# Patient Record
Sex: Female | Born: 1960 | Race: Black or African American | Hispanic: No | Marital: Single | Smoking: Former smoker
Health system: Southern US, Community
[De-identification: ages and names within clinical notes are randomized; demographics above are authoritative.]

## PROBLEM LIST (undated history)

## (undated) DIAGNOSIS — Z789 Other specified health status: Secondary | ICD-10-CM

## (undated) DIAGNOSIS — B20 Human immunodeficiency virus [HIV] disease: Secondary | ICD-10-CM

## (undated) DIAGNOSIS — Z87891 Personal history of nicotine dependence: Secondary | ICD-10-CM

## (undated) DIAGNOSIS — Z21 Asymptomatic human immunodeficiency virus [HIV] infection status: Secondary | ICD-10-CM

## (undated) HISTORY — PX: TUBAL LIGATION: SHX77

---

## 2000-05-03 ENCOUNTER — Encounter: Admission: RE | Admit: 2000-05-03 | Discharge: 2000-05-03 | Payer: Self-pay | Admitting: Internal Medicine

## 2002-06-08 ENCOUNTER — Encounter: Admission: RE | Admit: 2002-06-08 | Discharge: 2002-06-08 | Payer: Self-pay | Admitting: Occupational Medicine

## 2002-06-08 ENCOUNTER — Encounter: Payer: Self-pay | Admitting: Occupational Medicine

## 2002-08-06 ENCOUNTER — Encounter: Admission: RE | Admit: 2002-08-06 | Discharge: 2002-10-09 | Payer: Self-pay | Admitting: *Deleted

## 2002-10-16 ENCOUNTER — Encounter: Admission: RE | Admit: 2002-10-16 | Discharge: 2002-11-02 | Payer: Self-pay | Admitting: Orthopedic Surgery

## 2002-12-10 ENCOUNTER — Emergency Department (HOSPITAL_COMMUNITY): Admission: EM | Admit: 2002-12-10 | Discharge: 2002-12-10 | Payer: Self-pay | Admitting: Emergency Medicine

## 2003-01-22 ENCOUNTER — Other Ambulatory Visit: Admission: RE | Admit: 2003-01-22 | Discharge: 2003-01-22 | Payer: Self-pay | Admitting: Family Medicine

## 2003-01-22 ENCOUNTER — Encounter: Admission: RE | Admit: 2003-01-22 | Discharge: 2003-01-22 | Payer: Self-pay | Admitting: Sports Medicine

## 2003-01-29 ENCOUNTER — Encounter: Admission: RE | Admit: 2003-01-29 | Discharge: 2003-01-29 | Payer: Self-pay | Admitting: Family Medicine

## 2003-02-14 ENCOUNTER — Encounter: Payer: Self-pay | Admitting: Sports Medicine

## 2003-02-14 ENCOUNTER — Encounter: Admission: RE | Admit: 2003-02-14 | Discharge: 2003-02-14 | Payer: Self-pay | Admitting: Sports Medicine

## 2003-03-28 ENCOUNTER — Encounter: Admission: RE | Admit: 2003-03-28 | Discharge: 2003-03-28 | Payer: Self-pay | Admitting: Family Medicine

## 2003-12-19 ENCOUNTER — Ambulatory Visit (HOSPITAL_COMMUNITY): Admission: RE | Admit: 2003-12-19 | Discharge: 2003-12-19 | Payer: Self-pay | Admitting: Family Medicine

## 2003-12-19 ENCOUNTER — Encounter: Admission: RE | Admit: 2003-12-19 | Discharge: 2003-12-19 | Payer: Self-pay | Admitting: Family Medicine

## 2004-01-02 ENCOUNTER — Encounter: Admission: RE | Admit: 2004-01-02 | Discharge: 2004-01-02 | Payer: Self-pay | Admitting: Sports Medicine

## 2004-01-17 ENCOUNTER — Encounter: Admission: RE | Admit: 2004-01-17 | Discharge: 2004-01-17 | Payer: Self-pay | Admitting: Family Medicine

## 2004-01-17 ENCOUNTER — Other Ambulatory Visit: Admission: RE | Admit: 2004-01-17 | Discharge: 2004-01-17 | Payer: Self-pay | Admitting: Family Medicine

## 2004-01-17 ENCOUNTER — Encounter (INDEPENDENT_AMBULATORY_CARE_PROVIDER_SITE_OTHER): Payer: Self-pay | Admitting: Specialist

## 2004-03-03 ENCOUNTER — Encounter: Admission: RE | Admit: 2004-03-03 | Discharge: 2004-03-03 | Payer: Self-pay | Admitting: Family Medicine

## 2004-04-17 ENCOUNTER — Ambulatory Visit: Payer: Self-pay | Admitting: Family Medicine

## 2005-03-02 ENCOUNTER — Encounter (INDEPENDENT_AMBULATORY_CARE_PROVIDER_SITE_OTHER): Payer: Self-pay | Admitting: *Deleted

## 2005-03-02 LAB — CONVERTED CEMR LAB

## 2005-03-23 ENCOUNTER — Encounter: Admission: RE | Admit: 2005-03-23 | Discharge: 2005-03-23 | Payer: Self-pay | Admitting: Sports Medicine

## 2005-03-30 ENCOUNTER — Encounter (INDEPENDENT_AMBULATORY_CARE_PROVIDER_SITE_OTHER): Payer: Self-pay | Admitting: Specialist

## 2005-03-30 ENCOUNTER — Other Ambulatory Visit: Admission: RE | Admit: 2005-03-30 | Discharge: 2005-03-30 | Payer: Self-pay | Admitting: Family Medicine

## 2005-03-30 ENCOUNTER — Ambulatory Visit: Payer: Self-pay | Admitting: Family Medicine

## 2005-04-30 ENCOUNTER — Ambulatory Visit: Payer: Self-pay | Admitting: Family Medicine

## 2006-09-29 DIAGNOSIS — Z87891 Personal history of nicotine dependence: Secondary | ICD-10-CM

## 2006-09-30 ENCOUNTER — Encounter (INDEPENDENT_AMBULATORY_CARE_PROVIDER_SITE_OTHER): Payer: Self-pay | Admitting: *Deleted

## 2006-11-18 ENCOUNTER — Encounter (INDEPENDENT_AMBULATORY_CARE_PROVIDER_SITE_OTHER): Payer: Self-pay | Admitting: Family Medicine

## 2006-11-18 ENCOUNTER — Ambulatory Visit: Payer: Self-pay | Admitting: Family Medicine

## 2006-11-18 LAB — CONVERTED CEMR LAB
ALT: 9 units/L (ref 0–35)
AST: 10 units/L (ref 0–37)
Basophils Absolute: 0 10*3/uL (ref 0.0–0.1)
Basophils Relative: 1 % (ref 0–1)
CO2: 22 meq/L (ref 19–32)
Chloride: 107 meq/L (ref 96–112)
Eosinophils Relative: 2 % (ref 0–5)
H Pylori IgG: NEGATIVE
Lymphocytes Relative: 32 % (ref 12–46)
Neutro Abs: 5.5 10*3/uL (ref 1.7–7.7)
Platelets: 378 10*3/uL (ref 150–400)
RDW: 12.9 % (ref 11.5–14.0)
Sodium: 142 meq/L (ref 135–145)
TSH: 0.529 microintl units/mL (ref 0.350–5.50)
Total Bilirubin: 0.4 mg/dL (ref 0.3–1.2)
Total Protein: 7 g/dL (ref 6.0–8.3)

## 2006-11-21 ENCOUNTER — Telehealth: Payer: Self-pay | Admitting: *Deleted

## 2006-11-22 ENCOUNTER — Encounter: Admission: RE | Admit: 2006-11-22 | Discharge: 2006-11-22 | Payer: Self-pay | Admitting: Sports Medicine

## 2007-04-12 ENCOUNTER — Telehealth: Payer: Self-pay | Admitting: *Deleted

## 2007-05-02 ENCOUNTER — Encounter: Payer: Self-pay | Admitting: Family Medicine

## 2007-05-02 ENCOUNTER — Ambulatory Visit: Payer: Self-pay | Admitting: Family Medicine

## 2007-05-02 LAB — CONVERTED CEMR LAB
ALT: 10 units/L (ref 0–35)
AST: 11 units/L (ref 0–37)
Alkaline Phosphatase: 39 units/L (ref 39–117)
BUN: 7 mg/dL (ref 6–23)
Chloride: 106 meq/L (ref 96–112)
Creatinine, Ser: 0.77 mg/dL (ref 0.40–1.20)
HCT: 38.1 % (ref 36.0–46.0)
MCHC: 34.1 g/dL (ref 30.0–36.0)
Platelets: 364 10*3/uL (ref 150–400)
RDW: 12.8 % (ref 11.5–14.0)
Total Bilirubin: 0.5 mg/dL (ref 0.3–1.2)

## 2007-05-08 ENCOUNTER — Encounter: Admission: RE | Admit: 2007-05-08 | Discharge: 2007-05-08 | Payer: Self-pay | Admitting: Family Medicine

## 2008-04-08 ENCOUNTER — Emergency Department (HOSPITAL_COMMUNITY): Admission: EM | Admit: 2008-04-08 | Discharge: 2008-04-08 | Payer: Self-pay | Admitting: Family Medicine

## 2009-10-03 ENCOUNTER — Ambulatory Visit: Payer: Self-pay | Admitting: Family Medicine

## 2009-10-03 ENCOUNTER — Ambulatory Visit (HOSPITAL_COMMUNITY): Admission: RE | Admit: 2009-10-03 | Discharge: 2009-10-03 | Payer: Self-pay | Admitting: Family Medicine

## 2009-10-03 ENCOUNTER — Encounter: Payer: Self-pay | Admitting: Family Medicine

## 2009-10-03 DIAGNOSIS — K117 Disturbances of salivary secretion: Secondary | ICD-10-CM

## 2009-10-03 DIAGNOSIS — R002 Palpitations: Secondary | ICD-10-CM | POA: Insufficient documentation

## 2009-10-03 LAB — CONVERTED CEMR LAB
ALT: 10 units/L (ref 0–35)
AST: 11 units/L (ref 0–37)
BUN: 9 mg/dL (ref 6–23)
Calcium: 8.7 mg/dL (ref 8.4–10.5)
Creatinine, Ser: 0.68 mg/dL (ref 0.40–1.20)
HCT: 38.9 % (ref 36.0–46.0)
HDL: 50 mg/dL (ref >39)
Hemoglobin: 12.8 g/dL (ref 12.0–15.0)
MCHC: 32.9 g/dL (ref 30.0–36.0)
MCV: 91.7 fL (ref 78.0–100.0)
Platelets: 320 10*3/uL (ref 150–400)
RDW: 12.7 % (ref 11.5–15.5)
TSH: 0.372 microintl units/mL (ref 0.350–4.500)
Total Bilirubin: 0.5 mg/dL (ref 0.3–1.2)
Total CHOL/HDL Ratio: 2.8
VLDL: 17 mg/dL (ref 0–40)

## 2009-10-06 ENCOUNTER — Ambulatory Visit: Payer: Self-pay | Admitting: Family Medicine

## 2009-10-07 ENCOUNTER — Ambulatory Visit: Payer: Self-pay | Admitting: Family Medicine

## 2009-10-07 ENCOUNTER — Encounter: Payer: Self-pay | Admitting: Family Medicine

## 2009-10-13 ENCOUNTER — Ambulatory Visit: Payer: Self-pay | Admitting: Family Medicine

## 2009-10-13 DIAGNOSIS — F438 Other reactions to severe stress: Secondary | ICD-10-CM

## 2009-10-13 DIAGNOSIS — F4389 Other reactions to severe stress: Secondary | ICD-10-CM | POA: Insufficient documentation

## 2009-11-14 ENCOUNTER — Other Ambulatory Visit: Admission: RE | Admit: 2009-11-14 | Discharge: 2009-11-14 | Payer: Self-pay | Admitting: Family Medicine

## 2009-11-14 ENCOUNTER — Ambulatory Visit: Payer: Self-pay | Admitting: Family Medicine

## 2009-11-14 ENCOUNTER — Encounter: Admission: RE | Admit: 2009-11-14 | Discharge: 2009-11-14 | Payer: Self-pay | Admitting: Family Medicine

## 2009-11-19 ENCOUNTER — Encounter: Payer: Self-pay | Admitting: Family Medicine

## 2009-11-19 LAB — CONVERTED CEMR LAB: Pap Smear: NEGATIVE

## 2009-12-12 ENCOUNTER — Encounter: Admission: RE | Admit: 2009-12-12 | Discharge: 2009-12-12 | Payer: Self-pay | Admitting: Family Medicine

## 2010-08-23 ENCOUNTER — Encounter: Payer: Self-pay | Admitting: Family Medicine

## 2010-09-01 NOTE — Assessment & Plan Note (Signed)
Summary: f/u Holter monitor/eo   Vital Signs:  Anita Hoffman profile:   50 year old female Weight:      203.4 pounds Pulse rate:   90 / minute BP sitting:   123 / 84  (right arm)  Vitals Entered By: Renato Battles slade,cma CC: f/up holter monitor and labs Is Anita Hoffman Diabetic? No Pain Assessment Anita Hoffman in pain? no        CC:  f/up holter monitor and labs.  History of Present Illness: Pt here for follow up of last visit for which she was having heart palpitations.  Reports no further episodes, but wore holtor monitor device as instructed with pending results.  Admits to increased stress in home with 3 year old daughter who has one year old and recently discovered her oldest daughter whom also lives at home is pregnant.  Currently works full time at [employer], admits to financial strains and recently having a lot of car trouble.  Feels there has been a loss of support system since fathers death a few years ago.  Anita Hoffman tearful when discussing stressors at home.  Habits & Providers  Alcohol-Tobacco-Diet     Tobacco Status: quit > 6 months  Current Medications (verified): 1)  None  Allergies (verified): No Known Drug Allergies  Past History:  Past Medical History: Reviewed history from 10/03/2009 and no changes required. ankle fracture 5/04, H. pylori neg 5/05,  R intramammary LN, benign  Past Surgical History: Reviewed history from 09/29/2006 and no changes required. Tubal ligation -  Family History: Reviewed history from 10/03/2009 and no changes required. Gma had cervical ca.  No breast or colon ca., No HTN, CAD Sister-Thyroid diseaase Grandmother-DM  Social History: Lives alone with children and grandchild.  One consistant sexual  partner.  FOB's not involved.  Supportive family.  Works for [employer].  Social EtOH.  Quit smoking 1995.  No rec drugs.Smoking Status:  quit > 6 months  Review of Systems General:  Denies loss of appetite, sleep disorder,  weakness, and weight loss. CV:  Denies chest pain or discomfort, difficulty breathing at night, difficulty breathing while lying down, fatigue, lightheadness, palpitations, shortness of breath with exertion, swelling of feet, and swelling of hands. Resp:  Denies cough, shortness of breath, and sputum productive. Psych:  Complains of anxiety and easily tearful; denies mental problems, panic attacks, sense of great danger, suicidal thoughts/plans, and thoughts /plans of harming others.  Physical Exam  General:  In no acute distress; alert,appropriate and cooperative throughout examination Neck:  No deformities, masses, or tenderness noted. Lungs:  Normal respiratory effort, chest expands symmetrically. Lungs are clear to auscultation, no crackles or wheezes. Heart:  Normal rate and regular rhythm. S1 and S2 normal without gallop, murmur, click, rub or other extra sounds. Pulses:  R and L carotid,radial,femoral,dorsalis pedis and posterior tibial pulses are full and equal bilaterally Extremities:  no edema Psych:  Cognition and judgment appear intact. Alert and cooperative with normal attention span and concentration.    Impression & Recommendations:  Problem # 1:  ANXIETY, SITUATIONAL (ICD-308.3)  Given normal lab, ekg and physical exam inclined to believe stress as possible etiology of previous episode of heart palpitations.  Continue however to await holter monitor report.  Discussed and counseled on stress management.  Instructed to return if symptoms return with chest pain, sob and persist.  Orders: [facility]- Est Level  3 (69629)  Orders: [facility]- Est Level  3 (52841)  Problem # 2:  PALPITATIONS, RECURRENT (ICD-785.1)  No episodes  reported since last vist, Holter monitor without ectopy or reported events.  Orders: [facility]- Est Level  3 (35573)  Anita Hoffman Instructions: 1)  Lab results and ekg from previous visit were normal. 2)  Still awaiting Holtor monitor report, we will call and/or mail  result if abnormal. 3)  Please schedule appointment for pap and mammogram. 4)  Relaxation, rest and exercise for stress/anxiety.  Return if symptoms return or worsen.   Prevention & Chronic Care Immunizations   Influenza vaccine: Not documented    Tetanus booster: 01/01/2003: Done.    Pneumococcal vaccine: Not documented  Other Screening   Pap smear: Done.  (03/02/2005)    Mammogram: Done.  (03/02/2005)   Smoking status: quit > 6 months  (10/13/2009)  Lipids   Total Cholesterol: 139  (10/03/2009)   LDL: 72  (10/03/2009)   LDL Direct: Not documented   HDL: 50  (10/03/2009)   Triglycerides: 86  (10/03/2009)      MISC. Report  Procedure date:  10/08/2009  Findings:      Holter monitor-no ectopy, no events

## 2010-09-01 NOTE — Assessment & Plan Note (Signed)
Summary: CPE WITH PAP/KH   Vital Signs:  Patient profile:   50 year old female Height:      62 inches Weight:      201 pounds BMI:     36.90 Temp:     97.7 degrees F oral Pulse rate:   77 / minute BP sitting:   116 / 81  (left arm) Cuff size:   large  Vitals Entered By: Tessie Fass CMA (November 14, 2009 8:56 AM) CC: complete physical with pap Is Patient Diabetic? No Pain Assessment Patient in pain? no        CC:  complete physical with pap.  History of Present Illness: Patient for CPE and Pap.  States mammogram done this am.  Denies complaints at current.  Last visit expressed stress and anxiety which she reports is improved with relaxation and stress relieving measures.  Reports she has began to walk most days a week for exercise. No further episodes of palpitations.   Habits & Providers  Alcohol-Tobacco-Diet     Tobacco Status: quit  Allergies: No Known Drug Allergies  Past History:  Past Medical History: Reviewed history from 10/03/2009 and no changes required. ankle fracture 5/04, H. pylori neg 5/05,  R intramammary LN, benign  Past Surgical History: Reviewed history from 09/29/2006 and no changes required. Tubal ligation -  Family History: Gma had cervical ca.  No breast or colon ca., No HTN, CAD Sister-Thyroid diseaase Grandmother-DM Father-Lung Ca, deceased at 28  Social History: Lives with children and grandchild.  One consistant sexual  partner. FOB's not involved.  Supportive family.  Works for [employer].  Social EtOH.  Quit smoking 1995.  No rec drugs.  Walks for exercise 3-4 days/wkSmoking Status:  quit  Review of Systems General:  Denies chills, fever, loss of appetite, malaise, and sweats. Eyes:  Denies double vision, eye pain, and light sensitivity. ENT:  Denies decreased hearing, earache, nasal congestion, sinus pressure, and sore throat. CV:  Denies chest pain or discomfort, difficulty breathing at night, fatigue, lightheadness,  palpitations, and shortness of breath with exertion. Resp:  Denies chest discomfort, cough, shortness of breath, and sputum productive. GI:  Denies abdominal pain, bloody stools, change in bowel habits, constipation, dark tarry stools, indigestion, nausea, and vomiting. GU:  Denies abnormal vaginal bleeding, discharge, dysuria, urinary frequency, and urinary hesitancy. MS:  Denies joint pain, muscle aches, and muscle weakness. Derm:  Denies changes in color of skin and rash.  Physical Exam  General:  Well-developed,well-nourished,in no acute distress; alert,appropriate and cooperative throughout examination Head:  Normocephalic and atraumatic without obvious abnormalities.  Eyes:  No corneal or conjunctival inflammation noted. EOMI. Perrla. Vision grossly normal. Ears:  External ear exam shows no significant lesions or deformities.  Otoscopic examination reveals clear canals, tympanic membranes are intact bilaterally without bulging, retraction, inflammation or discharge. Hearing is grossly normal bilaterally. Nose:  External nasal examination shows no deformity or inflammation. Nasal mucosa are pink and moist without lesions or exudates. Mouth:  Oral mucosa and oropharynx without lesions or exudates.   Neck:  No deformities, masses, or tenderness noted. Breasts:  skin/areolae normal, no masses, no abnormal thickening, no nipple discharge, and no tenderness.   Lungs:  Normal respiratory effort. Lungs are clear to auscultation, no crackles or wheezes. Heart:  Normal rate and regular rhythm. S1 and S2 normal without gallop, murmur, click, rub or other extra sounds. Abdomen:  Bowel sounds positive,abdomen soft and non-tender without masses, organomegaly or hernias noted. Genitalia:  Normal introitus  for age, no external lesions, no vaginal discharge, mucosa pink and moist, no vaginal or cervical lesions, no vaginal atrophy, no friaility or hemorrhage, normal uterus size and position, no adnexal  masses or tenderness Msk:  No deformity or scoliosis noted of thoracic or lumbar spine.   Pulses:  R and L carotid,radial,femoral,dorsalis pedis and posterior tibial pulses are full and equal bilaterally Extremities:  No clubbing, cyanosis, edema, or deformity noted with normal full range of motion of all joints.   Neurologic:  alert & oriented X3, cranial nerves II-XII intact, strength normal in all extremities, sensation intact to light touch, and gait normal.   Skin:  Intact without suspicious lesions or rashes Cervical Nodes:  No lymphadenopathy noted Axillary Nodes:  No palpable lymphadenopathy Inguinal Nodes:  No significant adenopathy Psych:  Cognition and judgment appear intact. Alert and cooperative with normal attention span and concentration. No apparent delusions, illusions, hallucinations    Impression & Recommendations:  Problem # 1:  EXAMINATION, ROUTINE MEDICAL (ICD-V70.0)  Orders: [facility] - Est  40-64 yrs (41660)  Problem # 2:  SCREENING FOR MALIGNANT NEOPLASM OF THE CERVIX (ICD-V76.2) Await results Pap smear. Orders: Pap Smear-[facility] ([id number]) [facility] - Est  40-64 yrs (23557)  Patient Instructions: 1)  Await Pap results.   Prevention & Chronic Care Immunizations   Influenza vaccine: Not documented    Tetanus booster: 01/01/2003: Done.    Pneumococcal vaccine: Not documented  Other Screening   Pap smear: Done.  (03/02/2005)    Mammogram: Done.  (03/02/2005)   Smoking status: quit  (11/14/2009)  Lipids   Total Cholesterol: 139  (10/03/2009)   LDL: 72  (10/03/2009)   LDL Direct: Not documented   HDL: 50  (10/03/2009)   Triglycerides: 86  (10/03/2009)

## 2010-09-01 NOTE — Assessment & Plan Note (Signed)
Summary: NEEDS HOLTER MONITOR/EO  Nurse Visit Holter moniter applied. Theresia Lo RN  October 06, 2009 4:53 PM    Allergies: No Known Drug Allergies  Orders Added: 1)  Holter Monitor- [facility] [[id number]] 2)  Est Level 1- [facility] [[id number]]

## 2010-09-01 NOTE — Assessment & Plan Note (Signed)
Summary: irregular heartbeats per pt/eo   Vital Signs:  Patient profile:   50 year old female Height:      62 inches Weight:      198 pounds BMI:     36.35 Temp:     98.8 degrees F oral Pulse rate:   81 / minute BP sitting:   115 / 77  (left arm) Cuff size:   large  Vitals Entered By: Tessie Fass CMA (October 03, 2009 10:07 AM) CC: irregular heartbeat Is Patient Diabetic? No Pain Assessment Patient in pain? no        CC:  irregular heartbeat.  History of Present Illness: Describes an irregular heart beat for over one year.  No previous history of such.  Presents as one-two beats that come and go.  She has a heart awareness but no chest pain.  She does get short of breath if she walks for 1-2 miles but recovers easliy.  Dry mouth is a problem, she denies polyuria or polyphagia.  Her sister was just diagnosed with Thyroid disease.  Diabetes in grandmother.  She works for [employer].  Has Medicaid.  Habits & Providers  Alcohol-Tobacco-Diet     Tobacco Status: quit  Current Medications (verified): 1)  None  Allergies (verified): No Known Drug Allergies  Past History:  Past Surgical History: Last updated: 09/29/2006 Tubal ligation -  Family History: Last updated: 10/03/2009 Gma had cervical ca.  No breast or colon ca., No HTN, CAD Sister-Thyroid diseaase Grandmother-DM  Social History: Last updated: 10/03/2009 Lives alone with children.  One consistant sexual  partner.  FOB's not involved.  Supportive family.  Works for [employer].  Social EtOH.  Quit smoking 1995.  No rec drugs.  Risk Factors: Smoking Status: quit (10/03/2009)  Past Medical History: ankle fracture 5/04, H. pylori neg 5/05,  R intramammary LN, benign  Family History: Gma had cervical ca.  No breast or colon ca., No HTN, CAD Sister-Thyroid diseaase Grandmother-DM  Social History: Lives alone with children.  One consistant sexual  partner.  FOB's not involved.  Supportive  family.  Works for [employer].  Social EtOH.  Quit smoking 1995.  No rec drugs.  Review of Systems General:  Denies fatigue, malaise, sleep disorder, sweats, and weight loss. CV:  Complains of shortness of breath with exertion; denies chest pain or discomfort, difficulty breathing while lying down, lightheadness, near fainting, and swelling of feet. Resp:  Denies cough and wheezing.  Physical Exam  General:  alert and overweight-appearing.   Neck:  No deformities, masses, or tenderness noted. Lungs:  normal respiratory effort and normal breath sounds.   Heart:  normal rate, regular rhythm, and no murmur.  EKG with NSR, no ectopy Abdomen:  soft, non-tender, normal bowel sounds, no distention, and no masses.   Extremities:  No clubbing, cyanosis, edema, or deformity noted with normal full range of motion of all joints.     Impression & Recommendations:  Problem # 1:  PALPITATIONS, RECURRENT (ICD-785.1) Check labs, Holter Monitor on Monday 3/7, follow up in 2 weeks Orders: 12 Lead EKG (12 Lead EKG) Lipid-[facility] ([id number]) Comp Met-[facility] (09811-91478) CBC-[facility] (29562) TSH-[facility] ([id number]) [facility]- Est  Level 4 (96295)  Problem # 2:  DRY MOUTH (ICD-527.7) Check for DM, increase water intake  Patient Instructions: 1)  Return on Monday as soon as you can after work for a nurse visit to place Holter monitor. 2)  Please schedule a follow-up appointment in 2 weeks with Rossi Silvestro.Marland Kitchen

## 2010-09-01 NOTE — Assessment & Plan Note (Signed)
Summary: REMOVAL HOLTER MONITOR/[facility]  Nurse Visit Holter monitor removed. On right upper sternum area where tape was placed there is a superfical area 1" by 1/8 '  vertical  abrasion . on left side of sternum reddened area, skin does not appear open.  washed area well with soap and water and dried. applied antibiotic ointment. gave instructions for care of skin that is abrased. advised to call if not healing or appears to becoming infected. will send  message to Luretha Murphy . Theresia Lo RN  October 07, 2009 4:38 PM   Allergies: No Known Drug Allergies  Appended Document: REMOVAL HOLTER MONITOR/[facility]

## 2010-09-01 NOTE — Letter (Signed)
Summary: Generic Letter  [facility]  [address]   [place], [place] [address]   Phone: [phone]  Fax: [phone]    11/19/2009  MORIA BROPHY [address] [place], [place]  [address]  Dear Ms. Mayford Knife,  You PAP smear results were normal.  However, an infection was detected on the testing that will need to be treated.  Please call our office for further details.     Sincerely,   Luretha Murphy NP

## 2013-11-02 ENCOUNTER — Other Ambulatory Visit: Payer: Self-pay

## 2014-06-18 ENCOUNTER — Other Ambulatory Visit (HOSPITAL_COMMUNITY)
Admission: RE | Admit: 2014-06-18 | Discharge: 2014-06-18 | Disposition: A | Discharge status: Home / Self Care | Payer: BC Managed Care – PPO | Source: Ambulatory Visit | Attending: Nurse Practitioner | Admitting: Nurse Practitioner

## 2014-06-18 ENCOUNTER — Other Ambulatory Visit: Payer: Self-pay | Admitting: Nurse Practitioner

## 2014-06-18 DIAGNOSIS — Z113 Encounter for screening for infections with a predominantly sexual mode of transmission: Secondary | ICD-10-CM | POA: Insufficient documentation

## 2014-06-18 DIAGNOSIS — Z01419 Encounter for gynecological examination (general) (routine) without abnormal findings: Secondary | ICD-10-CM | POA: Insufficient documentation

## 2014-06-18 DIAGNOSIS — Z1151 Encounter for screening for human papillomavirus (HPV): Secondary | ICD-10-CM | POA: Insufficient documentation

## 2014-06-19 LAB — CYTOLOGY - PAP

## 2019-11-28 ENCOUNTER — Emergency Department (HOSPITAL_COMMUNITY): Payer: Self-pay

## 2019-11-28 ENCOUNTER — Inpatient Hospital Stay (HOSPITAL_COMMUNITY)
Admission: EM | Admit: 2019-11-28 | Discharge: 2019-12-06 | DRG: 167 | Disposition: A | Discharge status: Home / Self Care | Payer: Self-pay | Attending: Internal Medicine | Admitting: Internal Medicine

## 2019-11-28 ENCOUNTER — Encounter (HOSPITAL_COMMUNITY): Payer: Self-pay | Admitting: *Deleted

## 2019-11-28 DIAGNOSIS — C772 Secondary and unspecified malignant neoplasm of intra-abdominal lymph nodes: Secondary | ICD-10-CM | POA: Diagnosis present

## 2019-11-28 DIAGNOSIS — D6869 Other thrombophilia: Secondary | ICD-10-CM | POA: Diagnosis present

## 2019-11-28 DIAGNOSIS — C799 Secondary malignant neoplasm of unspecified site: Secondary | ICD-10-CM

## 2019-11-28 DIAGNOSIS — D122 Benign neoplasm of ascending colon: Secondary | ICD-10-CM | POA: Diagnosis present

## 2019-11-28 DIAGNOSIS — Z20822 Contact with and (suspected) exposure to covid-19: Secondary | ICD-10-CM | POA: Diagnosis present

## 2019-11-28 DIAGNOSIS — D125 Benign neoplasm of sigmoid colon: Secondary | ICD-10-CM | POA: Diagnosis present

## 2019-11-28 DIAGNOSIS — C787 Secondary malignant neoplasm of liver and intrahepatic bile duct: Secondary | ICD-10-CM | POA: Diagnosis present

## 2019-11-28 DIAGNOSIS — R778 Other specified abnormalities of plasma proteins: Secondary | ICD-10-CM | POA: Diagnosis present

## 2019-11-28 DIAGNOSIS — B20 Human immunodeficiency virus [HIV] disease: Secondary | ICD-10-CM

## 2019-11-28 DIAGNOSIS — K8020 Calculus of gallbladder without cholecystitis without obstruction: Secondary | ICD-10-CM | POA: Diagnosis present

## 2019-11-28 DIAGNOSIS — Z21 Asymptomatic human immunodeficiency virus [HIV] infection status: Secondary | ICD-10-CM | POA: Diagnosis present

## 2019-11-28 DIAGNOSIS — F1290 Cannabis use, unspecified, uncomplicated: Secondary | ICD-10-CM | POA: Diagnosis present

## 2019-11-28 DIAGNOSIS — K5900 Constipation, unspecified: Secondary | ICD-10-CM | POA: Diagnosis present

## 2019-11-28 DIAGNOSIS — E669 Obesity, unspecified: Secondary | ICD-10-CM | POA: Diagnosis present

## 2019-11-28 DIAGNOSIS — Z6832 Body mass index (BMI) 32.0-32.9, adult: Secondary | ICD-10-CM

## 2019-11-28 DIAGNOSIS — Z87891 Personal history of nicotine dependence: Secondary | ICD-10-CM

## 2019-11-28 DIAGNOSIS — F418 Other specified anxiety disorders: Secondary | ICD-10-CM | POA: Diagnosis present

## 2019-11-28 DIAGNOSIS — C801 Malignant (primary) neoplasm, unspecified: Secondary | ICD-10-CM

## 2019-11-28 DIAGNOSIS — Z8041 Family history of malignant neoplasm of ovary: Secondary | ICD-10-CM

## 2019-11-28 DIAGNOSIS — Z801 Family history of malignant neoplasm of trachea, bronchus and lung: Secondary | ICD-10-CM

## 2019-11-28 DIAGNOSIS — R1011 Right upper quadrant pain: Secondary | ICD-10-CM

## 2019-11-28 DIAGNOSIS — I82462 Acute embolism and thrombosis of left calf muscular vein: Secondary | ICD-10-CM | POA: Diagnosis present

## 2019-11-28 DIAGNOSIS — C19 Malignant neoplasm of rectosigmoid junction: Secondary | ICD-10-CM | POA: Diagnosis present

## 2019-11-28 DIAGNOSIS — D490 Neoplasm of unspecified behavior of digestive system: Secondary | ICD-10-CM

## 2019-11-28 DIAGNOSIS — I2699 Other pulmonary embolism without acute cor pulmonale: Secondary | ICD-10-CM | POA: Diagnosis present

## 2019-11-28 DIAGNOSIS — D509 Iron deficiency anemia, unspecified: Secondary | ICD-10-CM | POA: Diagnosis present

## 2019-11-28 DIAGNOSIS — I2609 Other pulmonary embolism with acute cor pulmonale: Principal | ICD-10-CM | POA: Diagnosis present

## 2019-11-28 HISTORY — DX: Personal history of nicotine dependence: Z87.891

## 2019-11-28 HISTORY — DX: Other specified health status: Z78.9

## 2019-11-28 HISTORY — DX: Human immunodeficiency virus (HIV) disease: B20

## 2019-11-28 HISTORY — DX: Asymptomatic human immunodeficiency virus (hiv) infection status: Z21

## 2019-11-28 LAB — BASIC METABOLIC PANEL
Anion gap: 15 (ref 5–15)
BUN: 6 mg/dL (ref 6–20)
CO2: 24 mmol/L (ref 22–32)
Calcium: 8.4 mg/dL — ABNORMAL LOW (ref 8.9–10.3)
Chloride: 94 mmol/L — ABNORMAL LOW (ref 98–111)
Creatinine, Ser: 0.77 mg/dL (ref 0.44–1.00)
GFR calc Af Amer: >60 mL/min (ref >60)
GFR calc non Af Amer: >60 mL/min (ref >60)
Glucose, Bld: 129 mg/dL — ABNORMAL HIGH (ref 70–99)
Potassium: 3.1 mmol/L — ABNORMAL LOW (ref 3.5–5.1)
Sodium: 133 mmol/L — ABNORMAL LOW (ref 135–145)

## 2019-11-28 LAB — CBC
HCT: 28.4 % — ABNORMAL LOW (ref 36.0–46.0)
Hemoglobin: 8.6 g/dL — ABNORMAL LOW (ref 12.0–15.0)
MCH: 23.8 pg — ABNORMAL LOW (ref 26.0–34.0)
MCHC: 30.3 g/dL (ref 30.0–36.0)
MCV: 78.5 fL — ABNORMAL LOW (ref 80.0–100.0)
Platelets: 467 10*3/uL — ABNORMAL HIGH (ref 150–400)
RBC: 3.62 MIL/uL — ABNORMAL LOW (ref 3.87–5.11)
RDW: 15.2 % (ref 11.5–15.5)
WBC: 10.7 10*3/uL — ABNORMAL HIGH (ref 4.0–10.5)
nRBC: 0 % (ref 0.0–0.2)

## 2019-11-28 LAB — TROPONIN I (HIGH SENSITIVITY): Troponin I (High Sensitivity): 13 ng/L (ref <18)

## 2019-11-28 LAB — I-STAT BETA HCG BLOOD, ED (MC, WL, AP ONLY): I-stat hCG, quantitative: <5 m[IU]/mL (ref <5)

## 2019-11-28 MED ORDER — SODIUM CHLORIDE 0.9% FLUSH: 3.0000 mL | Freq: Once | INTRAVENOUS | Status: DC

## 2019-11-28 NOTE — ED Notes (Signed)
No answer x1 for vitals recheck 

## 2019-11-28 NOTE — ED Notes (Signed)
No answer x3

## 2019-11-28 NOTE — ED Notes (Signed)
No answer x2 for vitals recheck 

## 2019-11-28 NOTE — ED Triage Notes (Signed)
To ED for eval of right side cp since last week. States pain has been constant since last week. Nausea today after taking a pain pill pta. States she is very tired and weak since pain started. Difficulty sleeping due to pain. Pt appears pale. Complains of 'a little' sob.

## 2019-11-29 ENCOUNTER — Inpatient Hospital Stay (HOSPITAL_COMMUNITY): Payer: Self-pay

## 2019-11-29 ENCOUNTER — Emergency Department (HOSPITAL_COMMUNITY): Payer: Self-pay

## 2019-11-29 ENCOUNTER — Encounter (HOSPITAL_COMMUNITY): Payer: Self-pay | Admitting: Internal Medicine

## 2019-11-29 ENCOUNTER — Other Ambulatory Visit: Payer: Self-pay

## 2019-11-29 DIAGNOSIS — I2699 Other pulmonary embolism without acute cor pulmonale: Secondary | ICD-10-CM

## 2019-11-29 DIAGNOSIS — R778 Other specified abnormalities of plasma proteins: Secondary | ICD-10-CM | POA: Diagnosis present

## 2019-11-29 DIAGNOSIS — D490 Neoplasm of unspecified behavior of digestive system: Secondary | ICD-10-CM | POA: Diagnosis present

## 2019-11-29 DIAGNOSIS — I2609 Other pulmonary embolism with acute cor pulmonale: Secondary | ICD-10-CM

## 2019-11-29 DIAGNOSIS — C787 Secondary malignant neoplasm of liver and intrahepatic bile duct: Secondary | ICD-10-CM | POA: Diagnosis present

## 2019-11-29 LAB — COMPREHENSIVE METABOLIC PANEL
ALT: 21 U/L (ref 0–44)
AST: 56 U/L — ABNORMAL HIGH (ref 15–41)
Albumin: 2.5 g/dL — ABNORMAL LOW (ref 3.5–5.0)
Alkaline Phosphatase: 176 U/L — ABNORMAL HIGH (ref 38–126)
Anion gap: 15 (ref 5–15)
BUN: 7 mg/dL (ref 6–20)
CO2: 21 mmol/L — ABNORMAL LOW (ref 22–32)
Calcium: 8.1 mg/dL — ABNORMAL LOW (ref 8.9–10.3)
Chloride: 97 mmol/L — ABNORMAL LOW (ref 98–111)
Creatinine, Ser: 0.61 mg/dL (ref 0.44–1.00)
GFR calc Af Amer: >60 mL/min (ref >60)
GFR calc non Af Amer: >60 mL/min (ref >60)
Glucose, Bld: 100 mg/dL — ABNORMAL HIGH (ref 70–99)
Potassium: 3.8 mmol/L (ref 3.5–5.1)
Sodium: 133 mmol/L — ABNORMAL LOW (ref 135–145)
Total Bilirubin: 1.2 mg/dL (ref 0.3–1.2)
Total Protein: 7.2 g/dL (ref 6.5–8.1)

## 2019-11-29 LAB — CBC WITH DIFFERENTIAL/PLATELET
Abs Immature Granulocytes: 0.05 10*3/uL (ref 0.00–0.07)
Basophils Absolute: 0 10*3/uL (ref 0.0–0.1)
Basophils Relative: 0 %
Eosinophils Absolute: 0.1 10*3/uL (ref 0.0–0.5)
Eosinophils Relative: 1 %
HCT: 28.1 % — ABNORMAL LOW (ref 36.0–46.0)
Hemoglobin: 8.4 g/dL — ABNORMAL LOW (ref 12.0–15.0)
Immature Granulocytes: 1 %
Lymphocytes Relative: 22 %
Lymphs Abs: 2.5 10*3/uL (ref 0.7–4.0)
MCH: 24.1 pg — ABNORMAL LOW (ref 26.0–34.0)
MCHC: 29.9 g/dL — ABNORMAL LOW (ref 30.0–36.0)
MCV: 80.7 fL (ref 80.0–100.0)
Monocytes Absolute: 0.9 10*3/uL (ref 0.1–1.0)
Monocytes Relative: 8 %
Neutro Abs: 7.5 10*3/uL (ref 1.7–7.7)
Neutrophils Relative %: 68 %
Platelets: 464 10*3/uL — ABNORMAL HIGH (ref 150–400)
RBC: 3.48 MIL/uL — ABNORMAL LOW (ref 3.87–5.11)
RDW: 15.7 % — ABNORMAL HIGH (ref 11.5–15.5)
WBC: 11 10*3/uL — ABNORMAL HIGH (ref 4.0–10.5)
nRBC: 0 % (ref 0.0–0.2)

## 2019-11-29 LAB — ECHOCARDIOGRAM COMPLETE
Height: 62 in
Weight: 3360 oz

## 2019-11-29 LAB — HEPARIN LEVEL (UNFRACTIONATED): Heparin Unfractionated: 0.16 IU/mL — ABNORMAL LOW (ref 0.30–0.70)

## 2019-11-29 LAB — LIPASE, BLOOD: Lipase: 17 U/L (ref 11–51)

## 2019-11-29 LAB — HEPATIC FUNCTION PANEL
ALT: 24 U/L (ref 0–44)
AST: 59 U/L — ABNORMAL HIGH (ref 15–41)
Albumin: 2.6 g/dL — ABNORMAL LOW (ref 3.5–5.0)
Alkaline Phosphatase: 175 U/L — ABNORMAL HIGH (ref 38–126)
Bilirubin, Direct: 0.2 mg/dL (ref 0.0–0.2)
Indirect Bilirubin: 1 mg/dL — ABNORMAL HIGH (ref 0.3–0.9)
Total Bilirubin: 1.2 mg/dL (ref 0.3–1.2)
Total Protein: 7.6 g/dL (ref 6.5–8.1)

## 2019-11-29 LAB — TROPONIN I (HIGH SENSITIVITY): Troponin I (High Sensitivity): 25 ng/L — ABNORMAL HIGH (ref <18)

## 2019-11-29 LAB — HIV ANTIBODY (ROUTINE TESTING W REFLEX): HIV Screen 4th Generation wRfx: REACTIVE — AB

## 2019-11-29 LAB — RESPIRATORY PANEL BY RT PCR (FLU A&B, COVID)
Influenza A by PCR: NEGATIVE
Influenza B by PCR: NEGATIVE
SARS Coronavirus 2 by RT PCR: NEGATIVE

## 2019-11-29 MED ORDER — HEPARIN (PORCINE) 25000 UT/250ML-% IV SOLN
1750.0000 [IU]/h | INTRAVENOUS | Status: DC
  Administered 2019-11-29: 1200 [IU]/h via INTRAVENOUS
  Administered 2019-11-30: 1450 [IU]/h via INTRAVENOUS
  Administered 2019-11-30: 1750 [IU]/h via INTRAVENOUS
  Filled 2019-11-29 (×3): qty 250

## 2019-11-29 MED ORDER — HYDROMORPHONE HCL 1 MG/ML IJ SOLN: 0.5000 mg | INTRAMUSCULAR | Status: DC | PRN

## 2019-11-29 MED ORDER — HEPARIN BOLUS VIA INFUSION
4000.0000 [IU] | Freq: Once | INTRAVENOUS | Status: AC
  Administered 2019-11-29: 4000 [IU] via INTRAVENOUS
  Filled 2019-11-29: qty 4000

## 2019-11-29 MED ORDER — ONDANSETRON HCL 4 MG/2ML IJ SOLN
4.0000 mg | Freq: Once | INTRAMUSCULAR | Status: AC
  Administered 2019-11-29: 08:00:00 4 mg via INTRAVENOUS
  Filled 2019-11-29: qty 2

## 2019-11-29 MED ORDER — SODIUM CHLORIDE 0.9 % IV BOLUS
1000.0000 mL | Freq: Once | INTRAVENOUS | Status: AC
  Administered 2019-11-29: 1000 mL via INTRAVENOUS

## 2019-11-29 MED ORDER — IOHEXOL 300 MG/ML  SOLN
100.0000 mL | Freq: Once | INTRAMUSCULAR | Status: AC | PRN
  Administered 2019-11-29: 100 mL via INTRAVENOUS

## 2019-11-29 MED ORDER — ONDANSETRON HCL 4 MG PO TABS: 4.0000 mg | ORAL_TABLET | Freq: Four times a day (QID) | ORAL | Status: DC | PRN

## 2019-11-29 MED ORDER — SODIUM CHLORIDE 0.9 % IV SOLN: INTRAVENOUS | Status: DC

## 2019-11-29 MED ORDER — ACETAMINOPHEN 650 MG RE SUPP: 650.0000 mg | Freq: Four times a day (QID) | RECTAL | Status: DC | PRN

## 2019-11-29 MED ORDER — ONDANSETRON HCL 4 MG/2ML IJ SOLN: 4.0000 mg | Freq: Four times a day (QID) | INTRAMUSCULAR | Status: DC | PRN

## 2019-11-29 MED ORDER — HEPARIN BOLUS VIA INFUSION
2100.0000 [IU] | Freq: Once | INTRAVENOUS | Status: AC
  Administered 2019-11-29: 2100 [IU] via INTRAVENOUS
  Filled 2019-11-29: qty 2100

## 2019-11-29 MED ORDER — ACETAMINOPHEN 325 MG PO TABS
650.0000 mg | ORAL_TABLET | Freq: Four times a day (QID) | ORAL | Status: DC | PRN
  Administered 2019-12-01 – 2019-12-06 (×4): 650 mg via ORAL
  Filled 2019-11-29 (×4): qty 2

## 2019-11-29 MED ORDER — MORPHINE SULFATE (PF) 4 MG/ML IV SOLN
4.0000 mg | Freq: Once | INTRAVENOUS | Status: AC
  Administered 2019-11-29: 4 mg via INTRAVENOUS
  Filled 2019-11-29: qty 1

## 2019-11-29 NOTE — ED Notes (Signed)
Patient transported to US 

## 2019-11-29 NOTE — Progress Notes (Signed)
Patient received from the ED. Alert and oriented X4. Heparin drip infusing for bilateral PE's. Lower extremity ultrasound results noted. SCD's and ted hose placed on. VSS. Oriented patient to unit and procedures. Patient denies any pain. No acute needs voiced at this time, call bell in reach.

## 2019-11-29 NOTE — H&P (Addendum)
History and Physical    Anita Hoffman [id number] DOB: 1960-12-13 DOA: 11/28/2019  PCP: Patient, No Pcp Per  Patient coming from: Home I have personally briefly reviewed patient's old medical records in [place]  Chief Complaint: Right upper quadrant pain and shortness of breath since 3 weeks  HPI: Anita Hoffman is a 59 y.o. female with medical history significant of former smoker presents to emergency department due to worsening right upper quadrant pain and shortness of breath since 3 weeks.  Patient tells me that she has chronic right upper quadrant pain since many years however it got worse since 3 weeks associated with food intake and nausea.  Reports that her pain is nonradiating, cramping in nature.  She has noticed more than 30 pound weight loss in 1 month associated with night sweats.  Reports constipation since 3 weeks, no history of melena, fever, chills, pruritus, jaundice, decreased appetite.  Reports shortness of breath however denies association with leg swelling, orthopnea, PND, chest pain, wheezing, cough, congestion.  She does not have any medical issues and does not take any medications at home.  She is a former smoker and quit smoking 14 years ago, no history of alcohol, illicit drug use.  ED Course: Upon arrival to ED: Patient tachycardic: Heart rate in 103, tachypneic, respiratory rate in 20s, maintaining oxygen saturation on room air, afebrile.  Troponin: 25, lipase, hepatic panel, COVID-19: WNL.  CBC and CMP: Pending.  Right upper quadrant ultrasound shows moderate to severe cholelithiasis with mild gallbladder wall thickness of 5.5 mm.  Recommend clinical correlation as findings could be seen with acute cholecystitis.  Numerous liver masses with largest over the right lobe measuring 7.3 cm containing central cystic/necrotic areas.  Findings are highly suspicious for metastatic disease.  CT angio of the chest done which shows bilateral PE right more than  left.  CT abdomen shows: Large cecal mass extending into ileocecal valve region and terminal ileum measuring 5.6 x 4.0 x 4.5 cm consistent with colonic neoplasm. EDP consulted general surgery.  [facility] hospital[facility] consulted for admission for acute bilateral PE and metastatic cecal mass. Review of Systems: As per HPI otherwise negative.    Past Medical History:  Diagnosis Date  . Former smoker     History reviewed. No pertinent surgical history.  Former smoker, denies current smoking, alcohol, illicit drug use.  No Known Allergies  Family history: Positive for ovarian cancer in grandmom.  Prior to Admission medications   Not on File    Physical Exam: Vitals:   11/29/19 0930 11/29/19 1000 11/29/19 1030 11/29/19 1100  BP: 111/83 113/82 115/80 112/78  Pulse: 86 91 87 87  Resp: (!) 23 (!) 22 20 (!) 23  Temp:      TempSrc:      SpO2: 95% 93% 97% 93%  Weight:      Height:        Constitutional: NAD, calm, comfortable, communicating well, on room air Eyes: PERRL, lids and conjunctivae normal ENMT: Mucous membranes are moist. Posterior pharynx clear of any exudate or lesions.Normal dentition.  Neck: normal, supple, no masses, no thyromegaly Respiratory: clear to auscultation bilaterally, no wheezing, no crackles. Normal respiratory effort. No accessory muscle use.  Cardiovascular: Regular rate and rhythm, no murmurs / rubs / gallops. No extremity edema. 2+ pedal pulses. No carotid bruits.  Abdomen: Right upper quadrant tenderness positive, no masses palpated. No hepatosplenomegaly. Bowel sounds positive.  Musculoskeletal: no clubbing / cyanosis. No joint deformity upper and lower extremities.  Good ROM, no contractures. Normal muscle tone.  Skin: no rashes, lesions, ulcers. No induration Neurologic: CN 2-12 grossly intact. Sensation intact, DTR normal. Strength 5/5 in all 4.  Psychiatric: Normal judgment and insight. Alert and oriented x 3. Normal mood.    Labs on Admission: I  have personally reviewed following labs and imaging studies  CBC: Recent Labs  Lab 11/28/19 1309  WBC 10.7*  HGB 8.6*  HCT 28.4*  MCV 78.5*  PLT 0000000*   Basic Metabolic Panel: Recent Labs  Lab 11/28/19 1309  NA 133*  K 3.1*  CL 94*  CO2 24  GLUCOSE 129*  BUN 6  CREATININE 0.77  CALCIUM 8.4*   GFR: Estimated Creatinine Clearance: 83.5 mL/min (by C-G formula based on SCr of 0.77 mg/dL). Liver Function Tests: Recent Labs  Lab 11/29/19 0732  AST 59*  ALT 24  ALKPHOS 175*  BILITOT 1.2  PROT 7.6  ALBUMIN 2.6*   Recent Labs  Lab 11/29/19 0732  LIPASE 17   No results for input(s): AMMONIA in the last 168 hours. Coagulation Profile: No results for input(s): INR, PROTIME in the last 168 hours. Cardiac Enzymes: No results for input(s): CKTOTAL, CKMB, CKMBINDEX, TROPONINI in the last 168 hours. BNP (last 3 results) No results for input(s): PROBNP in the last 8760 hours. HbA1C: No results for input(s): HGBA1C in the last 72 hours. CBG: No results for input(s): GLUCAP in the last 168 hours. Lipid Profile: No results for input(s): CHOL, HDL, LDLCALC, TRIG, CHOLHDL, LDLDIRECT in the last 72 hours. Thyroid Function Tests: No results for input(s): TSH, T4TOTAL, FREET4, T3FREE, THYROIDAB in the last 72 hours. Anemia Panel: No results for input(s): VITAMINB12, FOLATE, FERRITIN, TIBC, IRON, RETICCTPCT in the last 72 hours. Urine analysis: No results found for: COLORURINE, APPEARANCEUR, LABSPEC, PHURINE, GLUCOSEU, HGBUR, BILIRUBINUR, KETONESUR, PROTEINUR, UROBILINOGEN, NITRITE, LEUKOCYTESUR  Radiological Exams on Admission: DG Chest 2 View  Result Date: 11/28/2019 CLINICAL DATA:  59 year old female with history of chest and abdominal pain for the past 2 weeks. EXAM: CHEST - 2 VIEW COMPARISON:  No priors. FINDINGS: Lung volumes are normal. No consolidative airspace disease. No pleural effusions. No pneumothorax. No pulmonary nodule or mass noted. Pulmonary vasculature and  the cardiomediastinal silhouette are within normal limits. IMPRESSION: No radiographic evidence of acute cardiopulmonary disease. Electronically Signed   By: Vinnie Langton M.D.   On: 11/28/2019 13:46   CT Angio Chest PE W/Cm &/Or Wo Cm  Result Date: 11/29/2019 CLINICAL DATA:  RIGHT upper quadrant and RIGHT lower chest pain, shortness of breath, lower extremity edema, former smoker EXAM: CT ANGIOGRAPHY CHEST CT ABDOMEN AND PELVIS WITH CONTRAST TECHNIQUE: Multidetector CT imaging of the chest was performed using the standard protocol during bolus administration of intravenous contrast. Multiplanar CT image reconstructions and MIPs were obtained to evaluate the vascular anatomy. Multidetector CT imaging of the abdomen and pelvis was performed using the standard protocol during bolus administration of intravenous contrast. CONTRAST:  133mL OMNIPAQUE IOHEXOL 300 MG/ML SOLN IV. No oral contrast. COMPARISON:  CT abdomen and pelvis 11/22/2006 FINDINGS: CTA CHEST FINDINGS Cardiovascular: Aorta normal caliber without aneurysm or dissection. Pulmonary arteries well opacified. Filling defects are seen within the pulmonary arteries bilaterally RIGHT greater than LEFT consistent with pulmonary emboli. These are greatest in the RIGHT lower lobe but affect all remaining lobes as well. RV/LV ratio = 1.0, elevated, consistent with RIGHT heart strain. Mediastinum/Nodes: Base of cervical region normal appearance. Normal sized axillary nodes bilaterally. No thoracic adenopathy. Esophagus unremarkable. Lungs/Pleura: Linear subsegmental  atelectasis LEFT lower lobe. Lungs otherwise clear. No pulmonary infiltrate, pleural effusion, or pneumothorax. Musculoskeletal: No acute osseous findings. Review of the MIP images confirms the above findings. CT ABDOMEN and PELVIS FINDINGS Hepatobiliary: Multiple poorly defined mass lesions are seen throughout the liver consistent with extensive hepatic metastatic disease. Largest of these measure  6.6 x 4.2 cm lateral segment LEFT lobe image 43, 5.7 x 5.7 cm posterior RIGHT lobe image 34, anterior RIGHT lobe 7.2 x 5.0 cm image 33, and 7.6 x 7.1 cm anterior RIGHT lobe image 22. Gallbladder unremarkable. Pancreas: Normal appearance Spleen: Normal appearance Adrenals/Urinary Tract: Adrenal glands normal appearance. BILATERAL renal cysts. Kidneys, ureters, and bladder otherwise normal appearance. Stomach/Bowel: Normal appendix. Large mass identified at cecum extending into ileocecal valve region and terminal ileum measuring 5.6 x 4.0 x 4.5 cm consistent with colonic neoplasm. Thickening of terminal ileum without obstruction. Stomach decompressed. Remaining large and small bowel loops unremarkable. Vascular/Lymphatic: Minimal atherosclerotic calcification aorta. Aorta normal caliber. Few pelvic phleboliths. Mesenteric mass with minimal calcification 4.1 x 2.8 cm image 54 likely nodal metastasis. Additional enlarged pathologic node mesentery medial to cecal tip 1.7 x 1.5 cm. Normal sized inguinal lymph nodes. Reproductive: Uterus and adnexa unremarkable Other: Tiny umbilical hernia containing fat. No free air or free fluid. Musculoskeletal: Bones demineralized. Lipoma involving the distal RIGHT iliopsoas muscle/tendon, 3.6 x 2.5 cm x 8.9 cm length. Review of the MIP images confirms the above findings. IMPRESSION: Multiple BILATERAL pulmonary emboli RIGHT greater than LEFT. Positive for acute PE with CT evidence of right heart strain (RV/LV Ratio = 1.0) consistent with at least submassive (intermediate risk) PE; the presence of right heart strain has been associated with an increased risk of morbidity and mortality. Large cecal mass extending into ileocecal valve region and terminal ileum measuring 5.6 x 4.0 x 4.5 cm consistent with colonic neoplasm. Multiple hepatic metastases. Mesenteric nodal metastases. Aortic Atherosclerosis (ICD10-I70.0). Critical Value/emergent results were called by telephone at the time of  interpretation on 11/29/2019 at 12:24 pm to provider DR. ELIZABETH REES , who verbally acknowledged these results. Electronically Signed   By: Lavonia Dana M.D.   On: 11/29/2019 12:27   CT Abdomen Pelvis W Contrast  Result Date: 11/29/2019 CLINICAL DATA:  RIGHT upper quadrant and RIGHT lower chest pain, shortness of breath, lower extremity edema, former smoker EXAM: CT ANGIOGRAPHY CHEST CT ABDOMEN AND PELVIS WITH CONTRAST TECHNIQUE: Multidetector CT imaging of the chest was performed using the standard protocol during bolus administration of intravenous contrast. Multiplanar CT image reconstructions and MIPs were obtained to evaluate the vascular anatomy. Multidetector CT imaging of the abdomen and pelvis was performed using the standard protocol during bolus administration of intravenous contrast. CONTRAST:  183mL OMNIPAQUE IOHEXOL 300 MG/ML SOLN IV. No oral contrast. COMPARISON:  CT abdomen and pelvis 11/22/2006 FINDINGS: CTA CHEST FINDINGS Cardiovascular: Aorta normal caliber without aneurysm or dissection. Pulmonary arteries well opacified. Filling defects are seen within the pulmonary arteries bilaterally RIGHT greater than LEFT consistent with pulmonary emboli. These are greatest in the RIGHT lower lobe but affect all remaining lobes as well. RV/LV ratio = 1.0, elevated, consistent with RIGHT heart strain. Mediastinum/Nodes: Base of cervical region normal appearance. Normal sized axillary nodes bilaterally. No thoracic adenopathy. Esophagus unremarkable. Lungs/Pleura: Linear subsegmental atelectasis LEFT lower lobe. Lungs otherwise clear. No pulmonary infiltrate, pleural effusion, or pneumothorax. Musculoskeletal: No acute osseous findings. Review of the MIP images confirms the above findings. CT ABDOMEN and PELVIS FINDINGS Hepatobiliary: Multiple poorly defined mass lesions are seen throughout  the liver consistent with extensive hepatic metastatic disease. Largest of these measure 6.6 x 4.2 cm lateral  segment LEFT lobe image 43, 5.7 x 5.7 cm posterior RIGHT lobe image 34, anterior RIGHT lobe 7.2 x 5.0 cm image 33, and 7.6 x 7.1 cm anterior RIGHT lobe image 22. Gallbladder unremarkable. Pancreas: Normal appearance Spleen: Normal appearance Adrenals/Urinary Tract: Adrenal glands normal appearance. BILATERAL renal cysts. Kidneys, ureters, and bladder otherwise normal appearance. Stomach/Bowel: Normal appendix. Large mass identified at cecum extending into ileocecal valve region and terminal ileum measuring 5.6 x 4.0 x 4.5 cm consistent with colonic neoplasm. Thickening of terminal ileum without obstruction. Stomach decompressed. Remaining large and small bowel loops unremarkable. Vascular/Lymphatic: Minimal atherosclerotic calcification aorta. Aorta normal caliber. Few pelvic phleboliths. Mesenteric mass with minimal calcification 4.1 x 2.8 cm image 54 likely nodal metastasis. Additional enlarged pathologic node mesentery medial to cecal tip 1.7 x 1.5 cm. Normal sized inguinal lymph nodes. Reproductive: Uterus and adnexa unremarkable Other: Tiny umbilical hernia containing fat. No free air or free fluid. Musculoskeletal: Bones demineralized. Lipoma involving the distal RIGHT iliopsoas muscle/tendon, 3.6 x 2.5 cm x 8.9 cm length. Review of the MIP images confirms the above findings. IMPRESSION: Multiple BILATERAL pulmonary emboli RIGHT greater than LEFT. Positive for acute PE with CT evidence of right heart strain (RV/LV Ratio = 1.0) consistent with at least submassive (intermediate risk) PE; the presence of right heart strain has been associated with an increased risk of morbidity and mortality. Large cecal mass extending into ileocecal valve region and terminal ileum measuring 5.6 x 4.0 x 4.5 cm consistent with colonic neoplasm. Multiple hepatic metastases. Mesenteric nodal metastases. Aortic Atherosclerosis (ICD10-I70.0). Critical Value/emergent results were called by telephone at the time of interpretation on  11/29/2019 at 12:24 pm to provider DR. ELIZABETH REES , who verbally acknowledged these results. Electronically Signed   By: Lavonia Dana M.D.   On: 11/29/2019 12:27   US Abdomen Limited RUQ  Result Date: 11/29/2019 CLINICAL DATA:  Right upper quadrant pain 3 months. EXAM: ULTRASOUND ABDOMEN LIMITED RIGHT UPPER QUADRANT COMPARISON:  05/08/2007 FINDINGS: Gallbladder: Significant cholelithiasis with largest stone measuring 1.1 cm. Gallbladder wall thickening measuring 5.5 mm. Negative sonographic Murphy sign. No adjacent free fluid. Common bile duct: Diameter: 5.7 mm. Liver: Heterogeneous parenchymal echogenicity with numerous liver masses many of which have a more hyperechoic periphery with more hypoechoic appearance centrally suggesting a somewhat target appearance. There is a large irregular shape cystic lesion with echogenic periphery over the right lobe measuring approximately 7.3 cm. Portal vein is patent on color Doppler imaging with normal direction of blood flow towards the liver. Other: None. IMPRESSION: 1. Moderate to severe cholelithiasis with mild gallbladder wall thickening of 5.5 mm. Recommend clinical correlation as findings could be seen with acute cholecystitis. 2. Numerous liver masses with the largest over the right lobe measuring 7.3 cm containing central cystic/necrotic area. Findings are highly suspicious for metastatic disease. Recommend clinical correlation as further evaluation with chest/abdominal/pelvic CT may be helpful to search for primary malignancy. Electronically Signed   By: Marin Olp M.D.   On: 11/29/2019 09:11    EKG: Independently reviewed.  Sinus tachycardia, nonspecific T wave abnormality.  Assessment/Plan Principal Problem:   Bilateral pulmonary embolism (HCC) Active Problems:   Cecal neoplasm   Liver metastases (HCC)   Elevated troponin   Acute bilateral pulmonary embolism:  -Likely secondary to underlying metastatic cecal mass -Patient tachycardic,  tachypneic, maintaining oxygen saturation on room air, afebrile.  COVID-19 negative -Reviewed CT abdomen/pelvis,  CT angio of chest and right upper quadrant ultrasound result -Patient started on IV heparin as per pharmacy -Admit patient at stepdown unit for close monitoring. -Continue IV heparin.  Monitor vitals closely.  On continuous pulse ox. -Troponin: 25.  Will trend troponin-no ACS symptoms-likely secondary to right heart strain secondary to acute bilateral PE.   -Will get transthoracic echo and Doppler ultrasound of bilateral lower extremity.  Metastatic cecal neoplasm: -Noted on CT abdomen/pelvis -Will will keep her n.p.o., Dilaudid as needed for pain control.  Start on gentle hydration.  Zofran as needed for nausea and vomiting.  Cholelithiasis/cholecystitis: -Patient is afebrile.  CBC and CMP: Pending -EDP consulted general surgery-await recommendations  Unintentional weight loss: -More than 30 pounds in 1 month -Due to metastatic cecal mass  Please note: General surgery recommended GI consult.  I talked to GI PA Sarah and discussed about the patient-she will come and see the patient tomorrow AM.  DVT prophylaxis: IV heparin, TED/SCD Code Status: Full code Family Communication: None present at bedside.  Plan of care discussed with patient in length and she verbalized understanding and agreed with it. Disposition Plan: To be determined Consults called: General surgery by EDP  admission status: Inpatient  Mckinley Jewel MD [facility] Pager [phone]  If 7PM-7AM, please contact night-coverage [email or web address] Password [facility]  11/29/2019, 2:05 PM

## 2019-11-29 NOTE — Progress Notes (Addendum)
ANTICOAGULATION CONSULT NOTE - Follow Up Consult  Pharmacy Consult for heparin Indication: pulmonary embolus  No Known Allergies  Patient Measurements: Height: 5\' 2"  (157.5 cm) Weight: 96.1 kg (211 lb 13.8 oz) IBW/kg (Calculated) : 50.1 Heparin Dosing Weight: 72.4 kg  Vital Signs: Temp: 98.9 F (37.2 C) (04/29 1947) Temp Source: Oral (04/29 1947) BP: 113/71 (04/29 1947) Pulse Rate: 87 (04/29 1947)  Labs: Recent Labs    11/28/19 1309 11/29/19 0757 11/29/19 1400 11/29/19 2009  HGB 8.6*  --  8.4*  --   HCT 28.4*  --  28.1*  --   PLT 467*  --  464*  --   HEPARINUNFRC  --   --   --  0.16*  CREATININE 0.77  --  0.61  --   TROPONINIHS 13 25*  --   --     Estimated Creatinine Clearance: 83.9 mL/min (by C-G formula based on SCr of 0.61 mg/dL).   Medical History: Past Medical History:  Diagnosis Date  . Former smoker   . Medical history non-contributory     Assessment: 59 yr old female presenting to the ED with CP and abdominal pain was found to have multiple bilateral PE's with right heart strain. 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 Pharmacy was consulted to start IV heparin. Pt was not on anticoagulation PTA.  Heparin level ~7 hrs after heparin 4000 units IV bolus, followed by heparin infusion at 1200 units/hr, was 0.16 units/ml, which is below the goal range for this pt. CBC stable. Per RN, no issues with IV or bleeding observed.  Goal of Therapy:  Heparin level 0.3-0.7 units/ml Monitor platelets by anticoagulation protocol: Yes   Plan:  Heparin 2100 units IV bolus X 1 Increase heparin infusion to 1450 units/hr Check 6-hr heparin level Monitor daily heparin level, CBC Monitor for signs/symptoms of bleeding  Gillermina Hu, PharmD, BCPS, [facility] Clinical Pharmacist 11/29/2019,8:51 PM

## 2019-11-29 NOTE — ED Notes (Signed)
Called for pt twice with no response

## 2019-11-29 NOTE — Progress Notes (Signed)
Lower extremity venous has been completed.   Preliminary results in CV Proc.   Abram Sander 11/29/2019 3:11 PM

## 2019-11-29 NOTE — Consult Note (Addendum)
[facility] Surgery Consult Note  Anita Hoffman [birth year]  BB:[id number].    Requesting MD: Doristine Bosworth, MD Chief Complaint/Reason for Consult: RUQ pain, cecal mass, cholelithiasis  HPI:  [name] is a 59 y/o Hoffman with a PMH tobacco use and tubal ligation who presented to the ED with a cc epigastric/RUQ abdominal pain along with SOB. She reports upper abdominal pain that started 3 weeks ago. She describes the pain as sharp, intermittent, non-radiating pain. Pain was slightly relieved by OTC pain medication. Associated sxs include one episode of emesis on the day of presentation to the ED. She also endorses about 3 weeks of constipation. For the past 3 weeks she has had one, nonbloody, liquid stool weekly. At baseline she reports having about 2 non-bloody, formed BMs weekly. Denies melena/hematochezia. She has never had a colonoscopy and denies any known history of colon cancer.   ROS: Review of Systems  Constitutional: Positive for diaphoresis (night sweats) and weight loss. Negative for chills and fever.  HENT: Negative.   Eyes: Negative.   Respiratory: Positive for shortness of breath. Negative for cough, hemoptysis and wheezing.   Cardiovascular: Positive for chest pain. Negative for palpitations, orthopnea, claudication, leg swelling and PND.  Gastrointestinal: Positive for abdominal pain, constipation, nausea and vomiting. Negative for blood in stool and melena.  Genitourinary: Negative.   Musculoskeletal: Negative.   Skin: Negative.   Neurological: Negative.   Endo/Heme/Allergies: Negative.   Psychiatric/Behavioral: Negative.     History reviewed. No pertinent family history.  Past Medical History:  Diagnosis Date  . Former smoker   . Medical history non-contributory     Past Surgical History:  Procedure Laterality Date  . TUBAL LIGATION      Social History:  reports that she quit smoking about 16 years ago. Her smoking use included cigarettes. She smoked 0.50  packs per day. She has never used smokeless tobacco. She reports previous alcohol use. She reports current drug use. Drug: Marijuana.  Allergies: No Known Allergies  No medications prior to admission.    Blood pressure 133/77, pulse 95, temperature 98.9 Hoffman (37.2 C), temperature source Oral, resp. rate 16, height 5\' 2"  (1.575 m), weight 96.1 kg, SpO2 98 %. Physical Exam: Physical Exam Constitutional:      General: She is not in acute distress.    Appearance: She is well-developed and normal weight. She is not toxic-appearing or diaphoretic.  HENT:     Head: Normocephalic and atraumatic.  Eyes:     Extraocular Movements: Extraocular movements intact.     Pupils: Pupils are equal, round, and reactive to light.  Neck:     Thyroid: No thyromegaly.     Trachea: No tracheal deviation.  Cardiovascular:     Rate and Rhythm: Normal rate and regular rhythm.     Pulses:          Dorsalis pedis pulses are 2+ on the right side and 2+ on the left side.     Heart sounds: Normal heart sounds. No murmur. No friction rub. No gallop.   Pulmonary:     Effort: Pulmonary effort is normal. No respiratory distress.     Breath sounds: Normal breath sounds.  Chest:     Chest wall: No mass, deformity or tenderness.  Abdominal:     General: Bowel sounds are normal. There is no abdominal bruit.     Palpations: Abdomen is soft.     Tenderness: There is no abdominal tenderness. There is no guarding or rebound.  Comments: Non-tender, mild distention, Negative Murphy's  Musculoskeletal:        General: Normal range of motion.     Cervical back: Normal range of motion and neck supple.     Right lower leg: No tenderness. No edema.     Left lower leg: No tenderness. No edema.  Lymphadenopathy:     Cervical: No cervical adenopathy.  Skin:    General: Skin is warm and dry.     Capillary Refill: Capillary refill takes less than 2 seconds.     Findings: No rash.  Neurological:     General: No focal  deficit present.     Mental Status: She is alert and oriented to person, place, and time.  Psychiatric:        Mood and Affect: Mood normal. Mood is not anxious.        Behavior: Behavior normal. Behavior is not agitated.     Results for orders placed or performed during the hospital encounter of 11/28/19 (from the past 48 hour(s))  Basic metabolic panel     Status: Abnormal   Collection Time: 11/28/19  1:09 PM  Result Value Ref Range   Sodium 133 (L) 135 - 145 mmol/L   Potassium 3.1 (L) 3.5 - 5.1 mmol/L   Chloride 94 (L) 98 - 111 mmol/L   CO2 24 22 - 32 mmol/L   Glucose, Bld 129 (H) 70 - 99 mg/dL    Comment: Glucose reference range applies only to samples taken after fasting for at least 8 hours.   BUN 6 6 - 20 mg/dL   Creatinine, Ser 0.77 0.44 - 1.00 mg/dL   Calcium 8.4 (L) 8.9 - 10.3 mg/dL   GFR calc non Af Amer >60 >60 mL/min   GFR calc Af Amer >60 >60 mL/min   Anion gap 15 5 - 15    Comment: Performed at [facility] [address], [place], [place] [address]  CBC     Status: Abnormal   Collection Time: 11/28/19  1:09 PM  Result Value Ref Range   WBC 10.7 (H) 4.0 - 10.5 K/uL   RBC 3.62 (L) 3.87 - 5.11 MIL/uL   Hemoglobin 8.6 (L) 12.0 - 15.0 g/dL   HCT 28.4 (L) 36.0 - 46.0 %   MCV 78.5 (L) 80.0 - 100.0 fL   MCH 23.8 (L) 26.0 - 34.0 pg   MCHC 30.3 30.0 - 36.0 g/dL   RDW 15.2 11.5 - 15.5 %   Platelets 467 (H) 150 - 400 K/uL   nRBC 0.0 0.0 - 0.2 %    Comment: Performed at [facility] [address], [place], [place] [address]  Troponin I (High Sensitivity)     Status: None   Collection Time: 11/28/19  1:09 PM  Result Value Ref Range   Troponin I (High Sensitivity) 13 <18 ng/L    Comment: (NOTE) Elevated high sensitivity troponin I (hsTnI) values and significant  changes across serial measurements may suggest ACS but many other  chronic and acute conditions are known to elevate hsTnI results.  Refer to the "Links" section for chest pain algorithms and  additional  guidance. Performed at [facility], [place] [address], [place], [place] [address]   I-Stat beta hCG blood, ED     Status: None   Collection Time: 11/28/19  1:18 PM  Result Value Ref Range   I-stat hCG, quantitative <5.0 <5 mIU/mL   Comment 3  Comment:   GEST. AGE      CONC.  (mIU/mL)   <=1 WEEK        5 - 50     2 WEEKS       50 - 500     3 WEEKS       100 - 10,000     4 WEEKS     1,000 - 30,000        FEMALE AND NON-PREGNANT FEMALE:     LESS THAN 5 mIU/mL   Lipase, blood     Status: None   Collection Time: 11/29/19  7:32 AM  Result Value Ref Range   Lipase 17 11 - 51 U/L    Comment: Performed at [facility], [place] [address] [place], [place] [address]  Hepatic function panel     Status: Abnormal   Collection Time: 11/29/19  7:32 AM  Result Value Ref Range   Total Protein 7.6 6.5 - 8.1 g/dL   Albumin 2.6 (L) 3.5 - 5.0 g/dL   AST 59 (H) 15 - 41 U/L   ALT 24 0 - 44 U/L   Alkaline Phosphatase 175 (H) 38 - 126 U/L   Total Bilirubin 1.2 0.3 - 1.2 mg/dL   Bilirubin, Direct 0.2 0.0 - 0.2 mg/dL   Indirect Bilirubin 1.0 (H) 0.3 - 0.9 mg/dL    Comment: Performed at [facility] [address] [place], [place] [address]  Troponin I (High Sensitivity)     Status: Abnormal   Collection Time: 11/29/19  7:57 AM  Result Value Ref Range   Troponin I (High Sensitivity) 25 (H) <18 ng/L    Comment: (NOTE) Elevated high sensitivity troponin I (hsTnI) values and significant  changes across serial measurements may suggest ACS but many other  chronic and acute conditions are known to elevate hsTnI results.  Refer to the "Links" section for chest pain algorithms and additional  guidance. Performed at [facility], [place] [address], [place], [place] [address]   Respiratory Panel by RT PCR (Flu A&B, Covid) - Nasopharyngeal Swab     Status: None   Collection Time: 11/29/19 10:28 AM   Specimen: Nasopharyngeal Swab  Result Value Ref Range   SARS  Coronavirus 2 by RT PCR NEGATIVE NEGATIVE    Comment: (NOTE) SARS-CoV-2 target nucleic acids are NOT DETECTED. The SARS-CoV-2 RNA is generally detectable in upper respiratoy specimens during the acute phase of infection. The lowest concentration of SARS-CoV-2 viral copies this assay can detect is 131 copies/mL. A negative result does not preclude SARS-Cov-2 infection and should not be used as the sole basis for treatment or other patient management decisions. A negative result may occur with  improper specimen collection/handling, submission of specimen other than nasopharyngeal swab, presence of viral mutation(s) within the areas targeted by this assay, and inadequate number of viral copies (<131 copies/mL). A negative result must be combined with clinical observations, patient history, and epidemiological information. The expected result is Negative. Fact Sheet for Patients:  PinkCheek.be Fact Sheet for Healthcare Providers:  GravelBags.it This test is not yet ap proved or cleared by the [place] FDA and  has been authorized for detection and/or diagnosis of SARS-CoV-2 by FDA under an Emergency Use Authorization (EUA). This EUA will remain  in effect (meaning this test can be used) for the duration of the COVID-19 declaration under Section 564(b)(1) of the Act, 21 U.S.C. section 360bbb-3(b)(1), unless the authorization is terminated or revoked sooner.  Influenza A by PCR NEGATIVE NEGATIVE   Influenza B by PCR NEGATIVE NEGATIVE    Comment: (NOTE) The Xpert Xpress SARS-CoV-2/FLU/RSV assay is intended as an aid in  the diagnosis of influenza from Nasopharyngeal swab specimens and  should not be used as a sole basis for treatment. Nasal washings and  aspirates are unacceptable for Xpert Xpress SARS-CoV-2/FLU/RSV  testing. Fact Sheet for Patients: PinkCheek.be Fact Sheet for Healthcare  Providers: GravelBags.it This test is not yet approved or cleared by the [place] FDA and  has been authorized for detection and/or diagnosis of SARS-CoV-2 by  FDA under an Emergency Use Authorization (EUA). This EUA will remain  in effect (meaning this test can be used) for the duration of the  Covid-19 declaration under Section 564(b)(1) of the Act, 21  U.S.C. section 360bbb-3(b)(1), unless the authorization is  terminated or revoked. Performed at [facility], [place] [address] [place], [place] [address]   CBC with Differential/Platelet     Status: Abnormal   Collection Time: 11/29/19  2:00 PM  Result Value Ref Range   WBC 11.0 (H) 4.0 - 10.5 K/uL   RBC 3.48 (L) 3.87 - 5.11 MIL/uL   Hemoglobin 8.4 (L) 12.0 - 15.0 g/dL   HCT 28.1 (L) 36.0 - 46.0 %   MCV 80.7 80.0 - 100.0 fL   MCH 24.1 (L) 26.0 - 34.0 pg   MCHC 29.9 (L) 30.0 - 36.0 g/dL   RDW 15.7 (H) 11.5 - 15.5 %   Platelets 464 (H) 150 - 400 K/uL   nRBC 0.0 0.0 - 0.2 %   Neutrophils Relative % 68 %   Neutro Abs 7.5 1.7 - 7.7 K/uL   Lymphocytes Relative 22 %   Lymphs Abs 2.5 0.7 - 4.0 K/uL   Monocytes Relative 8 %   Monocytes Absolute 0.9 0.1 - 1.0 K/uL   Eosinophils Relative 1 %   Eosinophils Absolute 0.1 0.0 - 0.5 K/uL   Basophils Relative 0 %   Basophils Absolute 0.0 0.0 - 0.1 K/uL   Immature Granulocytes 1 %   Abs Immature Granulocytes 0.05 0.00 - 0.07 K/uL    Comment: Performed at [facility], [address], [place], [place] [address]  Comprehensive metabolic panel     Status: Abnormal   Collection Time: 11/29/19  2:00 PM  Result Value Ref Range   Sodium 133 (L) 135 - 145 mmol/L   Potassium 3.8 3.5 - 5.1 mmol/L   Chloride 97 (L) 98 - 111 mmol/L   CO2 21 (L) 22 - 32 mmol/L   Glucose, Bld 100 (H) 70 - 99 mg/dL    Comment: Glucose reference range applies only to samples taken after fasting for at least 8 hours.   BUN 7 6 - 20 mg/dL   Creatinine, Ser 0.61 0.44 - 1.00  mg/dL   Calcium 8.1 (L) 8.9 - 10.3 mg/dL   Total Protein 7.2 6.5 - 8.1 g/dL   Albumin 2.5 (L) 3.5 - 5.0 g/dL   AST 56 (H) 15 - 41 U/L   ALT 21 0 - 44 U/L   Alkaline Phosphatase 176 (H) 38 - 126 U/L   Total Bilirubin 1.2 0.3 - 1.2 mg/dL   GFR calc non Af Amer >60 >60 mL/min   GFR calc Af Amer >60 >60 mL/min   Anion gap 15 5 - 15    Comment: Performed at [facility] [address], [place], [place] [address]   DG Chest 2  View  Result Date: 11/28/2019 CLINICAL DATA:  59 year old female with history of chest and abdominal pain for the past 2 weeks. EXAM: CHEST - 2 VIEW COMPARISON:  No priors. FINDINGS: Lung volumes are normal. No consolidative airspace disease. No pleural effusions. No pneumothorax. No pulmonary nodule or mass noted. Pulmonary vasculature and the cardiomediastinal silhouette are within normal limits. IMPRESSION: No radiographic evidence of acute cardiopulmonary disease. Electronically Signed   By: Vinnie Langton M.D.   On: 11/28/2019 13:46   CT Angio Chest PE W/Cm &/Or Wo Cm  Result Date: 11/29/2019 CLINICAL DATA:  RIGHT upper quadrant and RIGHT lower chest pain, shortness of breath, lower extremity edema, former smoker EXAM: CT ANGIOGRAPHY CHEST CT ABDOMEN AND PELVIS WITH CONTRAST TECHNIQUE: Multidetector CT imaging of the chest was performed using the standard protocol during bolus administration of intravenous contrast. Multiplanar CT image reconstructions and MIPs were obtained to evaluate the vascular anatomy. Multidetector CT imaging of the abdomen and pelvis was performed using the standard protocol during bolus administration of intravenous contrast. CONTRAST:  166mL OMNIPAQUE IOHEXOL 300 MG/ML SOLN IV. No oral contrast. COMPARISON:  CT abdomen and pelvis 11/22/2006 FINDINGS: CTA CHEST FINDINGS Cardiovascular: Aorta normal caliber without aneurysm or dissection. Pulmonary arteries well opacified. Filling defects are seen within the pulmonary arteries bilaterally  RIGHT greater than LEFT consistent with pulmonary emboli. These are greatest in the RIGHT lower lobe but affect all remaining lobes as well. RV/LV ratio = 1.0, elevated, consistent with RIGHT heart strain. Mediastinum/Nodes: Base of cervical region normal appearance. Normal sized axillary nodes bilaterally. No thoracic adenopathy. Esophagus unremarkable. Lungs/Pleura: Linear subsegmental atelectasis LEFT lower lobe. Lungs otherwise clear. No pulmonary infiltrate, pleural effusion, or pneumothorax. Musculoskeletal: No acute osseous findings. Review of the MIP images confirms the above findings. CT ABDOMEN and PELVIS FINDINGS Hepatobiliary: Multiple poorly defined mass lesions are seen throughout the liver consistent with extensive hepatic metastatic disease. Largest of these measure 6.6 x 4.2 cm lateral segment LEFT lobe image 43, 5.7 x 5.7 cm posterior RIGHT lobe image 34, anterior RIGHT lobe 7.2 x 5.0 cm image 33, and 7.6 x 7.1 cm anterior RIGHT lobe image 22. Gallbladder unremarkable. Pancreas: Normal appearance Spleen: Normal appearance Adrenals/Urinary Tract: Adrenal glands normal appearance. BILATERAL renal cysts. Kidneys, ureters, and bladder otherwise normal appearance. Stomach/Bowel: Normal appendix. Large mass identified at cecum extending into ileocecal valve region and terminal ileum measuring 5.6 x 4.0 x 4.5 cm consistent with colonic neoplasm. Thickening of terminal ileum without obstruction. Stomach decompressed. Remaining large and small bowel loops unremarkable. Vascular/Lymphatic: Minimal atherosclerotic calcification aorta. Aorta normal caliber. Few pelvic phleboliths. Mesenteric mass with minimal calcification 4.1 x 2.8 cm image 54 likely nodal metastasis. Additional enlarged pathologic node mesentery medial to cecal tip 1.7 x 1.5 cm. Normal sized inguinal lymph nodes. Reproductive: Uterus and adnexa unremarkable Other: Tiny umbilical hernia containing fat. No free air or free fluid.  Musculoskeletal: Bones demineralized. Lipoma involving the distal RIGHT iliopsoas muscle/tendon, 3.6 x 2.5 cm x 8.9 cm length. Review of the MIP images confirms the above findings. IMPRESSION: Multiple BILATERAL pulmonary emboli RIGHT greater than LEFT. Positive for acute PE with CT evidence of right heart strain (RV/LV Ratio = 1.0) consistent with at least submassive (intermediate risk) PE; the presence of right heart strain has been associated with an increased risk of morbidity and mortality. Large cecal mass extending into ileocecal valve region and terminal ileum measuring 5.6 x 4.0 x 4.5 cm consistent with colonic neoplasm. Multiple hepatic metastases. Mesenteric nodal metastases. Aortic  Atherosclerosis (ICD10-I70.0). Critical Value/emergent results were called by telephone at the time of interpretation on 11/29/2019 at 12:24 pm to provider DR. Abdou Stocks REES , who verbally acknowledged these results. Electronically Signed   By: Lavonia Dana M.D.   On: 11/29/2019 12:27   CT Abdomen Pelvis W Contrast  Result Date: 11/29/2019 CLINICAL DATA:  RIGHT upper quadrant and RIGHT lower chest pain, shortness of breath, lower extremity edema, former smoker EXAM: CT ANGIOGRAPHY CHEST CT ABDOMEN AND PELVIS WITH CONTRAST TECHNIQUE: Multidetector CT imaging of the chest was performed using the standard protocol during bolus administration of intravenous contrast. Multiplanar CT image reconstructions and MIPs were obtained to evaluate the vascular anatomy. Multidetector CT imaging of the abdomen and pelvis was performed using the standard protocol during bolus administration of intravenous contrast. CONTRAST:  147mL OMNIPAQUE IOHEXOL 300 MG/ML SOLN IV. No oral contrast. COMPARISON:  CT abdomen and pelvis 11/22/2006 FINDINGS: CTA CHEST FINDINGS Cardiovascular: Aorta normal caliber without aneurysm or dissection. Pulmonary arteries well opacified. Filling defects are seen within the pulmonary arteries bilaterally RIGHT greater  than LEFT consistent with pulmonary emboli. These are greatest in the RIGHT lower lobe but affect all remaining lobes as well. RV/LV ratio = 1.0, elevated, consistent with RIGHT heart strain. Mediastinum/Nodes: Base of cervical region normal appearance. Normal sized axillary nodes bilaterally. No thoracic adenopathy. Esophagus unremarkable. Lungs/Pleura: Linear subsegmental atelectasis LEFT lower lobe. Lungs otherwise clear. No pulmonary infiltrate, pleural effusion, or pneumothorax. Musculoskeletal: No acute osseous findings. Review of the MIP images confirms the above findings. CT ABDOMEN and PELVIS FINDINGS Hepatobiliary: Multiple poorly defined mass lesions are seen throughout the liver consistent with extensive hepatic metastatic disease. Largest of these measure 6.6 x 4.2 cm lateral segment LEFT lobe image 43, 5.7 x 5.7 cm posterior RIGHT lobe image 34, anterior RIGHT lobe 7.2 x 5.0 cm image 33, and 7.6 x 7.1 cm anterior RIGHT lobe image 22. Gallbladder unremarkable. Pancreas: Normal appearance Spleen: Normal appearance Adrenals/Urinary Tract: Adrenal glands normal appearance. BILATERAL renal cysts. Kidneys, ureters, and bladder otherwise normal appearance. Stomach/Bowel: Normal appendix. Large mass identified at cecum extending into ileocecal valve region and terminal ileum measuring 5.6 x 4.0 x 4.5 cm consistent with colonic neoplasm. Thickening of terminal ileum without obstruction. Stomach decompressed. Remaining large and small bowel loops unremarkable. Vascular/Lymphatic: Minimal atherosclerotic calcification aorta. Aorta normal caliber. Few pelvic phleboliths. Mesenteric mass with minimal calcification 4.1 x 2.8 cm image 54 likely nodal metastasis. Additional enlarged pathologic node mesentery medial to cecal tip 1.7 x 1.5 cm. Normal sized inguinal lymph nodes. Reproductive: Uterus and adnexa unremarkable Other: Tiny umbilical hernia containing fat. No free air or free fluid. Musculoskeletal: Bones  demineralized. Lipoma involving the distal RIGHT iliopsoas muscle/tendon, 3.6 x 2.5 cm x 8.9 cm length. Review of the MIP images confirms the above findings. IMPRESSION: Multiple BILATERAL pulmonary emboli RIGHT greater than LEFT. Positive for acute PE with CT evidence of right heart strain (RV/LV Ratio = 1.0) consistent with at least submassive (intermediate risk) PE; the presence of right heart strain has been associated with an increased risk of morbidity and mortality. Large cecal mass extending into ileocecal valve region and terminal ileum measuring 5.6 x 4.0 x 4.5 cm consistent with colonic neoplasm. Multiple hepatic metastases. Mesenteric nodal metastases. Aortic Atherosclerosis (ICD10-I70.0). Critical Value/emergent results were called by telephone at the time of interpretation on 11/29/2019 at 12:24 pm to provider DR. Jasmine Maceachern REES , who verbally acknowledged these results. Electronically Signed   By: Lavonia Dana M.D.   On:  11/29/2019 12:27   VAS [place] LOWER EXTREMITY VENOUS (DVT)  Result Date: 11/29/2019  Lower Venous DVTStudy Indications: Pulmonary embolism.  Comparison Study: no prior Performing Technologist: Abram Sander RVS  Examination Guidelines: A complete evaluation includes B-mode imaging, spectral Doppler, color Doppler, and power Doppler as needed of all accessible portions of each vessel. Bilateral testing is considered an integral part of a complete examination. Limited examinations for reoccurring indications may be performed as noted. The reflux portion of the exam is performed with the patient in reverse Trendelenburg.  +---------+---------------+---------+-----------+----------+--------------+ RIGHT    CompressibilityPhasicitySpontaneityPropertiesThrombus Aging +---------+---------------+---------+-----------+----------+--------------+ CFV      Full           Yes      Yes                                  +---------+---------------+---------+-----------+----------+--------------+ SFJ      Full                                                        +---------+---------------+---------+-----------+----------+--------------+ FV Prox  Full                                                        +---------+---------------+---------+-----------+----------+--------------+ FV Mid   Full                                                        +---------+---------------+---------+-----------+----------+--------------+ FV DistalFull                                                        +---------+---------------+---------+-----------+----------+--------------+ PFV      Full                                                        +---------+---------------+---------+-----------+----------+--------------+ POP      Full           Yes      Yes                                 +---------+---------------+---------+-----------+----------+--------------+ PTV      Full                                                        +---------+---------------+---------+-----------+----------+--------------+ PERO     Full                                                        +---------+---------------+---------+-----------+----------+--------------+   +---------+---------------+---------+-----------+----------+--------------+  LEFT     CompressibilityPhasicitySpontaneityPropertiesThrombus Aging +---------+---------------+---------+-----------+----------+--------------+ CFV      Full           Yes      Yes                                 +---------+---------------+---------+-----------+----------+--------------+ SFJ      Full                                                        +---------+---------------+---------+-----------+----------+--------------+ FV Prox  Full                                                         +---------+---------------+---------+-----------+----------+--------------+ FV Mid   Full                                                        +---------+---------------+---------+-----------+----------+--------------+ FV DistalFull                                                        +---------+---------------+---------+-----------+----------+--------------+ PFV      Full                                                        +---------+---------------+---------+-----------+----------+--------------+ POP      Full           Yes      Yes                                 +---------+---------------+---------+-----------+----------+--------------+ PTV      Full                                                        +---------+---------------+---------+-----------+----------+--------------+ PERO                                                  Not visualized +---------+---------------+---------+-----------+----------+--------------+ Gastroc  None                                         Acute          +---------+---------------+---------+-----------+----------+--------------+  Summary: RIGHT: - There is no evidence of deep vein thrombosis in the lower extremity.  - No cystic structure found in the popliteal fossa.  LEFT: - Findings consistent with acute deep vein thrombosis involving the left gastrocnemius veins. - No cystic structure found in the popliteal fossa.  *See table(s) above for measurements and observations.    Preliminary    US Abdomen Limited RUQ  Result Date: 11/29/2019 CLINICAL DATA:  Right upper quadrant pain 3 months. EXAM: ULTRASOUND ABDOMEN LIMITED RIGHT UPPER QUADRANT COMPARISON:  05/08/2007 FINDINGS: Gallbladder: Significant cholelithiasis with largest stone measuring 1.1 cm. Gallbladder wall thickening measuring 5.5 mm. Negative sonographic Murphy sign. No adjacent free fluid. Common bile duct: Diameter: 5.7 mm. Liver: Heterogeneous  parenchymal echogenicity with numerous liver masses many of which have a more hyperechoic periphery with more hypoechoic appearance centrally suggesting a somewhat target appearance. There is a large irregular shape cystic lesion with echogenic periphery over the right lobe measuring approximately 7.3 cm. Portal vein is patent on color Doppler imaging with normal direction of blood flow towards the liver. Other: None. IMPRESSION: 1. Moderate to severe cholelithiasis with mild gallbladder wall thickening of 5.5 mm. Recommend clinical correlation as findings could be seen with acute cholecystitis. 2. Numerous liver masses with the largest over the right lobe measuring 7.3 cm containing central cystic/necrotic area. Findings are highly suspicious for metastatic disease. Recommend clinical correlation as further evaluation with chest/abdominal/pelvic CT may be helpful to search for primary malignancy. Electronically Signed   By: Marin Olp M.D.   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 While the patient has cholelithiasis and may have a history of biliary colic, I do not think she has cholecystitis. I believe the significant mass lesions of the liver are causing her RUQ pain and her distended gallbladder is a result of underlying liver dysfunction and edema. I do not recommend any further workup for cholecystitis. I recommend GI consult for colonoscopy and biopsy of cecal mass. No acute surgical needs at this time. General surgery will follow.   Jill Alexanders, [facility] 11/29/2019, 4:19 PM Pager: [phone] Consults: [phone]

## 2019-11-29 NOTE — Progress Notes (Signed)
ANTICOAGULATION CONSULT NOTE - Initial Consult  Pharmacy Consult for heparin Indication: pulmonary embolus  No Known Allergies  Patient Measurements: Height: 5\' 2"  (157.5 cm) Weight: 95.3 kg (210 lb) IBW/kg (Calculated) : 50.1 Heparin Dosing Weight: 72.4kg  Vital Signs: Temp: 98.3 F (36.8 C) (04/29 0720) BP: 112/78 (04/29 1100) Pulse Rate: 87 (04/29 1100)  Labs: Recent Labs    11/28/19 1309 11/29/19 0757  HGB 8.6*  --   HCT 28.4*  --   PLT 467*  --   CREATININE 0.77  --   TROPONINIHS 13 25*    Estimated Creatinine Clearance: 83.5 mL/min (by C-G formula based on SCr of 0.77 mg/dL).   Medical History: History reviewed. No pertinent past medical history.  Medications:  Infusions:  . heparin      Assessment: 38 yof presented to the ED with CP and abdmominal pain. Found to have multiple bilateral PE's with right heart strain. To start IV heparin. Baseline Hgb is low at 8.6 and platelets are elevated. She is not on anticoagulation PTA.   Goal of Therapy:  Heparin level 0.3-0.7 units/ml Monitor platelets by anticoagulation protocol: Yes   Plan:  Heparin bolus 4000 units IV x 1 Heparin gtt 1200 units/hr Check a 6 hr heparin level Daily heparin level and CBC  Anita Hoffman, Rande Lawman 11/29/2019,12:42 PM

## 2019-11-29 NOTE — ED Provider Notes (Signed)
[facility] EMERGENCY DEPARTMENT Provider Note   CSN: [id number] Arrival date & time: 11/28/19  1227     History Chief Complaint  Patient presents with  . Chest Pain    ELITHA FAUTEUX is a 59 y.o. female.  The history is provided by the patient and medical records. No language interpreter was used.  Chest Pain  ANNELISE BLIZARD is a 59 y.o. female who presents to the Emergency Department complaining of abdominal pain. She presents the emergency department complaining of acute on chronic right upper quadrant abdominal pain. She complains of a lifelong history of intermittent abdominal pain. Over the last three weeks her pain is significantly worsened and she has severe right upper quadrant abdominal pain described as a cramping sensation. Symptoms are worse when she has greasy foods or meals. She has occasional nausea and vomiting. No reports of fevers. She does have occasional shortness of breath with increased fatigue and dyspnea on exertion. Over the last few days she has been unable to eat secondary to severe abdominal pain. No black or bloody stools.  No hematemesis.  She did have some cramping in her legs a few weeks ago, now resolved. She has no medical problems and takes no medications. She is a former smoker. She denies any alcohol or drug use.    History reviewed. No pertinent past medical history.  Patient Active Problem List   Diagnosis Date Noted  . ANXIETY, SITUATIONAL 10/13/2009  . DRY MOUTH 10/03/2009  . PALPITATIONS, RECURRENT 10/03/2009  . TOBACCO USE, QUIT 09/29/2006    History reviewed. No pertinent surgical history.   OB History   No obstetric history on file.     No family history on file.  Social History   Tobacco Use  . Smoking status: Not on file  Substance Use Topics  . Alcohol use: Not on file  . Drug use: Not on file    Home Medications Prior to Admission medications   Not on File    Allergies    Patient has no  known allergies.  Review of Systems   Review of Systems  Cardiovascular: Positive for chest pain.  All other systems reviewed and are negative.   Physical Exam Updated Vital Signs BP (!) 130/96 (BP Location: Right Arm)   Pulse (!) 103   Temp 98.3 F (36.8 C)   Resp (!) 21   Ht 5\' 2"  (1.575 m)   Wt 95.3 kg   SpO2 100%   BMI 38.41 kg/m   Physical Exam Vitals and nursing note reviewed.  Constitutional:      Appearance: She is well-developed.  HENT:     Head: Normocephalic and atraumatic.  Cardiovascular:     Rate and Rhythm: Regular rhythm. Tachycardia present.     Heart sounds: No murmur.  Pulmonary:     Effort: Pulmonary effort is normal. No respiratory distress.     Breath sounds: Normal breath sounds.  Abdominal:     Palpations: Abdomen is soft.     Tenderness: There is no guarding or rebound.     Comments: Moderate epigastric and RUQ tenderness.  RUQ fullness.    Musculoskeletal:        General: No tenderness.  Skin:    General: Skin is warm and dry.  Neurological:     Mental Status: She is alert and oriented to person, place, and time.  Psychiatric:        Behavior: Behavior normal.     ED Results /  Procedures / Treatments   Labs (all labs ordered are listed, but only abnormal results are displayed) Labs Reviewed  BASIC METABOLIC PANEL - Abnormal; Notable for the following components:      Result Value   Sodium 133 (*)    Potassium 3.1 (*)    Chloride 94 (*)    Glucose, Bld 129 (*)    Calcium 8.4 (*)    All other components within normal limits  CBC - Abnormal; Notable for the following components:   WBC 10.7 (*)    RBC 3.62 (*)    Hemoglobin 8.6 (*)    HCT 28.4 (*)    MCV 78.5 (*)    MCH 23.8 (*)    Platelets 467 (*)    All other components within normal limits  LIPASE, BLOOD  HEPATIC FUNCTION PANEL  I-STAT BETA HCG BLOOD, ED ([facility], [facility], [facility] ONLY)  TROPONIN I (HIGH SENSITIVITY)  TROPONIN I (HIGH SENSITIVITY)    EKG EKG  Interpretation  Date/Time:  Wednesday November 28 2019 13:05:09 EDT Ventricular Rate:  103 PR Interval:  152 QRS Duration: 62 QT Interval:  334 QTC Calculation: 437 R Axis:   59 Text Interpretation: Sinus tachycardia Nonspecific T wave abnormality Abnormal ECG Confirmed by Quintella Reichert [phone]) on 11/29/2019 7:24:34 AM   Radiology DG Chest 2 View  Result Date: 11/28/2019 CLINICAL DATA:  59 year old female with history of chest and abdominal pain for the past 2 weeks. EXAM: CHEST - 2 VIEW COMPARISON:  No priors. FINDINGS: Lung volumes are normal. No consolidative airspace disease. No pleural effusions. No pneumothorax. No pulmonary nodule or mass noted. Pulmonary vasculature and the cardiomediastinal silhouette are within normal limits. IMPRESSION: No radiographic evidence of acute cardiopulmonary disease. Electronically Signed   By: Vinnie Langton M.D.   On: 11/28/2019 13:46    Procedures Procedures (including critical care time) CRITICAL CARE Performed by: Quintella Reichert   Total critical care time: 35 minutes  Critical care time was exclusive of separately billable procedures and treating other patients.  Critical care was necessary to treat or prevent imminent or life-threatening deterioration.  Critical care was time spent personally by me on the following activities: development of treatment plan with patient and/or surrogate as well as nursing, discussions with consultants, evaluation of patient's response to treatment, examination of patient, obtaining history from patient or surrogate, ordering and performing treatments and interventions, ordering and review of laboratory studies, ordering and review of radiographic studies, pulse oximetry and re-evaluation of patient's condition.  Medications Ordered in ED Medications  sodium chloride 0.9 % bolus 1,000 mL (has no administration in time range)  morphine 4 MG/ML injection 4 mg (has no administration in time range)   ondansetron (ZOFRAN) injection 4 mg (has no administration in time range)    ED Course  I have reviewed the triage vital signs and the nursing notes.  Pertinent labs & imaging results that were available during my care of the patient were reviewed by me and considered in my medical decision making (see chart for details).    MDM Rules/Calculators/A&P                      Patient here for evaluation of progressive right upper quadrant abdominal pain as well as shortness of breath. She is non-toxic appearing on evaluation. She is tachycardic. Examination with right upper quadrant abdominal fullness. Imaging demonstrates acute bilateral pulmonary embolism's with evidence of right heart strain. Imaging is also concerning for metastatic cancer, possible colon  origin. Discussed with patient findings of studies and recommendation for admission and she is in agreement with treatment plan. Hospitalist consulted for admission. General surgery consulted per hospitalist requests regarding colon mass and right upper quadrant pain. Final Clinical Impression(s) / ED Diagnoses Final diagnoses:  RUQ abdominal pain    Rx / DC Orders ED Discharge Orders    None       Quintella Reichert, MD 11/29/19 1558

## 2019-11-29 NOTE — Progress Notes (Signed)
  Echocardiogram 2D Echocardiogram has been performed.  Jennette Dubin 11/29/2019, 3:36 PM

## 2019-11-29 NOTE — ED Notes (Signed)
Patient in triage

## 2019-11-30 DIAGNOSIS — R1011 Right upper quadrant pain: Secondary | ICD-10-CM

## 2019-11-30 DIAGNOSIS — D490 Neoplasm of unspecified behavior of digestive system: Secondary | ICD-10-CM

## 2019-11-30 DIAGNOSIS — C787 Secondary malignant neoplasm of liver and intrahepatic bile duct: Secondary | ICD-10-CM

## 2019-11-30 LAB — URINALYSIS, ROUTINE W REFLEX MICROSCOPIC
Bilirubin Urine: NEGATIVE
Glucose, UA: NEGATIVE mg/dL
Hgb urine dipstick: NEGATIVE
Ketones, ur: 20 mg/dL — AB
Leukocytes,Ua: NEGATIVE
Nitrite: NEGATIVE
Protein, ur: 30 mg/dL — AB
Specific Gravity, Urine: >1.046 — ABNORMAL HIGH (ref 1.005–1.030)
pH: 5 (ref 5.0–8.0)

## 2019-11-30 LAB — HEPARIN LEVEL (UNFRACTIONATED)
Heparin Unfractionated: 0.24 IU/mL — ABNORMAL LOW (ref 0.30–0.70)
Heparin Unfractionated: 0.32 IU/mL (ref 0.30–0.70)
Heparin Unfractionated: 0.38 IU/mL (ref 0.30–0.70)

## 2019-11-30 LAB — CD4/CD8 (T-HELPER/T-SUPPRESSOR CELL)
CD4 absolute: 563 /uL (ref 400–1790)
CD4%: 34 % (ref 33–65)
CD8 T Cell Abs: 894 /uL (ref 190–1000)
CD8tox: 54 % — ABNORMAL HIGH (ref 12–40)
Ratio: 0.63 — ABNORMAL LOW (ref 1.0–3.0)
Total lymphocyte count: 1672 /uL (ref 1000–4000)

## 2019-11-30 LAB — FERRITIN: Ferritin: 33 ng/mL (ref 11–307)

## 2019-11-30 LAB — COMPREHENSIVE METABOLIC PANEL
ALT: 19 U/L (ref 0–44)
AST: 53 U/L — ABNORMAL HIGH (ref 15–41)
Albumin: 2.1 g/dL — ABNORMAL LOW (ref 3.5–5.0)
Alkaline Phosphatase: 162 U/L — ABNORMAL HIGH (ref 38–126)
Anion gap: 9 (ref 5–15)
BUN: 6 mg/dL (ref 6–20)
CO2: 24 mmol/L (ref 22–32)
Calcium: 7.6 mg/dL — ABNORMAL LOW (ref 8.9–10.3)
Chloride: 100 mmol/L (ref 98–111)
Creatinine, Ser: 0.61 mg/dL (ref 0.44–1.00)
GFR calc Af Amer: >60 mL/min (ref >60)
GFR calc non Af Amer: >60 mL/min (ref >60)
Glucose, Bld: 104 mg/dL — ABNORMAL HIGH (ref 70–99)
Potassium: 3.4 mmol/L — ABNORMAL LOW (ref 3.5–5.1)
Sodium: 133 mmol/L — ABNORMAL LOW (ref 135–145)
Total Bilirubin: 0.9 mg/dL (ref 0.3–1.2)
Total Protein: 6.3 g/dL — ABNORMAL LOW (ref 6.5–8.1)

## 2019-11-30 LAB — VITAMIN B12: Vitamin B-12: 1351 pg/mL — ABNORMAL HIGH (ref 180–914)

## 2019-11-30 LAB — CBC
HCT: 23.8 % — ABNORMAL LOW (ref 36.0–46.0)
Hemoglobin: 7.4 g/dL — ABNORMAL LOW (ref 12.0–15.0)
MCH: 24.2 pg — ABNORMAL LOW (ref 26.0–34.0)
MCHC: 31.1 g/dL (ref 30.0–36.0)
MCV: 77.8 fL — ABNORMAL LOW (ref 80.0–100.0)
Platelets: 441 10*3/uL — ABNORMAL HIGH (ref 150–400)
RBC: 3.06 MIL/uL — ABNORMAL LOW (ref 3.87–5.11)
RDW: 15.4 % (ref 11.5–15.5)
WBC: 9.6 10*3/uL (ref 4.0–10.5)
nRBC: 0 % (ref 0.0–0.2)

## 2019-11-30 LAB — IRON AND TIBC
Iron: 19 ug/dL — ABNORMAL LOW (ref 28–170)
Saturation Ratios: 8 % — ABNORMAL LOW (ref 10.4–31.8)
TIBC: 232 ug/dL — ABNORMAL LOW (ref 250–450)
UIBC: 213 ug/dL

## 2019-11-30 LAB — TSH: TSH: 1.261 u[IU]/mL (ref 0.350–4.500)

## 2019-11-30 LAB — HIV-1/2 AB - DIFFERENTIATION
HIV 1 Ab: POSITIVE — AB
HIV 2 Ab: NEGATIVE

## 2019-11-30 MED ORDER — POTASSIUM CHLORIDE 10 MEQ/100ML IV SOLN
10.0000 meq | INTRAVENOUS | Status: AC
  Administered 2019-11-30 (×4): 10 meq via INTRAVENOUS
  Filled 2019-11-30 (×4): qty 100

## 2019-11-30 MED ORDER — IPRATROPIUM-ALBUTEROL 0.5-2.5 (3) MG/3ML IN SOLN: 3.0000 mL | RESPIRATORY_TRACT | Status: DC | PRN

## 2019-11-30 MED ORDER — POLYETHYLENE GLYCOL 3350 17 G PO PACK: 17.0000 g | PACK | Freq: Every day | ORAL | Status: DC | PRN

## 2019-11-30 MED ORDER — PEG-KCL-NACL-NASULF-NA ASC-C 100 G PO SOLR
0.5000 | Freq: Once | ORAL | Status: AC
  Administered 2019-11-30: 100 g via ORAL
  Filled 2019-11-30: qty 1

## 2019-11-30 MED ORDER — SODIUM CHLORIDE 0.9 % IV SOLN
510.0000 mg | Freq: Once | INTRAVENOUS | Status: AC
  Administered 2019-11-30: 17:00:00 510 mg via INTRAVENOUS
  Filled 2019-11-30: qty 17

## 2019-11-30 MED ORDER — PEG-KCL-NACL-NASULF-NA ASC-C 100 G PO SOLR: 1.0000 | Freq: Once | ORAL | Status: DC

## 2019-11-30 MED ORDER — SENNOSIDES-DOCUSATE SODIUM 8.6-50 MG PO TABS: 2.0000 | ORAL_TABLET | Freq: Every evening | ORAL | Status: DC | PRN

## 2019-11-30 MED ORDER — METOCLOPRAMIDE HCL 5 MG/ML IJ SOLN
10.0000 mg | Freq: Once | INTRAMUSCULAR | Status: AC
  Administered 2019-12-01: 10 mg via INTRAVENOUS
  Filled 2019-11-30: qty 2

## 2019-11-30 MED ORDER — FERROUS SULFATE 325 (65 FE) MG PO TABS
325.0000 mg | ORAL_TABLET | Freq: Two times a day (BID) | ORAL | Status: DC
  Administered 2019-12-01 – 2019-12-03 (×4): 325 mg via ORAL
  Filled 2019-11-30 (×5): qty 1

## 2019-11-30 MED ORDER — PEG-KCL-NACL-NASULF-NA ASC-C 100 G PO SOLR
0.5000 | Freq: Once | ORAL | Status: AC
  Administered 2019-12-01: 100 g via ORAL
  Filled 2019-11-30: qty 1

## 2019-11-30 MED ORDER — BISACODYL 5 MG PO TBEC
20.0000 mg | DELAYED_RELEASE_TABLET | Freq: Once | ORAL | Status: AC
  Administered 2019-11-30: 13:00:00 20 mg via ORAL
  Filled 2019-11-30: qty 4

## 2019-11-30 MED ORDER — HEPARIN BOLUS VIA INFUSION
1000.0000 [IU] | Freq: Once | INTRAVENOUS | Status: AC
  Administered 2019-11-30: 1000 [IU] via INTRAVENOUS
  Filled 2019-11-30: qty 1000

## 2019-11-30 MED ORDER — METOCLOPRAMIDE HCL 5 MG/ML IJ SOLN
10.0000 mg | Freq: Once | INTRAMUSCULAR | Status: AC
  Administered 2019-11-30: 10 mg via INTRAVENOUS
  Filled 2019-11-30: qty 2

## 2019-11-30 NOTE — Evaluation (Signed)
Physical Therapy Evaluation Patient Details Name: Anita Hoffman MRN: [id number] DOB: 1961-05-03 Today's Date: 11/30/2019   History of Present Illness  59 y/o F with a PMH tobacco use and tubal ligation who presented to the ED with a cc epigastric/RUQ abdominal pain along with SOB for the last 3 weeks. 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 Pt reports independence in mobility, and ADLs, iADLs. Pt is currently limited in safe mobility decreased balance and activity tolerance. Pt is mod I for bed mobility and transfers and supervision for ambulation of 350 feet without AD. Pt will no need any additional PT services or equipment at discharge, however PT will continue to see pt acutely to improve balance and endurance.    Follow Up Recommendations No PT follow up    Equipment Recommendations  None recommended by PT       Precautions / Restrictions Precautions Precautions: None Restrictions Weight Bearing Restrictions: No      Mobility  Bed Mobility Overal bed mobility: Modified Independent             General bed mobility comments: HoB elevated and use of bedrail   Transfers Overall transfer level: Modified independent               General transfer comment: good power up and steadying, no dizziness or lightheadedness  Ambulation/Gait Ambulation/Gait assistance: Supervision Gait Distance (Feet): 350 Feet Assistive device: None Gait Pattern/deviations: Step-through pattern;Decreased step length - right;Decreased step length - left;WFL(Within Functional Limits) Gait velocity: slowed Gait velocity interpretation: >2.62 ft/sec, indicative of community ambulatory General Gait Details: supervision for safety, slow, mildly steady gait without AD         Balance Overall balance  assessment: Mild deficits observed, not formally tested                                           Pertinent Vitals/Pain Pain Assessment: No/denies pain    Home Living Family/patient expects to be discharged to:: Private residence Living Arrangements: Children Available Help at Discharge: Family;Available PRN/intermittently Type of Home: House Home Access: Level entry     Home Layout: One level Home Equipment: None      Prior Function Level of Independence: Independent         Comments: daughter does grocery shopping     Hand Dominance        Extremity/Trunk Assessment   Upper Extremity Assessment Upper Extremity Assessment: Overall WFL for tasks assessed    Lower Extremity Assessment Lower Extremity Assessment: Overall WFL for tasks assessed       Communication   Communication: No difficulties  Cognition Arousal/Alertness: Awake/alert Behavior During Therapy: WFL for tasks assessed/performed Overall Cognitive Status: Within Functional Limits for tasks assessed                                        General Comments General comments (skin integrity, edema, etc.): VSS on RA    Exercises     Assessment/Plan    PT Assessment Patient needs continued PT services  PT Problem List Decreased balance;Decreased activity tolerance       PT  Treatment Interventions DME instruction;Gait training;Functional mobility training;Therapeutic activities;Therapeutic exercise;Balance training;Cognitive remediation;Patient/family education    PT Goals (Current goals can be found in the Care Plan section)  Acute Rehab PT Goals Patient Stated Goal: get home PT Goal Formulation: With patient Time For Goal Achievement: 12/14/19 Potential to Achieve Goals: Good    Frequency Min 3X/week   Barriers to discharge        Co-evaluation               AM-PAC PT "6 Clicks" Mobility  Outcome Measure Help needed turning from your back  to your side while in a flat bed without using bedrails?: None Help needed moving from lying on your back to sitting on the side of a flat bed without using bedrails?: A Little Help needed moving to and from a bed to a chair (including a wheelchair)?: None Help needed standing up from a chair using your arms (e.g., wheelchair or bedside chair)?: None Help needed to walk in hospital room?: None Help needed climbing 3-5 steps with a railing? : A Little 6 Click Score: 22    End of Session Equipment Utilized During Treatment: Gait belt Activity Tolerance: Patient tolerated treatment well Patient left: in bed;with call bell/phone within reach;Other (comment)(IV team in room) Nurse Communication: Mobility status PT Visit Diagnosis: Other abnormalities of gait and mobility (R26.89)    Time: [id number] PT Time Calculation (min) (ACUTE ONLY): 20 min   Charges:   PT Evaluation $PT Eval Moderate Complexity: 1 Mod          Deetya Drouillard B. Migdalia Dk PT, DPT Acute Rehabilitation Services Pager [phone] Office [phone]   [place] 11/30/2019, 3:34 PM

## 2019-11-30 NOTE — Progress Notes (Signed)
Subjective: CC:  Patient reports abdominal pain, n/v have resolved. She is passing flatus. Tolerating soft diet. Last BM 2 days ago.   Objective: Vital signs in last 24 hours: Temp:  [98.9 F (37.2 C)-100 F (37.8 C)] 100 F (37.8 C) (04/30 1112) Pulse Rate:  [87-98] 98 (04/30 0521) Resp:  [16-24] 16 (04/29 1530) BP: (108-133)/(71-81) 114/78 (04/30 1112) SpO2:  [98 %-100 %] 98 % (04/30 0521) Weight:  [78.6 kg-96.1 kg] 78.6 kg (04/30 0521) Last BM Date: 11/27/19  Intake/Output from previous day: 04/29 0701 - 04/30 0700 In: 1977 [I.V.:977; IV Piggyback:1000] Out: -  Intake/Output this shift: No intake/output data recorded.  PE: Gen:  Alert, NAD, pleasant Pulm: Normal rate and effort  Abd: Soft, NT/ND, +BS Psych: A&Ox3  Skin: no rashes noted, warm and dry  Lab Results:  Recent Labs    11/29/19 1400 11/30/19 0309  WBC 11.0* 9.6  HGB 8.4* 7.4*  HCT 28.1* 23.8*  PLT 464* 441*   BMET Recent Labs    11/29/19 1400 11/30/19 0309  NA 133* 133*  K 3.8 3.4*  CL 97* 100  CO2 21* 24  GLUCOSE 100* 104*  BUN 7 6  CREATININE 0.61 0.61  CALCIUM 8.1* 7.6*   PT/INR No results for input(s): LABPROT, INR in the last 72 hours. CMP     Component Value Date/Time   NA 133 (L) 11/30/2019 0309   K 3.4 (L) 11/30/2019 0309   CL 100 11/30/2019 0309   CO2 24 11/30/2019 0309   GLUCOSE 104 (H) 11/30/2019 0309   BUN 6 11/30/2019 0309   CREATININE 0.61 11/30/2019 0309   CALCIUM 7.6 (L) 11/30/2019 0309   PROT 6.3 (L) 11/30/2019 0309   ALBUMIN 2.1 (L) 11/30/2019 0309   AST 53 (H) 11/30/2019 0309   ALT 19 11/30/2019 0309   ALKPHOS 162 (H) 11/30/2019 0309   BILITOT 0.9 11/30/2019 0309   GFRNONAA >60 11/30/2019 0309   GFRAA >60 11/30/2019 0309   Lipase     Component Value Date/Time   LIPASE 17 11/29/2019 0732       Studies/Results: DG Chest 2 View  Result Date: 11/28/2019 CLINICAL DATA:  59 year old female with history of chest and abdominal pain for the past  2 weeks. EXAM: CHEST - 2 VIEW COMPARISON:  No priors. FINDINGS: Lung volumes are normal. No consolidative airspace disease. No pleural effusions. No pneumothorax. No pulmonary nodule or mass noted. Pulmonary vasculature and the cardiomediastinal silhouette are within normal limits. IMPRESSION: No radiographic evidence of acute cardiopulmonary disease. Electronically Signed   By: Vinnie Langton M.D.   On: 11/28/2019 13:46   CT Angio Chest PE W/Cm &/Or Wo Cm  Result Date: 11/29/2019 CLINICAL DATA:  RIGHT upper quadrant and RIGHT lower chest pain, shortness of breath, lower extremity edema, former smoker EXAM: CT ANGIOGRAPHY CHEST CT ABDOMEN AND PELVIS WITH CONTRAST TECHNIQUE: Multidetector CT imaging of the chest was performed using the standard protocol during bolus administration of intravenous contrast. Multiplanar CT image reconstructions and MIPs were obtained to evaluate the vascular anatomy. Multidetector CT imaging of the abdomen and pelvis was performed using the standard protocol during bolus administration of intravenous contrast. CONTRAST:  135mL OMNIPAQUE IOHEXOL 300 MG/ML SOLN IV. No oral contrast. COMPARISON:  CT abdomen and pelvis 11/22/2006 FINDINGS: CTA CHEST FINDINGS Cardiovascular: Aorta normal caliber without aneurysm or dissection. Pulmonary arteries well opacified. Filling defects are seen within the pulmonary arteries bilaterally RIGHT greater than LEFT consistent with pulmonary emboli. These  are greatest in the RIGHT lower lobe but affect all remaining lobes as well. RV/LV ratio = 1.0, elevated, consistent with RIGHT heart strain. Mediastinum/Nodes: Base of cervical region normal appearance. Normal sized axillary nodes bilaterally. No thoracic adenopathy. Esophagus unremarkable. Lungs/Pleura: Linear subsegmental atelectasis LEFT lower lobe. Lungs otherwise clear. No pulmonary infiltrate, pleural effusion, or pneumothorax. Musculoskeletal: No acute osseous findings. Review of the MIP  images confirms the above findings. CT ABDOMEN and PELVIS FINDINGS Hepatobiliary: Multiple poorly defined mass lesions are seen throughout the liver consistent with extensive hepatic metastatic disease. Largest of these measure 6.6 x 4.2 cm lateral segment LEFT lobe image 43, 5.7 x 5.7 cm posterior RIGHT lobe image 34, anterior RIGHT lobe 7.2 x 5.0 cm image 33, and 7.6 x 7.1 cm anterior RIGHT lobe image 22. Gallbladder unremarkable. Pancreas: Normal appearance Spleen: Normal appearance Adrenals/Urinary Tract: Adrenal glands normal appearance. BILATERAL renal cysts. Kidneys, ureters, and bladder otherwise normal appearance. Stomach/Bowel: Normal appendix. Large mass identified at cecum extending into ileocecal valve region and terminal ileum measuring 5.6 x 4.0 x 4.5 cm consistent with colonic neoplasm. Thickening of terminal ileum without obstruction. Stomach decompressed. Remaining large and small bowel loops unremarkable. Vascular/Lymphatic: Minimal atherosclerotic calcification aorta. Aorta normal caliber. Few pelvic phleboliths. Mesenteric mass with minimal calcification 4.1 x 2.8 cm image 54 likely nodal metastasis. Additional enlarged pathologic node mesentery medial to cecal tip 1.7 x 1.5 cm. Normal sized inguinal lymph nodes. Reproductive: Uterus and adnexa unremarkable Other: Tiny umbilical hernia containing fat. No free air or free fluid. Musculoskeletal: Bones demineralized. Lipoma involving the distal RIGHT iliopsoas muscle/tendon, 3.6 x 2.5 cm x 8.9 cm length. Review of the MIP images confirms the above findings. IMPRESSION: Multiple BILATERAL pulmonary emboli RIGHT greater than LEFT. Positive for acute PE with CT evidence of right heart strain (RV/LV Ratio = 1.0) consistent with at least submassive (intermediate risk) PE; the presence of right heart strain has been associated with an increased risk of morbidity and mortality. Large cecal mass extending into ileocecal valve region and terminal ileum  measuring 5.6 x 4.0 x 4.5 cm consistent with colonic neoplasm. Multiple hepatic metastases. Mesenteric nodal metastases. Aortic Atherosclerosis (ICD10-I70.0). Critical Value/emergent results were called by telephone at the time of interpretation on 11/29/2019 at 12:24 pm to provider DR. ELIZABETH REES , who verbally acknowledged these results. Electronically Signed   By: Lavonia Dana M.D.   On: 11/29/2019 12:27   CT Abdomen Pelvis W Contrast  Result Date: 11/29/2019 CLINICAL DATA:  RIGHT upper quadrant and RIGHT lower chest pain, shortness of breath, lower extremity edema, former smoker EXAM: CT ANGIOGRAPHY CHEST CT ABDOMEN AND PELVIS WITH CONTRAST TECHNIQUE: Multidetector CT imaging of the chest was performed using the standard protocol during bolus administration of intravenous contrast. Multiplanar CT image reconstructions and MIPs were obtained to evaluate the vascular anatomy. Multidetector CT imaging of the abdomen and pelvis was performed using the standard protocol during bolus administration of intravenous contrast. CONTRAST:  127mL OMNIPAQUE IOHEXOL 300 MG/ML SOLN IV. No oral contrast. COMPARISON:  CT abdomen and pelvis 11/22/2006 FINDINGS: CTA CHEST FINDINGS Cardiovascular: Aorta normal caliber without aneurysm or dissection. Pulmonary arteries well opacified. Filling defects are seen within the pulmonary arteries bilaterally RIGHT greater than LEFT consistent with pulmonary emboli. These are greatest in the RIGHT lower lobe but affect all remaining lobes as well. RV/LV ratio = 1.0, elevated, consistent with RIGHT heart strain. Mediastinum/Nodes: Base of cervical region normal appearance. Normal sized axillary nodes bilaterally. No thoracic adenopathy. Esophagus unremarkable. Lungs/Pleura:  Linear subsegmental atelectasis LEFT lower lobe. Lungs otherwise clear. No pulmonary infiltrate, pleural effusion, or pneumothorax. Musculoskeletal: No acute osseous findings. Review of the MIP images confirms the  above findings. CT ABDOMEN and PELVIS FINDINGS Hepatobiliary: Multiple poorly defined mass lesions are seen throughout the liver consistent with extensive hepatic metastatic disease. Largest of these measure 6.6 x 4.2 cm lateral segment LEFT lobe image 43, 5.7 x 5.7 cm posterior RIGHT lobe image 34, anterior RIGHT lobe 7.2 x 5.0 cm image 33, and 7.6 x 7.1 cm anterior RIGHT lobe image 22. Gallbladder unremarkable. Pancreas: Normal appearance Spleen: Normal appearance Adrenals/Urinary Tract: Adrenal glands normal appearance. BILATERAL renal cysts. Kidneys, ureters, and bladder otherwise normal appearance. Stomach/Bowel: Normal appendix. Large mass identified at cecum extending into ileocecal valve region and terminal ileum measuring 5.6 x 4.0 x 4.5 cm consistent with colonic neoplasm. Thickening of terminal ileum without obstruction. Stomach decompressed. Remaining large and small bowel loops unremarkable. Vascular/Lymphatic: Minimal atherosclerotic calcification aorta. Aorta normal caliber. Few pelvic phleboliths. Mesenteric mass with minimal calcification 4.1 x 2.8 cm image 54 likely nodal metastasis. Additional enlarged pathologic node mesentery medial to cecal tip 1.7 x 1.5 cm. Normal sized inguinal lymph nodes. Reproductive: Uterus and adnexa unremarkable Other: Tiny umbilical hernia containing fat. No free air or free fluid. Musculoskeletal: Bones demineralized. Lipoma involving the distal RIGHT iliopsoas muscle/tendon, 3.6 x 2.5 cm x 8.9 cm length. Review of the MIP images confirms the above findings. IMPRESSION: Multiple BILATERAL pulmonary emboli RIGHT greater than LEFT. Positive for acute PE with CT evidence of right heart strain (RV/LV Ratio = 1.0) consistent with at least submassive (intermediate risk) PE; the presence of right heart strain has been associated with an increased risk of morbidity and mortality. Large cecal mass extending into ileocecal valve region and terminal ileum measuring 5.6 x 4.0 x  4.5 cm consistent with colonic neoplasm. Multiple hepatic metastases. Mesenteric nodal metastases. Aortic Atherosclerosis (ICD10-I70.0). Critical Value/emergent results were called by telephone at the time of interpretation on 11/29/2019 at 12:24 pm to provider DR. ELIZABETH REES , who verbally acknowledged these results. Electronically Signed   By: Lavonia Dana M.D.   On: 11/29/2019 12:27   ECHOCARDIOGRAM COMPLETE  Result Date: 11/29/2019    ECHOCARDIOGRAM REPORT   Patient Name:   Anita Hoffman Date of Exam: 11/29/2019 Medical Rec #:  [id number]         Height:       62.0 in Accession #:    [id number]        Weight:       210.0 lb Date of Birth:  29-Dec-1960         BSA:          1.952 m Patient Age:    59 years          BP:           133/77 mmHg Patient Gender: F                 HR:           97 bpm. Exam Location:  Inpatient Procedure: 2D Echo Indications:    Pulmonary Embolus I26.99  History:        Patient has no prior history of Echocardiogram examinations.                 Risk Factors:Former Smoker.  Sonographer:    Mikki Santee RDCS (AE) Referring Phys: [id number] Michell Heinrich PAHWANI IMPRESSIONS  1. Left ventricular ejection fraction, by  estimation, is 60 to 65%. The left ventricle has normal function. The left ventricle has no regional wall motion abnormalities. There is mild concentric left ventricular hypertrophy. Left ventricular diastolic parameters are consistent with Grade I diastolic dysfunction (impaired relaxation).  2. Right ventricular systolic function is normal. The right ventricular size is normal. There is normal pulmonary artery systolic pressure.  3. The mitral valve is normal in structure. No evidence of mitral valve regurgitation. No evidence of mitral stenosis.  4. The aortic valve is normal in structure. Aortic valve regurgitation is not visualized. No aortic stenosis is present.  5. The inferior vena cava is normal in size with greater than 50% respiratory variability, suggesting  right atrial pressure of 3 mmHg. FINDINGS  Left Ventricle: Left ventricular ejection fraction, by estimation, is 60 to 65%. The left ventricle has normal function. The left ventricle has no regional wall motion abnormalities. The left ventricular internal cavity size was normal in size. There is  mild concentric left ventricular hypertrophy. Left ventricular diastolic parameters are consistent with Grade I diastolic dysfunction (impaired relaxation). Right Ventricle: The right ventricular size is normal. No increase in right ventricular wall thickness. Right ventricular systolic function is normal. There is normal pulmonary artery systolic pressure. The tricuspid regurgitant velocity is 2.07 m/s, and  with an assumed right atrial pressure of 3 mmHg, the estimated right ventricular systolic pressure is [id number]. Left Atrium: Left atrial size was normal in size. Right Atrium: Right atrial size was normal in size. Pericardium: Trivial pericardial effusion is present. Mitral Valve: The mitral valve is normal in structure. Normal mobility of the mitral valve leaflets. No evidence of mitral valve regurgitation. No evidence of mitral valve stenosis. Tricuspid Valve: The tricuspid valve is normal in structure. Tricuspid valve regurgitation is trivial. No evidence of tricuspid stenosis. Aortic Valve: The aortic valve is normal in structure. Aortic valve regurgitation is not visualized. No aortic stenosis is present. Pulmonic Valve: The pulmonic valve was normal in structure. Pulmonic valve regurgitation is not visualized. No evidence of pulmonic stenosis. Aorta: The aortic root is normal in size and structure. Venous: The inferior vena cava is normal in size with greater than 50% respiratory variability, suggesting right atrial pressure of 3 mmHg. IAS/Shunts: No atrial level shunt detected by color flow Doppler.  LEFT VENTRICLE PLAX 2D LVIDd:         4.07 cm  Diastology LVIDs:         2.36 cm  LV e' lateral:   9.46 cm/s LV  PW:         1.28 cm  LV E/e' lateral: 7.3 LV IVS:        1.01 cm  LV e' medial:    8.16 cm/s LVOT diam:     2.00 cm  LV E/e' medial:  8.4 LV SV:         55 LV SV Index:   28 LVOT Area:     3.14 cm  RIGHT VENTRICLE RV S prime:     18.60 cm/s TAPSE (M-mode): 1.4 cm LEFT ATRIUM             Index       RIGHT ATRIUM           Index LA diam:        3.20 cm 1.64 cm/m  RA Area:     10.60 cm LA Vol (A2C):   21.4 ml 10.96 ml/m RA Volume:   19.60 ml  10.04 ml/m LA Vol (A4C):  37.3 ml 19.11 ml/m LA Biplane Vol: 31.1 ml 15.93 ml/m  AORTIC VALVE LVOT Vmax:   117.00 cm/s LVOT Vmean:  67.500 cm/s LVOT VTI:    0.176 m  AORTA Ao Root diam: 2.60 cm MITRAL VALVE               TRICUSPID VALVE MV Area (PHT): 5.42 cm    TR Peak grad:   17.1 mmHg MV Decel Time: 140 msec    TR Vmax:        207.00 cm/s MV E velocity: 68.90 cm/s MV A velocity: 82.00 cm/s  SHUNTS MV E/A ratio:  0.84        Systemic VTI:  0.18 m                            Systemic Diam: 2.00 cm Skeet Latch MD Electronically signed by Skeet Latch MD Signature Date/Time: 11/29/2019/6:13:07 PM    Final    VAS [place] LOWER EXTREMITY VENOUS (DVT)  Result Date: 11/29/2019  Lower Venous DVTStudy Indications: Pulmonary embolism.  Comparison Study: no prior Performing Technologist: Abram Sander RVS  Examination Guidelines: A complete evaluation includes B-mode imaging, spectral Doppler, color Doppler, and power Doppler as needed of all accessible portions of each vessel. Bilateral testing is considered an integral part of a complete examination. Limited examinations for reoccurring indications may be performed as noted. The reflux portion of the exam is performed with the patient in reverse Trendelenburg.  +---------+---------------+---------+-----------+----------+--------------+ RIGHT    CompressibilityPhasicitySpontaneityPropertiesThrombus Aging +---------+---------------+---------+-----------+----------+--------------+ CFV      Full           Yes       Yes                                 +---------+---------------+---------+-----------+----------+--------------+ SFJ      Full                                                        +---------+---------------+---------+-----------+----------+--------------+ FV Prox  Full                                                        +---------+---------------+---------+-----------+----------+--------------+ FV Mid   Full                                                        +---------+---------------+---------+-----------+----------+--------------+ FV DistalFull                                                        +---------+---------------+---------+-----------+----------+--------------+ PFV      Full                                                        +---------+---------------+---------+-----------+----------+--------------+  POP      Full           Yes      Yes                                 +---------+---------------+---------+-----------+----------+--------------+ PTV      Full                                                        +---------+---------------+---------+-----------+----------+--------------+ PERO     Full                                                        +---------+---------------+---------+-----------+----------+--------------+   +---------+---------------+---------+-----------+----------+--------------+ LEFT     CompressibilityPhasicitySpontaneityPropertiesThrombus Aging +---------+---------------+---------+-----------+----------+--------------+ CFV      Full           Yes      Yes                                 +---------+---------------+---------+-----------+----------+--------------+ SFJ      Full                                                        +---------+---------------+---------+-----------+----------+--------------+ FV Prox  Full                                                         +---------+---------------+---------+-----------+----------+--------------+ FV Mid   Full                                                        +---------+---------------+---------+-----------+----------+--------------+ FV DistalFull                                                        +---------+---------------+---------+-----------+----------+--------------+ PFV      Full                                                        +---------+---------------+---------+-----------+----------+--------------+ POP      Full           Yes      Yes                                 +---------+---------------+---------+-----------+----------+--------------+  PTV      Full                                                        +---------+---------------+---------+-----------+----------+--------------+ PERO                                                  Not visualized +---------+---------------+---------+-----------+----------+--------------+ Gastroc  None                                         Acute          +---------+---------------+---------+-----------+----------+--------------+     Summary: RIGHT: - There is no evidence of deep vein thrombosis in the lower extremity.  - No cystic structure found in the popliteal fossa.  LEFT: - Findings consistent with acute deep vein thrombosis involving the left gastrocnemius veins. - No cystic structure found in the popliteal fossa.  *See table(s) above for measurements and observations. Electronically signed by Curt Jews MD on 11/29/2019 at 4:25:09 PM.    Final    US Abdomen Limited RUQ  Result Date: 11/29/2019 CLINICAL DATA:  Right upper quadrant pain 3 months. EXAM: ULTRASOUND ABDOMEN LIMITED RIGHT UPPER QUADRANT COMPARISON:  05/08/2007 FINDINGS: Gallbladder: Significant cholelithiasis with largest stone measuring 1.1 cm. Gallbladder wall thickening measuring 5.5 mm. Negative sonographic Murphy sign. No adjacent free fluid.  Common bile duct: Diameter: 5.7 mm. Liver: Heterogeneous parenchymal echogenicity with numerous liver masses many of which have a more hyperechoic periphery with more hypoechoic appearance centrally suggesting a somewhat target appearance. There is a large irregular shape cystic lesion with echogenic periphery over the right lobe measuring approximately 7.3 cm. Portal vein is patent on color Doppler imaging with normal direction of blood flow towards the liver. Other: None. IMPRESSION: 1. Moderate to severe cholelithiasis with mild gallbladder wall thickening of 5.5 mm. Recommend clinical correlation as findings could be seen with acute cholecystitis. 2. Numerous liver masses with the largest over the right lobe measuring 7.3 cm containing central cystic/necrotic area. Findings are highly suspicious for metastatic disease. Recommend clinical correlation as further evaluation with chest/abdominal/pelvic CT may be helpful to search for primary malignancy. Electronically Signed   By: Marin Olp M.D.   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 She denies risk factors and states understanding of this.    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 No indication for emergency resection - GI following and planning coloscopy for tomorrow  - Recommend oncology eval - CEA ordered  RUQ pain Cholelithiasis  - While the patient has cholelithiasis and may have a history of biliary colic, I do not think she has cholecystitis. I believe the significant mass lesions of the liver are causing her RUQ pain and her distended gallbladder is a result of underlying liver dysfunction and edema. I do not  recommend any further workup  for cholecystitis. Her abdominal pain, n/v has resolved and she is tolerating a diet.   FEN - Okay w/ diet from our standpoint. Diet per GI VTE - SCDs, Heparin drip ID - None  Plan: As above. We will follow peripherally over the weekend and see again on Monday. Please do not hesitate to call sooner with any questions or concerns.    LOS: 1 day    Jillyn Ledger , [facility] 11/30/2019, 11:28 AM Please see Amion for pager number during day hours 7:00am-4:30pm

## 2019-11-30 NOTE — TOC Initial Note (Signed)
Transition of Care [facility]) - Initial/Assessment Note    Patient Details  Name: Anita Hoffman MRN: [id number] Date of Birth: 02-Mar-1961  Transition of Care [facility]) CM/SW Contact:    Bethena Roys, RN Phone Number: 11/30/2019, 1:48 PM  Clinical Narrative: Patient presented for right upper quadrant pain and shortness of breath. Prior to arrival patient was from home with daughter and the daughters four children. Patient is without insurance and primary care provider. Case Manager discussed the three local clinics with the patient and she is agreeable to Case Manager making a follow up appointment. Case Manager will continue to follow before establishing appointment-unsure of discharge date at this time. Patient will probably need MATCH for medication assistance prior to transition home.               Expected Discharge Plan: Home/Self Care Barriers to Discharge: Continued Medical Work up   Patient Goals and CMS Choice Patient states their goals for this hospitalization and ongoing recovery are:: to return home      Expected Discharge Plan and Services Expected Discharge Plan: Home/Self Care In-house Referral: NA Discharge Planning Services: CM Consult, Follow-up appt scheduled, [facility], Medication Assistance, [facility] will be established.) Post Acute Care Choice: NA Living arrangements for the past 2 months: Single Family Home                 DME Arranged: N/A     Prior Living Arrangements/Services Living arrangements for the past 2 months: Single Family Home Lives with:: Adult Children, Relatives Patient language and need for interpreter reviewed:: Yes Do you feel safe going back to the place where you live?: Yes      Need for Family Participation in Patient Care: Yes (Comment) Care giver support system in place?: Yes (comment)   Criminal Activity/Legal Involvement Pertinent to Current Situation/Hospitalization: No - Comment as  needed  Activities of Daily Living Home Assistive Devices/Equipment: None ADL Screening (condition at time of admission) Patient's cognitive ability adequate to safely complete daily activities?: Yes Is the patient deaf or have difficulty hearing?: No Does the patient have difficulty seeing, even when wearing glasses/contacts?: No Does the patient have difficulty concentrating, remembering, or making decisions?: No Patient able to express need for assistance with ADLs?: No Does the patient have difficulty dressing or bathing?: No Independently performs ADLs?: Yes (appropriate for developmental age) Does the patient have difficulty walking or climbing stairs?: No Weakness of Legs: None Weakness of Arms/Hands: None  Permission Sought/Granted Permission sought to share information with : Family Supports, Customer service manager   Emotional Assessment Appearance:: Appears stated age Attitude/Demeanor/Rapport: Engaged Affect (typically observed): Appropriate Orientation: : Oriented to Situation, Oriented to Place, Oriented to Self, Oriented to  Time Alcohol / Substance Use: Not Applicable Psych Involvement: No (comment)  Admission diagnosis:  RUQ abdominal pain [R10.11] Bilateral pulmonary embolism (HCC) [I26.99] Other acute pulmonary embolism with acute cor pulmonale (HCC) [I26.09] Metastatic malignant neoplasm, unspecified site [facility]) [C79.9] Patient Active Problem List   Diagnosis Date Noted  . Bilateral pulmonary embolism ([place]) 11/29/2019  . 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 Liver metastases ([place]) 11/29/2019  . Elevated troponin 11/29/2019  . ANXIETY, SITUATIONAL 10/13/2009  . DRY MOUTH 10/03/2009  . PALPITATIONS, RECURRENT 10/03/2009  . TOBACCO USE, QUIT 09/29/2006   PCP:  Patient, No Pcp Per Pharmacy:   [facility] [place], [address]  [place] [address] Phone: [phone]  Fax: [phone]    Readmission Risk Interventions No flowsheet data found.

## 2019-11-30 NOTE — H&P (View-Only) (Signed)
[facility] Consult: 8:23 AM 11/30/2019  LOS: 1 day    Referring Provider: Dr Reesa Chew  Primary Care Physician:  Patient, No Pcp Per Primary Gastroenterologist:  unassigned     Reason for Consultation:  Metastatic colon cancer   HPI: Anita Hoffman is a 59 y.o. female. No sig past hx.  Previous tubal ligation.  Former smoker. No previous colonoscopy, EGD.  3 weeks of right upper abdominal pain, relieved with occasional Tylenol but persistent.  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  Weight baseline 210#baseline, 173 #currently.  She believes he is lost the weight over just a few weeks ago she denies anorexia, diminished p.o. intake, nausea.  However she did throw up nonbloody emesis once yesterday.  No constipation, no bloody or black stools.  Abdominal ultrasound: Severe cholelithiasis, mild GB wall thickening.  Numerous liver masses, largest in the right lobe is 7.3 cm with central cystic/necrosis. CT angio chest and CT abdomen pelvis: Multiple bilateral PE.  Evidence of right heart strain.  Large cecal mass can consistent with neoplasm.  Mets to liver and mesenteric nodes. DVT left gastrocnemius per Dopplers.   2D echo with LVEF 60 to 65%.  Grade 1 DD.  Normal pulmonary pressures.  No valvular abnormalities T bili 1.2, alkaline phosphatase 175.  AST/ALT 59/24.  Lipase 17.  Sodium 133.  No renal insufficiency.  Potassium 3.1 >> 3.8 >> 3.4. Hb 8.6 >> 7.4.  MCV 77.  Platelets, WBCs normal. 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  Social history: She was fired last year after 12 years at [employer].  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  Family history her father had lung cancer, he was a smoker.  Her maternal grandmother had ovarian  cancer.  No awareness of any colorectal or gastric cancers, ulcers, anemia, GI bleeding.    Past Medical History:  Diagnosis Date  . Former smoker   . Medical history non-contributory     Past Surgical History:  Procedure Laterality Date  . TUBAL LIGATION      Prior to Admission medications   Not on File    Scheduled Meds:  Infusions: . sodium chloride 75 mL/hr at 11/30/19 0418  . heparin 1,650 Units/hr (11/30/19 0419)   PRN Meds: acetaminophen **OR** acetaminophen, HYDROmorphone (DILAUDID) injection, ondansetron **OR** ondansetron (ZOFRAN) IV   Allergies as of 11/28/2019  . (No Known Allergies)    History reviewed. No pertinent family history.  Social History   Socioeconomic History  . Marital status: Single    Spouse name: Not on file  . Number of children: Not on file  . Years of education: Not on file  . Highest education level: Not on file  Occupational History  . Not on file  Tobacco Use  . Smoking status: Former Smoker    Packs/day: 0.50    Types: Cigarettes    Quit date: 11/29/2003    Years since quitting: 16.0  . Smokeless tobacco: Never Used  Substance and  Sexual Activity  . Alcohol use: Not Currently    Comment: 1990  . Drug use: Yes    Types: Marijuana    Comment: 1990  . Sexual activity: Not Currently  Other Topics Concern  . Not on file  Social History Narrative  . Not on file   Social Determinants of Health   Financial Resource Strain:   . Difficulty of Paying Living Expenses:   Food Insecurity:   . Worried About Charity fundraiser in the Last Year:   . Arboriculturist in the Last Year:   Transportation Needs:   . Film/video editor (Medical):   Marland Kitchen Lack of Transportation (Non-Medical):   Physical Activity:   . Days of Exercise per Week:   . Minutes of Exercise per Session:   Stress:   . Feeling of Stress :   Social Connections:   . Frequency of Communication with Friends and Family:   . Frequency of Social Gatherings  with Friends and Family:   . Attends Religious Services:   . Active Member of Clubs or Organizations:   . Attends Archivist Meetings:   Marland Kitchen Marital Status:   Intimate Partner Violence:   . Fear of Current or Ex-Partner:   . Emotionally Abused:   Marland Kitchen Physically Abused:   . Sexually Abused:     REVIEW OF SYSTEMS: Constitutional: Fatigue, weakness. ENT:  No nose bleeds Pulm: Dyspnea on exertion, not profound.  No cough. CV:  No palpitations, no LE edema.  No angina. GU:  No hematuria, no frequency GI: No dysphagia.  No heartburn.  See HPI for other details Heme: Bruises fairly easily but no unusual bleeding.  No history anemia. Transfusions: None. Neuro:  No headaches, no peripheral tingling or numbness.  Slight positional dizziness at times. Derm:  No itching, no rash or sores.  Endocrine:  No sweats or chills.  No polyuria or dysuria Immunization:   Not queried. Travel:  None beyond local counties in last few months.    PHYSICAL EXAM: Vital signs in last 24 hours: Vitals:   11/30/19 0521 11/30/19 0746  BP: 114/81 114/76  Pulse: 98   Resp:    Temp: 99.4 F (37.4 C) 99.6 F (37.6 C)  SpO2: 98%    Wt Readings from Last 3 Encounters:  11/30/19 78.6 kg    General: Pleasant, well-appearing, comfortable.  Appears stated age. Head: No facial asymmetry or swelling.  No signs of head trauma Eyes: No scleral icterus.  No conjunctival pallor.  EOMI. Ears: Not hard of hearing Nose: No congestion or discharge. Mouth: Moist, clear, pink oral mucosa.  Tongue midline.  Fair dentition. Neck: No JVD, no masses, no thyromegaly Lungs: Clear bilaterally.  No labored breathing, no cough. Heart: RRR.  No MRG.  S1, S2 present Abdomen: Soft.  Minor RUQ tenderness.  Bowel sounds active.  No HSM, masses, bruits, hernias..   Rectal: Deferred Musc/Skeltl: No joint redness, swelling or gross deformity. Extremities: No CCE. Neurologic: Alert.  Oriented x3.  Appropriate.  Moves all 4  limbs, no tremors or gross weakness though strength was not formally tested. Skin: No rash, no sores, no telangiectasia. Nodes: No cervical adenopathy Psych: Calm, cooperative, pleasant, fluid speech.  Intake/Output from previous day: 04/29 0701 - 04/30 0700 In: 1977 [I.V.:977; IV Piggyback:1000] Out: -  Intake/Output this shift: No intake/output data recorded.  LAB RESULTS: Recent Labs    11/28/19 1309 11/29/19 1400 11/30/19 0309  WBC 10.7* 11.0* 9.6  HGB 8.6*  8.4* 7.4*  HCT 28.4* 28.1* 23.8*  PLT 467* 464* 441*   BMET Lab Results  Component Value Date   NA 133 (L) 11/30/2019   NA 133 (L) 11/29/2019   NA 133 (L) 11/28/2019   K 3.4 (L) 11/30/2019   K 3.8 11/29/2019   K 3.1 (L) 11/28/2019   CL 100 11/30/2019   CL 97 (L) 11/29/2019   CL 94 (L) 11/28/2019   CO2 24 11/30/2019   CO2 21 (L) 11/29/2019   CO2 24 11/28/2019   GLUCOSE 104 (H) 11/30/2019   GLUCOSE 100 (H) 11/29/2019   GLUCOSE 129 (H) 11/28/2019   BUN 6 11/30/2019   BUN 7 11/29/2019   BUN 6 11/28/2019   CREATININE 0.61 11/30/2019   CREATININE 0.61 11/29/2019   CREATININE 0.77 11/28/2019   CALCIUM 7.6 (L) 11/30/2019   CALCIUM 8.1 (L) 11/29/2019   CALCIUM 8.4 (L) 11/28/2019   LFT Recent Labs    11/29/19 0732 11/29/19 1400 11/30/19 0309  PROT 7.6 7.2 6.3*  ALBUMIN 2.6* 2.5* 2.1*  AST 59* 56* 53*  ALT 24 21 19   ALKPHOS 175* 176* 162*  BILITOT 1.2 1.2 0.9  BILIDIR 0.2  --   --   IBILI 1.0*  --   --    PT/INR No results found for: INR, PROTIME Hepatitis Panel No results for input(s): HEPBSAG, HCVAB, HEPAIGM, HEPBIGM in the last 72 hours. C-Diff No components found for: CDIFF Lipase     Component Value Date/Time   LIPASE 17 11/29/2019 0732    Drugs of Abuse  No results found for: LABOPIA, COCAINSCRNUR, LABBENZ, AMPHETMU, THCU, LABBARB   RADIOLOGY STUDIES: DG Chest 2 View  Result Date: 11/28/2019 CLINICAL DATA:  59 year old female with history of chest and abdominal pain for the past 2  weeks. EXAM: CHEST - 2 VIEW COMPARISON:  No priors. FINDINGS: Lung volumes are normal. No consolidative airspace disease. No pleural effusions. No pneumothorax. No pulmonary nodule or mass noted. Pulmonary vasculature and the cardiomediastinal silhouette are within normal limits. IMPRESSION: No radiographic evidence of acute cardiopulmonary disease. Electronically Signed   By: Vinnie Langton M.D.   On: 11/28/2019 13:46   CT Angio Chest PE W/Cm &/Or Wo Cm  Result Date: 11/29/2019 CLINICAL DATA:  RIGHT upper quadrant and RIGHT lower chest pain, shortness of breath, lower extremity edema, former smoker EXAM: CT ANGIOGRAPHY CHEST CT ABDOMEN AND PELVIS WITH CONTRAST TECHNIQUE: Multidetector CT imaging of the chest was performed using the standard protocol during bolus administration of intravenous contrast. Multiplanar CT image reconstructions and MIPs were obtained to evaluate the vascular anatomy. Multidetector CT imaging of the abdomen and pelvis was performed using the standard protocol during bolus administration of intravenous contrast. CONTRAST:  178mL OMNIPAQUE IOHEXOL 300 MG/ML SOLN IV. No oral contrast. COMPARISON:  CT abdomen and pelvis 11/22/2006 FINDINGS: CTA CHEST FINDINGS Cardiovascular: Aorta normal caliber without aneurysm or dissection. Pulmonary arteries well opacified. Filling defects are seen within the pulmonary arteries bilaterally RIGHT greater than LEFT consistent with pulmonary emboli. These are greatest in the RIGHT lower lobe but affect all remaining lobes as well. RV/LV ratio = 1.0, elevated, consistent with RIGHT heart strain. Mediastinum/Nodes: Base of cervical region normal appearance. Normal sized axillary nodes bilaterally. No thoracic adenopathy. Esophagus unremarkable. Lungs/Pleura: Linear subsegmental atelectasis LEFT lower lobe. Lungs otherwise clear. No pulmonary infiltrate, pleural effusion, or pneumothorax. Musculoskeletal: No acute osseous findings. Review of the MIP images  confirms the above findings. CT ABDOMEN and PELVIS FINDINGS Hepatobiliary: Multiple poorly defined mass  lesions are seen throughout the liver consistent with extensive hepatic metastatic disease. Largest of these measure 6.6 x 4.2 cm lateral segment LEFT lobe image 43, 5.7 x 5.7 cm posterior RIGHT lobe image 34, anterior RIGHT lobe 7.2 x 5.0 cm image 33, and 7.6 x 7.1 cm anterior RIGHT lobe image 22. Gallbladder unremarkable. Pancreas: Normal appearance Spleen: Normal appearance Adrenals/Urinary Tract: Adrenal glands normal appearance. BILATERAL renal cysts. Kidneys, ureters, and bladder otherwise normal appearance. Stomach/Bowel: Normal appendix. Large mass identified at cecum extending into ileocecal valve region and terminal ileum measuring 5.6 x 4.0 x 4.5 cm consistent with colonic neoplasm. Thickening of terminal ileum without obstruction. Stomach decompressed. Remaining large and small bowel loops unremarkable. Vascular/Lymphatic: Minimal atherosclerotic calcification aorta. Aorta normal caliber. Few pelvic phleboliths. Mesenteric mass with minimal calcification 4.1 x 2.8 cm image 54 likely nodal metastasis. Additional enlarged pathologic node mesentery medial to cecal tip 1.7 x 1.5 cm. Normal sized inguinal lymph nodes. Reproductive: Uterus and adnexa unremarkable Other: Tiny umbilical hernia containing fat. No free air or free fluid. Musculoskeletal: Bones demineralized. Lipoma involving the distal RIGHT iliopsoas muscle/tendon, 3.6 x 2.5 cm x 8.9 cm length. Review of the MIP images confirms the above findings. IMPRESSION: Multiple BILATERAL pulmonary emboli RIGHT greater than LEFT. Positive for acute PE with CT evidence of right heart strain (RV/LV Ratio = 1.0) consistent with at least submassive (intermediate risk) PE; the presence of right heart strain has been associated with an increased risk of morbidity and mortality. Large cecal mass extending into ileocecal valve region and terminal ileum measuring  5.6 x 4.0 x 4.5 cm consistent with colonic neoplasm. Multiple hepatic metastases. Mesenteric nodal metastases. Aortic Atherosclerosis (ICD10-I70.0). Critical Value/emergent results were called by telephone at the time of interpretation on 11/29/2019 at 12:24 pm to provider DR. ELIZABETH REES , who verbally acknowledged these results. Electronically Signed   By: Lavonia Dana M.D.   On: 11/29/2019 12:27   CT Abdomen Pelvis W Contrast  Result Date: 11/29/2019 CLINICAL DATA:  RIGHT upper quadrant and RIGHT lower chest pain, shortness of breath, lower extremity edema, former smoker EXAM: CT ANGIOGRAPHY CHEST CT ABDOMEN AND PELVIS WITH CONTRAST TECHNIQUE: Multidetector CT imaging of the chest was performed using the standard protocol during bolus administration of intravenous contrast. Multiplanar CT image reconstructions and MIPs were obtained to evaluate the vascular anatomy. Multidetector CT imaging of the abdomen and pelvis was performed using the standard protocol during bolus administration of intravenous contrast. CONTRAST:  155mL OMNIPAQUE IOHEXOL 300 MG/ML SOLN IV. No oral contrast. COMPARISON:  CT abdomen and pelvis 11/22/2006 FINDINGS: CTA CHEST FINDINGS Cardiovascular: Aorta normal caliber without aneurysm or dissection. Pulmonary arteries well opacified. Filling defects are seen within the pulmonary arteries bilaterally RIGHT greater than LEFT consistent with pulmonary emboli. These are greatest in the RIGHT lower lobe but affect all remaining lobes as well. RV/LV ratio = 1.0, elevated, consistent with RIGHT heart strain. Mediastinum/Nodes: Base of cervical region normal appearance. Normal sized axillary nodes bilaterally. No thoracic adenopathy. Esophagus unremarkable. Lungs/Pleura: Linear subsegmental atelectasis LEFT lower lobe. Lungs otherwise clear. No pulmonary infiltrate, pleural effusion, or pneumothorax. Musculoskeletal: No acute osseous findings. Review of the MIP images confirms the above  findings. CT ABDOMEN and PELVIS FINDINGS Hepatobiliary: Multiple poorly defined mass lesions are seen throughout the liver consistent with extensive hepatic metastatic disease. Largest of these measure 6.6 x 4.2 cm lateral segment LEFT lobe image 43, 5.7 x 5.7 cm posterior RIGHT lobe image 34, anterior RIGHT lobe 7.2 x 5.0 cm  image 33, and 7.6 x 7.1 cm anterior RIGHT lobe image 22. Gallbladder unremarkable. Pancreas: Normal appearance Spleen: Normal appearance Adrenals/Urinary Tract: Adrenal glands normal appearance. BILATERAL renal cysts. Kidneys, ureters, and bladder otherwise normal appearance. Stomach/Bowel: Normal appendix. Large mass identified at cecum extending into ileocecal valve region and terminal ileum measuring 5.6 x 4.0 x 4.5 cm consistent with colonic neoplasm. Thickening of terminal ileum without obstruction. Stomach decompressed. Remaining large and small bowel loops unremarkable. Vascular/Lymphatic: Minimal atherosclerotic calcification aorta. Aorta normal caliber. Few pelvic phleboliths. Mesenteric mass with minimal calcification 4.1 x 2.8 cm image 54 likely nodal metastasis. Additional enlarged pathologic node mesentery medial to cecal tip 1.7 x 1.5 cm. Normal sized inguinal lymph nodes. Reproductive: Uterus and adnexa unremarkable Other: Tiny umbilical hernia containing fat. No free air or free fluid. Musculoskeletal: Bones demineralized. Lipoma involving the distal RIGHT iliopsoas muscle/tendon, 3.6 x 2.5 cm x 8.9 cm length. Review of the MIP images confirms the above findings. IMPRESSION: Multiple BILATERAL pulmonary emboli RIGHT greater than LEFT. Positive for acute PE with CT evidence of right heart strain (RV/LV Ratio = 1.0) consistent with at least submassive (intermediate risk) PE; the presence of right heart strain has been associated with an increased risk of morbidity and mortality. Large cecal mass extending into ileocecal valve region and terminal ileum measuring 5.6 x 4.0 x 4.5 cm  consistent with colonic neoplasm. Multiple hepatic metastases. Mesenteric nodal metastases. Aortic Atherosclerosis (ICD10-I70.0). Critical Value/emergent results were called by telephone at the time of interpretation on 11/29/2019 at 12:24 pm to provider DR. ELIZABETH REES , who verbally acknowledged these results. Electronically Signed   By: Lavonia Dana M.D.   On: 11/29/2019 12:27   ECHOCARDIOGRAM COMPLETE  Result Date: 11/29/2019    ECHOCARDIOGRAM REPORT   Patient Name:   Kristie Cowman Date of Exam: 11/29/2019 Medical Rec #:  [id number]         Height:       62.0 in Accession #:    [id number]        Weight:       210.0 lb Date of Birth:  February 16, 1961         BSA:          1.952 m Patient Age:    31 years          BP:           133/77 mmHg Patient Gender: F                 HR:           97 bpm. Exam Location:  Inpatient Procedure: 2D Echo Indications:    Pulmonary Embolus I26.99  History:        Patient has no prior history of Echocardiogram examinations.                 Risk Factors:Former Smoker.  Sonographer:    Mikki Santee RDCS (AE) Referring Phys: [id number] Michell Heinrich PAHWANI IMPRESSIONS  1. Left ventricular ejection fraction, by estimation, is 60 to 65%. The left ventricle has normal function. The left ventricle has no regional wall motion abnormalities. There is mild concentric left ventricular hypertrophy. Left ventricular diastolic parameters are consistent with Grade I diastolic dysfunction (impaired relaxation).  2. Right ventricular systolic function is normal. The right ventricular size is normal. There is normal pulmonary artery systolic pressure.  3. The mitral valve is normal in structure. No evidence of mitral valve regurgitation. No evidence of mitral stenosis.  4.  The aortic valve is normal in structure. Aortic valve regurgitation is not visualized. No aortic stenosis is present.  5. The inferior vena cava is normal in size with greater than 50% respiratory variability, suggesting right  atrial pressure of 3 mmHg. FINDINGS  Left Ventricle: Left ventricular ejection fraction, by estimation, is 60 to 65%. The left ventricle has normal function. The left ventricle has no regional wall motion abnormalities. The left ventricular internal cavity size was normal in size. There is  mild concentric left ventricular hypertrophy. Left ventricular diastolic parameters are consistent with Grade I diastolic dysfunction (impaired relaxation). Right Ventricle: The right ventricular size is normal. No increase in right ventricular wall thickness. Right ventricular systolic function is normal. There is normal pulmonary artery systolic pressure. The tricuspid regurgitant velocity is 2.07 m/s, and  with an assumed right atrial pressure of 3 mmHg, the estimated right ventricular systolic pressure is [id number] mmHg. Left Atrium: Left atrial size was normal in size. Right Atrium: Right atrial size was normal in size. Pericardium: Trivial pericardial effusion is present. Mitral Valve: The mitral valve is normal in structure. Normal mobility of the mitral valve leaflets. No evidence of mitral valve regurgitation. No evidence of mitral valve stenosis. Tricuspid Valve: The tricuspid valve is normal in structure. Tricuspid valve regurgitation is trivial. No evidence of tricuspid stenosis. Aortic Valve: The aortic valve is normal in structure. Aortic valve regurgitation is not visualized. No aortic stenosis is present. Pulmonic Valve: The pulmonic valve was normal in structure. Pulmonic valve regurgitation is not visualized. No evidence of pulmonic stenosis. Aorta: The aortic root is normal in size and structure. Venous: The inferior vena cava is normal in size with greater than 50% respiratory variability, suggesting right atrial pressure of 3 mmHg. IAS/Shunts: No atrial level shunt detected by color flow Doppler.  LEFT VENTRICLE PLAX 2D LVIDd:         4.07 cm  Diastology LVIDs:         2.36 cm  LV e' lateral:   9.46 cm/s LV PW:          1.28 cm  LV E/e' lateral: 7.3 LV IVS:        1.01 cm  LV e' medial:    8.16 cm/s LVOT diam:     2.00 cm  LV E/e' medial:  8.4 LV SV:         55 LV SV Index:   28 LVOT Area:     3.14 cm  RIGHT VENTRICLE RV S prime:     18.60 cm/s TAPSE (M-mode): 1.4 cm LEFT ATRIUM             Index       RIGHT ATRIUM           Index LA diam:        3.20 cm 1.64 cm/m  RA Area:     10.60 cm LA Vol (A2C):   21.4 ml 10.96 ml/m RA Volume:   19.60 ml  10.04 ml/m LA Vol (A4C):   37.3 ml 19.11 ml/m LA Biplane Vol: 31.1 ml 15.93 ml/m  AORTIC VALVE LVOT Vmax:   117.00 cm/s LVOT Vmean:  67.500 cm/s LVOT VTI:    0.176 m  AORTA Ao Root diam: 2.60 cm MITRAL VALVE               TRICUSPID VALVE MV Area (PHT): 5.42 cm    TR Peak grad:   17.1 mmHg MV Decel Time: 140 msec    TR  Vmax:        207.00 cm/s MV E velocity: 68.90 cm/s MV A velocity: 82.00 cm/s  SHUNTS MV E/A ratio:  0.84        Systemic VTI:  0.18 m                            Systemic Diam: 2.00 cm Skeet Latch MD Electronically signed by Skeet Latch MD Signature Date/Time: 11/29/2019/6:13:07 PM    Final    VAS [place] LOWER EXTREMITY VENOUS (DVT)  Result Date: 11/29/2019 Summary: RIGHT: - There is no evidence of deep vein thrombosis in the lower extremity.  - No cystic structure found in the popliteal fossa.  LEFT: - Findings consistent with acute deep vein thrombosis involving the left gastrocnemius veins. - No cystic structure found in the popliteal fossa.  Electronically signed by Curt Jews MD on 11/29/2019 at 4:25:09 PM.    Final    US Abdomen Limited RUQ  Result Date: 11/29/2019 CLINICAL DATA:  Right upper quadrant pain 3 months. EXAM: ULTRASOUND ABDOMEN LIMITED RIGHT UPPER QUADRANT COMPARISON:  05/08/2007 FINDINGS: Gallbladder: Significant cholelithiasis with largest stone measuring 1.1 cm. Gallbladder wall thickening measuring 5.5 mm. Negative sonographic Murphy sign. No adjacent free fluid. Common bile duct: Diameter: 5.7 mm. Liver: Heterogeneous parenchymal  echogenicity with numerous liver masses many of which have a more hyperechoic periphery with more hypoechoic appearance centrally suggesting a somewhat target appearance. There is a large irregular shape cystic lesion with echogenic periphery over the right lobe measuring approximately 7.3 cm. Portal vein is patent on color Doppler imaging with normal direction of blood flow towards the liver. Other: None. IMPRESSION: 1. Moderate to severe cholelithiasis with mild gallbladder wall thickening of 5.5 mm. Recommend clinical correlation as findings could be seen with acute cholecystitis. 2. Numerous liver masses with the largest over the right lobe measuring 7.3 cm containing central cystic/necrotic area. Findings are highly suspicious for metastatic disease. Recommend clinical correlation as further evaluation with chest/abdominal/pelvic CT may be helpful to search for primary malignancy. Electronically Signed   By: Marin Olp M.D.   On: 11/29/2019 09:11     IMPRESSION:   *   Cecal mass with mets to mesenteric nodes, liver.  Presumed colon cancer.  *     Bilateral PE. LLE DVT  Day 2 IV heparin.  *     Cholelithiasis, mild GB wall thickening.  *   Microcytic anemia.  *    RUQ pain.  Surgery, Dr. Ninfa Linden has seen patient and feels there is possible history biliary colic but suspect pain is from the significant burden of masses in the liver.  They recommend colonoscopy, biopsy of mass.  *   HIV test positive. Further testing ordered.  I did not bring up these positive test result with the patient.   PLAN:     *    Colonoscopy with biopsy during heparin free window.  Aim for tomorrow.  Discussed risks of bleeding, perforation, respiratory distress with patient as well as the technicalities of the procedure and prep.  She is agreeable to proceed.  Time for questioning from patient as well as her daughter, all questions answered. Start clear diet now.  Split dose bowel prep begins  tonight.   Azucena Freed  11/30/2019, 8:23 AM Phone [phone]

## 2019-11-30 NOTE — Consult Note (Addendum)
[facility] Consult: 8:23 AM 11/30/2019  LOS: 1 day    Referring Provider: Dr Reesa Chew  Primary Care Physician:  Patient, No Pcp Per Primary Gastroenterologist:  unassigned     Reason for Consultation:  Metastatic colon cancer   HPI: Anita Hoffman is a 59 y.o. female. No sig past hx.  Previous tubal ligation.  Former smoker. No previous colonoscopy, EGD.  3 weeks of right upper abdominal pain, relieved with occasional Tylenol but persistent.  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  Weight baseline 210#baseline, 173 #currently.  She believes he is lost the weight over just a few weeks ago she denies anorexia, diminished p.o. intake, nausea.  However she did throw up nonbloody emesis once yesterday.  No constipation, no bloody or black stools.  Abdominal ultrasound: Severe cholelithiasis, mild GB wall thickening.  Numerous liver masses, largest in the right lobe is 7.3 cm with central cystic/necrosis. CT angio chest and CT abdomen pelvis: Multiple bilateral PE.  Evidence of right heart strain.  Large cecal mass can consistent with neoplasm.  Mets to liver and mesenteric nodes. DVT left gastrocnemius per Dopplers.   2D echo with LVEF 60 to 65%.  Grade 1 DD.  Normal pulmonary pressures.  No valvular abnormalities T bili 1.2, alkaline phosphatase 175.  AST/ALT 59/24.  Lipase 17.  Sodium 133.  No renal insufficiency.  Potassium 3.1 >> 3.8 >> 3.4. Hb 8.6 >> 7.4.  MCV 77.  Platelets, WBCs normal. 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  Social history: She was fired last year after 12 years at [employer].  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  Family history her father had lung cancer, he was a smoker.  Her maternal grandmother had ovarian  cancer.  No awareness of any colorectal or gastric cancers, ulcers, anemia, GI bleeding.    Past Medical History:  Diagnosis Date  . Former smoker   . Medical history non-contributory     Past Surgical History:  Procedure Laterality Date  . TUBAL LIGATION      Prior to Admission medications   Not on File    Scheduled Meds:  Infusions: . sodium chloride 75 mL/hr at 11/30/19 0418  . heparin 1,650 Units/hr (11/30/19 0419)   PRN Meds: acetaminophen **OR** acetaminophen, HYDROmorphone (DILAUDID) injection, ondansetron **OR** ondansetron (ZOFRAN) IV   Allergies as of 11/28/2019  . (No Known Allergies)    History reviewed. No pertinent family history.  Social History   Socioeconomic History  . Marital status: Single    Spouse name: Not on file  . Number of children: Not on file  . Years of education: Not on file  . Highest education level: Not on file  Occupational History  . Not on file  Tobacco Use  . Smoking status: Former Smoker    Packs/day: 0.50    Types: Cigarettes    Quit date: 11/29/2003    Years since quitting: 16.0  . Smokeless tobacco: Never Used  Substance and  Sexual Activity  . Alcohol use: Not Currently    Comment: 1990  . Drug use: Yes    Types: Marijuana    Comment: 1990  . Sexual activity: Not Currently  Other Topics Concern  . Not on file  Social History Narrative  . Not on file   Social Determinants of Health   Financial Resource Strain:   . Difficulty of Paying Living Expenses:   Food Insecurity:   . Worried About Charity fundraiser in the Last Year:   . Arboriculturist in the Last Year:   Transportation Needs:   . Film/video editor (Medical):   Marland Kitchen Lack of Transportation (Non-Medical):   Physical Activity:   . Days of Exercise per Week:   . Minutes of Exercise per Session:   Stress:   . Feeling of Stress :   Social Connections:   . Frequency of Communication with Friends and Family:   . Frequency of Social Gatherings  with Friends and Family:   . Attends Religious Services:   . Active Member of Clubs or Organizations:   . Attends Archivist Meetings:   Marland Kitchen Marital Status:   Intimate Partner Violence:   . Fear of Current or Ex-Partner:   . Emotionally Abused:   Marland Kitchen Physically Abused:   . Sexually Abused:     REVIEW OF SYSTEMS: Constitutional: Fatigue, weakness. ENT:  No nose bleeds Pulm: Dyspnea on exertion, not profound.  No cough. CV:  No palpitations, no LE edema.  No angina. GU:  No hematuria, no frequency GI: No dysphagia.  No heartburn.  See HPI for other details Heme: Bruises fairly easily but no unusual bleeding.  No history anemia. Transfusions: None. Neuro:  No headaches, no peripheral tingling or numbness.  Slight positional dizziness at times. Derm:  No itching, no rash or sores.  Endocrine:  No sweats or chills.  No polyuria or dysuria Immunization:   Not queried. Travel:  None beyond local counties in last few months.    PHYSICAL EXAM: Vital signs in last 24 hours: Vitals:   11/30/19 0521 11/30/19 0746  BP: 114/81 114/76  Pulse: 98   Resp:    Temp: 99.4 F (37.4 C) 99.6 F (37.6 C)  SpO2: 98%    Wt Readings from Last 3 Encounters:  11/30/19 78.6 kg    General: Pleasant, well-appearing, comfortable.  Appears stated age. Head: No facial asymmetry or swelling.  No signs of head trauma Eyes: No scleral icterus.  No conjunctival pallor.  EOMI. Ears: Not hard of hearing Nose: No congestion or discharge. Mouth: Moist, clear, pink oral mucosa.  Tongue midline.  Fair dentition. Neck: No JVD, no masses, no thyromegaly Lungs: Clear bilaterally.  No labored breathing, no cough. Heart: RRR.  No MRG.  S1, S2 present Abdomen: Soft.  Minor RUQ tenderness.  Bowel sounds active.  No HSM, masses, bruits, hernias..   Rectal: Deferred Musc/Skeltl: No joint redness, swelling or gross deformity. Extremities: No CCE. Neurologic: Alert.  Oriented x3.  Appropriate.  Moves all 4  limbs, no tremors or gross weakness though strength was not formally tested. Skin: No rash, no sores, no telangiectasia. Nodes: No cervical adenopathy Psych: Calm, cooperative, pleasant, fluid speech.  Intake/Output from previous day: 04/29 0701 - 04/30 0700 In: 1977 [I.V.:977; IV Piggyback:1000] Out: -  Intake/Output this shift: No intake/output data recorded.  LAB RESULTS: Recent Labs    11/28/19 1309 11/29/19 1400 11/30/19 0309  WBC 10.7* 11.0* 9.6  HGB 8.6*  8.4* 7.4*  HCT 28.4* 28.1* 23.8*  PLT 467* 464* 441*   BMET Lab Results  Component Value Date   NA 133 (L) 11/30/2019   NA 133 (L) 11/29/2019   NA 133 (L) 11/28/2019   K 3.4 (L) 11/30/2019   K 3.8 11/29/2019   K 3.1 (L) 11/28/2019   CL 100 11/30/2019   CL 97 (L) 11/29/2019   CL 94 (L) 11/28/2019   CO2 24 11/30/2019   CO2 21 (L) 11/29/2019   CO2 24 11/28/2019   GLUCOSE 104 (H) 11/30/2019   GLUCOSE 100 (H) 11/29/2019   GLUCOSE 129 (H) 11/28/2019   BUN 6 11/30/2019   BUN 7 11/29/2019   BUN 6 11/28/2019   CREATININE 0.61 11/30/2019   CREATININE 0.61 11/29/2019   CREATININE 0.77 11/28/2019   CALCIUM 7.6 (L) 11/30/2019   CALCIUM 8.1 (L) 11/29/2019   CALCIUM 8.4 (L) 11/28/2019   LFT Recent Labs    11/29/19 0732 11/29/19 1400 11/30/19 0309  PROT 7.6 7.2 6.3*  ALBUMIN 2.6* 2.5* 2.1*  AST 59* 56* 53*  ALT 24 21 19   ALKPHOS 175* 176* 162*  BILITOT 1.2 1.2 0.9  BILIDIR 0.2  --   --   IBILI 1.0*  --   --    PT/INR No results found for: INR, PROTIME Hepatitis Panel No results for input(s): HEPBSAG, HCVAB, HEPAIGM, HEPBIGM in the last 72 hours. C-Diff No components found for: CDIFF Lipase     Component Value Date/Time   LIPASE 17 11/29/2019 0732    Drugs of Abuse  No results found for: LABOPIA, COCAINSCRNUR, LABBENZ, AMPHETMU, THCU, LABBARB   RADIOLOGY STUDIES: DG Chest 2 View  Result Date: 11/28/2019 CLINICAL DATA:  59 year old female with history of chest and abdominal pain for the past 2  weeks. EXAM: CHEST - 2 VIEW COMPARISON:  No priors. FINDINGS: Lung volumes are normal. No consolidative airspace disease. No pleural effusions. No pneumothorax. No pulmonary nodule or mass noted. Pulmonary vasculature and the cardiomediastinal silhouette are within normal limits. IMPRESSION: No radiographic evidence of acute cardiopulmonary disease. Electronically Signed   By: Vinnie Langton M.D.   On: 11/28/2019 13:46   CT Angio Chest PE W/Cm &/Or Wo Cm  Result Date: 11/29/2019 CLINICAL DATA:  RIGHT upper quadrant and RIGHT lower chest pain, shortness of breath, lower extremity edema, former smoker EXAM: CT ANGIOGRAPHY CHEST CT ABDOMEN AND PELVIS WITH CONTRAST TECHNIQUE: Multidetector CT imaging of the chest was performed using the standard protocol during bolus administration of intravenous contrast. Multiplanar CT image reconstructions and MIPs were obtained to evaluate the vascular anatomy. Multidetector CT imaging of the abdomen and pelvis was performed using the standard protocol during bolus administration of intravenous contrast. CONTRAST:  187mL OMNIPAQUE IOHEXOL 300 MG/ML SOLN IV. No oral contrast. COMPARISON:  CT abdomen and pelvis 11/22/2006 FINDINGS: CTA CHEST FINDINGS Cardiovascular: Aorta normal caliber without aneurysm or dissection. Pulmonary arteries well opacified. Filling defects are seen within the pulmonary arteries bilaterally RIGHT greater than LEFT consistent with pulmonary emboli. These are greatest in the RIGHT lower lobe but affect all remaining lobes as well. RV/LV ratio = 1.0, elevated, consistent with RIGHT heart strain. Mediastinum/Nodes: Base of cervical region normal appearance. Normal sized axillary nodes bilaterally. No thoracic adenopathy. Esophagus unremarkable. Lungs/Pleura: Linear subsegmental atelectasis LEFT lower lobe. Lungs otherwise clear. No pulmonary infiltrate, pleural effusion, or pneumothorax. Musculoskeletal: No acute osseous findings. Review of the MIP images  confirms the above findings. CT ABDOMEN and PELVIS FINDINGS Hepatobiliary: Multiple poorly defined mass  lesions are seen throughout the liver consistent with extensive hepatic metastatic disease. Largest of these measure 6.6 x 4.2 cm lateral segment LEFT lobe image 43, 5.7 x 5.7 cm posterior RIGHT lobe image 34, anterior RIGHT lobe 7.2 x 5.0 cm image 33, and 7.6 x 7.1 cm anterior RIGHT lobe image 22. Gallbladder unremarkable. Pancreas: Normal appearance Spleen: Normal appearance Adrenals/Urinary Tract: Adrenal glands normal appearance. BILATERAL renal cysts. Kidneys, ureters, and bladder otherwise normal appearance. Stomach/Bowel: Normal appendix. Large mass identified at cecum extending into ileocecal valve region and terminal ileum measuring 5.6 x 4.0 x 4.5 cm consistent with colonic neoplasm. Thickening of terminal ileum without obstruction. Stomach decompressed. Remaining large and small bowel loops unremarkable. Vascular/Lymphatic: Minimal atherosclerotic calcification aorta. Aorta normal caliber. Few pelvic phleboliths. Mesenteric mass with minimal calcification 4.1 x 2.8 cm image 54 likely nodal metastasis. Additional enlarged pathologic node mesentery medial to cecal tip 1.7 x 1.5 cm. Normal sized inguinal lymph nodes. Reproductive: Uterus and adnexa unremarkable Other: Tiny umbilical hernia containing fat. No free air or free fluid. Musculoskeletal: Bones demineralized. Lipoma involving the distal RIGHT iliopsoas muscle/tendon, 3.6 x 2.5 cm x 8.9 cm length. Review of the MIP images confirms the above findings. IMPRESSION: Multiple BILATERAL pulmonary emboli RIGHT greater than LEFT. Positive for acute PE with CT evidence of right heart strain (RV/LV Ratio = 1.0) consistent with at least submassive (intermediate risk) PE; the presence of right heart strain has been associated with an increased risk of morbidity and mortality. Large cecal mass extending into ileocecal valve region and terminal ileum measuring  5.6 x 4.0 x 4.5 cm consistent with colonic neoplasm. Multiple hepatic metastases. Mesenteric nodal metastases. Aortic Atherosclerosis (ICD10-I70.0). Critical Value/emergent results were called by telephone at the time of interpretation on 11/29/2019 at 12:24 pm to provider DR. ELIZABETH REES , who verbally acknowledged these results. Electronically Signed   By: Lavonia Dana M.D.   On: 11/29/2019 12:27   CT Abdomen Pelvis W Contrast  Result Date: 11/29/2019 CLINICAL DATA:  RIGHT upper quadrant and RIGHT lower chest pain, shortness of breath, lower extremity edema, former smoker EXAM: CT ANGIOGRAPHY CHEST CT ABDOMEN AND PELVIS WITH CONTRAST TECHNIQUE: Multidetector CT imaging of the chest was performed using the standard protocol during bolus administration of intravenous contrast. Multiplanar CT image reconstructions and MIPs were obtained to evaluate the vascular anatomy. Multidetector CT imaging of the abdomen and pelvis was performed using the standard protocol during bolus administration of intravenous contrast. CONTRAST:  161mL OMNIPAQUE IOHEXOL 300 MG/ML SOLN IV. No oral contrast. COMPARISON:  CT abdomen and pelvis 11/22/2006 FINDINGS: CTA CHEST FINDINGS Cardiovascular: Aorta normal caliber without aneurysm or dissection. Pulmonary arteries well opacified. Filling defects are seen within the pulmonary arteries bilaterally RIGHT greater than LEFT consistent with pulmonary emboli. These are greatest in the RIGHT lower lobe but affect all remaining lobes as well. RV/LV ratio = 1.0, elevated, consistent with RIGHT heart strain. Mediastinum/Nodes: Base of cervical region normal appearance. Normal sized axillary nodes bilaterally. No thoracic adenopathy. Esophagus unremarkable. Lungs/Pleura: Linear subsegmental atelectasis LEFT lower lobe. Lungs otherwise clear. No pulmonary infiltrate, pleural effusion, or pneumothorax. Musculoskeletal: No acute osseous findings. Review of the MIP images confirms the above  findings. CT ABDOMEN and PELVIS FINDINGS Hepatobiliary: Multiple poorly defined mass lesions are seen throughout the liver consistent with extensive hepatic metastatic disease. Largest of these measure 6.6 x 4.2 cm lateral segment LEFT lobe image 43, 5.7 x 5.7 cm posterior RIGHT lobe image 34, anterior RIGHT lobe 7.2 x 5.0 cm  image 33, and 7.6 x 7.1 cm anterior RIGHT lobe image 22. Gallbladder unremarkable. Pancreas: Normal appearance Spleen: Normal appearance Adrenals/Urinary Tract: Adrenal glands normal appearance. BILATERAL renal cysts. Kidneys, ureters, and bladder otherwise normal appearance. Stomach/Bowel: Normal appendix. Large mass identified at cecum extending into ileocecal valve region and terminal ileum measuring 5.6 x 4.0 x 4.5 cm consistent with colonic neoplasm. Thickening of terminal ileum without obstruction. Stomach decompressed. Remaining large and small bowel loops unremarkable. Vascular/Lymphatic: Minimal atherosclerotic calcification aorta. Aorta normal caliber. Few pelvic phleboliths. Mesenteric mass with minimal calcification 4.1 x 2.8 cm image 54 likely nodal metastasis. Additional enlarged pathologic node mesentery medial to cecal tip 1.7 x 1.5 cm. Normal sized inguinal lymph nodes. Reproductive: Uterus and adnexa unremarkable Other: Tiny umbilical hernia containing fat. No free air or free fluid. Musculoskeletal: Bones demineralized. Lipoma involving the distal RIGHT iliopsoas muscle/tendon, 3.6 x 2.5 cm x 8.9 cm length. Review of the MIP images confirms the above findings. IMPRESSION: Multiple BILATERAL pulmonary emboli RIGHT greater than LEFT. Positive for acute PE with CT evidence of right heart strain (RV/LV Ratio = 1.0) consistent with at least submassive (intermediate risk) PE; the presence of right heart strain has been associated with an increased risk of morbidity and mortality. Large cecal mass extending into ileocecal valve region and terminal ileum measuring 5.6 x 4.0 x 4.5 cm  consistent with colonic neoplasm. Multiple hepatic metastases. Mesenteric nodal metastases. Aortic Atherosclerosis (ICD10-I70.0). Critical Value/emergent results were called by telephone at the time of interpretation on 11/29/2019 at 12:24 pm to provider DR. ELIZABETH REES , who verbally acknowledged these results. Electronically Signed   By: Lavonia Dana M.D.   On: 11/29/2019 12:27   ECHOCARDIOGRAM COMPLETE  Result Date: 11/29/2019    ECHOCARDIOGRAM REPORT   Patient Name:   Kristie Cowman Date of Exam: 11/29/2019 Medical Rec #:  [id number]         Height:       62.0 in Accession #:    [id number]        Weight:       210.0 lb Date of Birth:  1961-05-27         BSA:          1.952 m Patient Age:    75 years          BP:           133/77 mmHg Patient Gender: F                 HR:           97 bpm. Exam Location:  Inpatient Procedure: 2D Echo Indications:    Pulmonary Embolus I26.99  History:        Patient has no prior history of Echocardiogram examinations.                 Risk Factors:Former Smoker.  Sonographer:    Mikki Santee RDCS (AE) Referring Phys: [id number] Michell Heinrich PAHWANI IMPRESSIONS  1. Left ventricular ejection fraction, by estimation, is 60 to 65%. The left ventricle has normal function. The left ventricle has no regional wall motion abnormalities. There is mild concentric left ventricular hypertrophy. Left ventricular diastolic parameters are consistent with Grade I diastolic dysfunction (impaired relaxation).  2. Right ventricular systolic function is normal. The right ventricular size is normal. There is normal pulmonary artery systolic pressure.  3. The mitral valve is normal in structure. No evidence of mitral valve regurgitation. No evidence of mitral stenosis.  4.  The aortic valve is normal in structure. Aortic valve regurgitation is not visualized. No aortic stenosis is present.  5. The inferior vena cava is normal in size with greater than 50% respiratory variability, suggesting right  atrial pressure of 3 mmHg. FINDINGS  Left Ventricle: Left ventricular ejection fraction, by estimation, is 60 to 65%. The left ventricle has normal function. The left ventricle has no regional wall motion abnormalities. The left ventricular internal cavity size was normal in size. There is  mild concentric left ventricular hypertrophy. Left ventricular diastolic parameters are consistent with Grade I diastolic dysfunction (impaired relaxation). Right Ventricle: The right ventricular size is normal. No increase in right ventricular wall thickness. Right ventricular systolic function is normal. There is normal pulmonary artery systolic pressure. The tricuspid regurgitant velocity is 2.07 m/s, and  with an assumed right atrial pressure of 3 mmHg, the estimated right ventricular systolic pressure is [id number]. Left Atrium: Left atrial size was normal in size. Right Atrium: Right atrial size was normal in size. Pericardium: Trivial pericardial effusion is present. Mitral Valve: The mitral valve is normal in structure. Normal mobility of the mitral valve leaflets. No evidence of mitral valve regurgitation. No evidence of mitral valve stenosis. Tricuspid Valve: The tricuspid valve is normal in structure. Tricuspid valve regurgitation is trivial. No evidence of tricuspid stenosis. Aortic Valve: The aortic valve is normal in structure. Aortic valve regurgitation is not visualized. No aortic stenosis is present. Pulmonic Valve: The pulmonic valve was normal in structure. Pulmonic valve regurgitation is not visualized. No evidence of pulmonic stenosis. Aorta: The aortic root is normal in size and structure. Venous: The inferior vena cava is normal in size with greater than 50% respiratory variability, suggesting right atrial pressure of 3 mmHg. IAS/Shunts: No atrial level shunt detected by color flow Doppler.  LEFT VENTRICLE PLAX 2D LVIDd:         4.07 cm  Diastology LVIDs:         2.36 cm  LV e' lateral:   9.46 cm/s LV PW:          1.28 cm  LV E/e' lateral: 7.3 LV IVS:        1.01 cm  LV e' medial:    8.16 cm/s LVOT diam:     2.00 cm  LV E/e' medial:  8.4 LV SV:         55 LV SV Index:   28 LVOT Area:     3.14 cm  RIGHT VENTRICLE RV S prime:     18.60 cm/s TAPSE (M-mode): 1.4 cm LEFT ATRIUM             Index       RIGHT ATRIUM           Index LA diam:        3.20 cm 1.64 cm/m  RA Area:     10.60 cm LA Vol (A2C):   21.4 ml 10.96 ml/m RA Volume:   19.60 ml  10.04 ml/m LA Vol (A4C):   37.3 ml 19.11 ml/m LA Biplane Vol: 31.1 ml 15.93 ml/m  AORTIC VALVE LVOT Vmax:   117.00 cm/s LVOT Vmean:  67.500 cm/s LVOT VTI:    0.176 m  AORTA Ao Root diam: 2.60 cm MITRAL VALVE               TRICUSPID VALVE MV Area (PHT): 5.42 cm    TR Peak grad:   17.1 mmHg MV Decel Time: 140 msec    TR  Vmax:        207.00 cm/s MV E velocity: 68.90 cm/s MV A velocity: 82.00 cm/s  SHUNTS MV E/A ratio:  0.84        Systemic VTI:  0.18 m                            Systemic Diam: 2.00 cm Skeet Latch MD Electronically signed by Skeet Latch MD Signature Date/Time: 11/29/2019/6:13:07 PM    Final    VAS [place] LOWER EXTREMITY VENOUS (DVT)  Result Date: 11/29/2019 Summary: RIGHT: - There is no evidence of deep vein thrombosis in the lower extremity.  - No cystic structure found in the popliteal fossa.  LEFT: - Findings consistent with acute deep vein thrombosis involving the left gastrocnemius veins. - No cystic structure found in the popliteal fossa.  Electronically signed by Curt Jews MD on 11/29/2019 at 4:25:09 PM.    Final    US Abdomen Limited RUQ  Result Date: 11/29/2019 CLINICAL DATA:  Right upper quadrant pain 3 months. EXAM: ULTRASOUND ABDOMEN LIMITED RIGHT UPPER QUADRANT COMPARISON:  05/08/2007 FINDINGS: Gallbladder: Significant cholelithiasis with largest stone measuring 1.1 cm. Gallbladder wall thickening measuring 5.5 mm. Negative sonographic Murphy sign. No adjacent free fluid. Common bile duct: Diameter: 5.7 mm. Liver: Heterogeneous parenchymal  echogenicity with numerous liver masses many of which have a more hyperechoic periphery with more hypoechoic appearance centrally suggesting a somewhat target appearance. There is a large irregular shape cystic lesion with echogenic periphery over the right lobe measuring approximately 7.3 cm. Portal vein is patent on color Doppler imaging with normal direction of blood flow towards the liver. Other: None. IMPRESSION: 1. Moderate to severe cholelithiasis with mild gallbladder wall thickening of 5.5 mm. Recommend clinical correlation as findings could be seen with acute cholecystitis. 2. Numerous liver masses with the largest over the right lobe measuring 7.3 cm containing central cystic/necrotic area. Findings are highly suspicious for metastatic disease. Recommend clinical correlation as further evaluation with chest/abdominal/pelvic CT may be helpful to search for primary malignancy. Electronically Signed   By: Marin Olp M.D.   On: 11/29/2019 09:11     IMPRESSION:   *   Cecal mass with mets to mesenteric nodes, liver.  Presumed colon cancer.  *     Bilateral PE. LLE DVT  Day 2 IV heparin.  *     Cholelithiasis, mild GB wall thickening.  *   Microcytic anemia.  *    RUQ pain.  Surgery, Dr. Ninfa Linden has seen patient and feels there is possible history biliary colic but suspect pain is from the significant burden of masses in the liver.  They recommend colonoscopy, biopsy of mass.  *   HIV test positive. Further testing ordered.  I did not bring up these positive test result with the patient.   PLAN:     *    Colonoscopy with biopsy during heparin free window.  Aim for tomorrow.  Discussed risks of bleeding, perforation, respiratory distress with patient as well as the technicalities of the procedure and prep.  She is agreeable to proceed.  Time for questioning from patient as well as her daughter, all questions answered. Start clear diet now.  Split dose bowel prep begins  tonight.   Azucena Freed  11/30/2019, 8:23 AM Phone [phone]

## 2019-11-30 NOTE — Progress Notes (Signed)
ANTICOAGULATION CONSULT NOTE - Follow Up Consult  Pharmacy Consult for heparin Indication: pulmonary embolus  No Known Allergies  Patient Measurements: Height: 5\' 2"  (157.5 cm) Weight: 96.1 kg (211 lb 13.8 oz) IBW/kg (Calculated) : 50.1 Heparin Dosing Weight: 72.4 kg  Vital Signs: Temp: 99.8 F (37.7 C) (04/30 0010) Temp Source: Oral (04/30 0010) BP: 118/75 (04/30 0010) Pulse Rate: 94 (04/30 0010)  Labs: Recent Labs    11/28/19 1309 11/28/19 1309 11/29/19 0757 11/29/19 1400 11/29/19 2009 11/30/19 0309  HGB 8.6*   < >  --  8.4*  --  7.4*  HCT 28.4*  --   --  28.1*  --  23.8*  PLT 467*  --   --  464*  --  441*  HEPARINUNFRC  --   --   --   --  0.16* 0.24*  CREATININE 0.77  --   --  0.61  --   --   TROPONINIHS 13  --  25*  --   --   --    < > = values in this interval not displayed.    Estimated Creatinine Clearance: 83.9 mL/min (by C-G formula based on SCr of 0.61 mg/dL).   Assessment: 59 yr old female presenting to the ED with CP and abdominal pain was found to have multiple bilateral PE's with right heart strain. 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 Pharmacy was consulted to start IV heparin. Pt was not on anticoagulation PTA.  Heparin level subtherapeutic (0.24) on gtt at 1450 units/hr. No issues with line or bleeding reported per RN.  Goal of Therapy:  Heparin level 0.3-0.7 units/ml Monitor platelets by anticoagulation protocol: Yes   Plan:  Rebolus heparin 1000 units Increase heparin infusion to 1650 units/hr Check 6-hr heparin level  Sherlon Handing, PharmD, BCPS Please see amion for complete clinical pharmacist phone list 11/30/2019,4:04 AM

## 2019-11-30 NOTE — Anesthesia Preprocedure Evaluation (Addendum)
Anesthesia Evaluation  Patient identified by MRN, date of birth, ID band Patient awake    Reviewed: Allergy & Precautions, NPO status , Patient's Chart, lab work & pertinent test results  Airway Mallampati: II  TM Distance: >3 FB     Dental   Pulmonary former smoker,    breath sounds clear to auscultation       Cardiovascular negative cardio ROS   Rhythm:Regular Rate:Normal     Neuro/Psych    GI/Hepatic Neg liver ROS, History  noted CG   Endo/Other  negative endocrine ROS  Renal/GU negative Renal ROS     Musculoskeletal   Abdominal   Peds  Hematology   Anesthesia Other Findings   Reproductive/Obstetrics                           Anesthesia Physical Anesthesia Plan  ASA: III  Anesthesia Plan: MAC   Post-op Pain Management:    Induction: Intravenous  PONV Risk Score and Plan: 2 and Ondansetron, Dexamethasone and Midazolam  Airway Management Planned: Nasal Cannula and Simple Face Mask  Additional Equipment:   Intra-op Plan:   Post-operative Plan:   Informed Consent: I have reviewed the patients History and Physical, chart, labs and discussed the procedure including the risks, benefits and alternatives for the proposed anesthesia with the patient or authorized representative who has indicated his/her understanding and acceptance.     Dental advisory given  Plan Discussed with: Anesthesiologist and CRNA  Anesthesia Plan Comments:         Anesthesia Quick Evaluation

## 2019-11-30 NOTE — Progress Notes (Signed)
PROGRESS NOTE    Anita Hoffman  [id number] DOB: 04/14/1961 DOA: 11/28/2019 PCP: Patient, No Pcp Per   Brief Narrative:  59 year old with history of tobacco use presented to the hospital with worsening abdominal pain and shortness of breath for 3 weeks.  Also reported of weight loss.  Right upper quadrant ultrasound showed moderate to severe cholelithiasis with gallbladder wall thickening, numerous liver masses.  CT angio showed bilateral PE.  CT abdomen showed large cecal mass.  GI and general surgery were consulted.   Assessment & Plan:   Principal Problem:   Bilateral pulmonary embolism (HCC) Active Problems:   Cecal neoplasm   Liver metastases (HCC)   Elevated troponin  Acute bilateral pulmonary embolism with cor pulmonale Left lower extremity DVT -Currently on heparin drip.  Suspect secondary to underlying malignancy -Lower extremity Dopplers-left lower extremity DVT. 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  GI consulted for colonoscopy.  Tissue diagnosis will help -CEA  Microcytic anemia, hemoglobin 8.4 -Suspect occult blood loss.  Low iron saturation and ferritin.  IV Feraheme today, p.o. supplements tomorrow.  Bowel regimen. -B12 and TSH within normal limit, folate pending  HIV, reactive -We will order quantitative analysis.  Discussed this with the patient.  Cholelithiasis with concerns for cholecystitis, on imaging -General surgery consulted-do not suspect acute cholecystitis at this time therefore no further work-up/surgical intervention necessary.  Given her advance diagnosis, I offered her palliative care services as well.  DVT prophylaxis: Heparin drip Code Status: Full code Family Communication: Daughter at  bedside but I spoke with patient privately regarding her ongoing diagnosis about malignancy, pulmonary embolism and current known information about her HIV status  Status is: Inpatient  Remains inpatient appropriate because:Ongoing diagnostic testing needed not appropriate for outpatient work up   Dispo: The patient is from: Home              Anticipated d/c is to: Home              Anticipated d/c date is: 2 days              Patient currently is not medically stable to d/c.  Undergoing evaluation for pulmonary embolism complicated by new diagnosis of metastatic cecal mass And microcytic anemia.  Subjective: Feels okay no complaints.  Worried about her diagnosis as expected.  Review of Systems Otherwise negative except as per HPI, including: General: Denies fever, chills, night sweats or unintended weight loss. Resp: Denies cough, wheezing, shortness of breath. Cardiac: Denies chest pain, palpitations, orthopnea, paroxysmal nocturnal dyspnea. GI: Denies abdominal pain, nausea, vomiting, diarrhea or constipation GU: Denies dysuria, frequency, hesitancy or incontinence MS: Denies muscle aches, joint pain or swelling Neuro: Denies headache, neurologic deficits (focal weakness, numbness, tingling), abnormal gait Psych: Denies anxiety, depression, SI/HI/AVH Skin: Denies new rashes or lesions ID: Denies sick contacts, exotic exposures, travel  Examination:  General exam: Appears calm and comfortable  Respiratory system: Clear to auscultation. Respiratory effort normal. Cardiovascular system: S1 & S2 heard, RRR. No JVD, murmurs, rubs, gallops or clicks. No pedal edema. Gastrointestinal system: Abdomen is nondistended, soft and nontender. No organomegaly or masses felt. Normal bowel sounds heard. Central nervous system: Alert and oriented. No focal neurological deficits. Extremities: Symmetric 5 x 5 power. Skin: No rashes, lesions or ulcers Psychiatry: Judgement and insight appear  normal.  Mood & affect appropriate.     Objective: Vitals:   11/29/19 1947 11/30/19 0010 11/30/19 0521 11/30/19 0746  BP: 113/71 118/75 114/81 114/76  Pulse: 87 94 98   Resp:      Temp: 98.9 F (37.2 C) 99.8 F (37.7 C) 99.4 F (37.4 C) 99.6 F (37.6 C)  TempSrc: Oral Oral Oral Oral  SpO2: 100% 99% 98%   Weight:   78.6 kg   Height:        Intake/Output Summary (Last 24 hours) at 11/30/2019 0823 Last data filed at 11/30/2019 0300 Gross per 24 hour  Intake 1977 ml  Output --  Net 1977 ml   Filed Weights   11/28/19 1230 11/29/19 1607 11/30/19 0521  Weight: 95.3 kg 96.1 kg 78.6 kg     Data Reviewed:   CBC: Recent Labs  Lab 11/28/19 1309 11/29/19 1400 11/30/19 0309  WBC 10.7* 11.0* 9.6  NEUTROABS  --  7.5  --   HGB 8.6* 8.4* 7.4*  HCT 28.4* 28.1* 23.8*  MCV 78.5* 80.7 77.8*  PLT 467* 464* [id number]*   Basic Metabolic Panel: Recent Labs  Lab 11/28/19 1309 11/29/19 1400 11/30/19 0309  NA 133* 133* 133*  K 3.1* 3.8 3.4*  CL 94* 97* 100  CO2 24 21* 24  GLUCOSE 129* 100* 104*  BUN 6 7 6   CREATININE 0.77 0.61 0.61  CALCIUM 8.4* 8.1* 7.6*   GFR: Estimated Creatinine Clearance: 75.3 mL/min (by C-G formula based on SCr of 0.61 mg/dL). Liver Function Tests: Recent Labs  Lab 11/29/19 0732 11/29/19 1400 11/30/19 0309  AST 59* 56* 53*  ALT 24 21 19   ALKPHOS 175* 176* 162*  BILITOT 1.2 1.2 0.9  PROT 7.6 7.2 6.3*  ALBUMIN 2.6* 2.5* 2.1*   Recent Labs  Lab 11/29/19 0732  LIPASE 17   No results for input(s): AMMONIA in the last 168 hours. Coagulation Profile: No results for input(s): INR, PROTIME in the last 168 hours. Cardiac Enzymes: No results for input(s): CKTOTAL, CKMB, CKMBINDEX, TROPONINI in the last 168 hours. BNP (last 3 results) No results for input(s): PROBNP in the last 8760 hours. HbA1C: No results for input(s): HGBA1C in the last 72 hours. CBG: No results for input(s): GLUCAP in the last 168 hours. Lipid Profile: No results for input(s):  CHOL, HDL, LDLCALC, TRIG, CHOLHDL, LDLDIRECT in the last 72 hours. Thyroid Function Tests: No results for input(s): TSH, T4TOTAL, FREET4, T3FREE, THYROIDAB in the last 72 hours. Anemia Panel: No results for input(s): VITAMINB12, FOLATE, FERRITIN, TIBC, IRON, RETICCTPCT in the last 72 hours. Sepsis Labs: No results for input(s): PROCALCITON, LATICACIDVEN in the last 168 hours.  Recent Results (from the past 240 hour(s))  Respiratory Panel by RT PCR (Flu A&B, Covid) - Nasopharyngeal Swab     Status: None   Collection Time: 11/29/19 10:28 AM   Specimen: Nasopharyngeal Swab  Result Value Ref Range Status   SARS Coronavirus 2 by RT PCR NEGATIVE NEGATIVE Final    Comment: (NOTE) SARS-CoV-2 target nucleic acids are NOT DETECTED. The SARS-CoV-2 RNA is generally detectable in upper respiratoy specimens during the acute phase of infection. The lowest concentration of SARS-CoV-2 viral copies this assay can detect is 131 copies/mL. A negative result does not preclude SARS-Cov-2 infection and should not be used as the sole basis for treatment or other patient management decisions. A negative result may occur with  improper specimen collection/handling, submission of specimen other than nasopharyngeal swab, presence of viral mutation(s) within the areas targeted by  this assay, and inadequate number of viral copies (<131 copies/mL). A negative result must be combined with clinical observations, patient history, and epidemiological information. The expected result is Negative. Fact Sheet for Patients:  PinkCheek.be Fact Sheet for Healthcare Providers:  GravelBags.it This test is not yet ap proved or cleared by the [place] FDA and  has been authorized for detection and/or diagnosis of SARS-CoV-2 by FDA under an Emergency Use Authorization (EUA). This EUA will remain  in effect (meaning this test can be used) for the duration of  the COVID-19 declaration under Section 564(b)(1) of the Act, 21 U.S.C. section 360bbb-3(b)(1), unless the authorization is terminated or revoked sooner.    Influenza A by PCR NEGATIVE NEGATIVE Final   Influenza B by PCR NEGATIVE NEGATIVE Final    Comment: (NOTE) The Xpert Xpress SARS-CoV-2/FLU/RSV assay is intended as an aid in  the diagnosis of influenza from Nasopharyngeal swab specimens and  should not be used as a sole basis for treatment. Nasal washings and  aspirates are unacceptable for Xpert Xpress SARS-CoV-2/FLU/RSV  testing. Fact Sheet for Patients: PinkCheek.be Fact Sheet for Healthcare Providers: GravelBags.it This test is not yet approved or cleared by the [place] FDA and  has been authorized for detection and/or diagnosis of SARS-CoV-2 by  FDA under an Emergency Use Authorization (EUA). This EUA will remain  in effect (meaning this test can be used) for the duration of the  Covid-19 declaration under Section 564(b)(1) of the Act, 21  U.S.C. section 360bbb-3(b)(1), unless the authorization is  terminated or revoked. Performed at [facility], [place] [address] [place], [place] [address]          Radiology Studies: DG Chest 2 View  Result Date: 11/28/2019 CLINICAL DATA:  59 year old female with history of chest and abdominal pain for the past 2 weeks. EXAM: CHEST - 2 VIEW COMPARISON:  No priors. FINDINGS: Lung volumes are normal. No consolidative airspace disease. No pleural effusions. No pneumothorax. No pulmonary nodule or mass noted. Pulmonary vasculature and the cardiomediastinal silhouette are within normal limits. IMPRESSION: No radiographic evidence of acute cardiopulmonary disease. Electronically Signed   By: Vinnie Langton M.D.   On: 11/28/2019 13:46   CT Angio Chest PE W/Cm &/Or Wo Cm  Result Date: 11/29/2019 CLINICAL DATA:  RIGHT upper quadrant and RIGHT lower chest pain, shortness of  breath, lower extremity edema, former smoker EXAM: CT ANGIOGRAPHY CHEST CT ABDOMEN AND PELVIS WITH CONTRAST TECHNIQUE: Multidetector CT imaging of the chest was performed using the standard protocol during bolus administration of intravenous contrast. Multiplanar CT image reconstructions and MIPs were obtained to evaluate the vascular anatomy. Multidetector CT imaging of the abdomen and pelvis was performed using the standard protocol during bolus administration of intravenous contrast. CONTRAST:  179mL OMNIPAQUE IOHEXOL 300 MG/ML SOLN IV. No oral contrast. COMPARISON:  CT abdomen and pelvis 11/22/2006 FINDINGS: CTA CHEST FINDINGS Cardiovascular: Aorta normal caliber without aneurysm or dissection. Pulmonary arteries well opacified. Filling defects are seen within the pulmonary arteries bilaterally RIGHT greater than LEFT consistent with pulmonary emboli. These are greatest in the RIGHT lower lobe but affect all remaining lobes as well. RV/LV ratio = 1.0, elevated, consistent with RIGHT heart strain. Mediastinum/Nodes: Base of cervical region normal appearance. Normal sized axillary nodes bilaterally. No thoracic adenopathy. Esophagus unremarkable. Lungs/Pleura: Linear subsegmental atelectasis LEFT lower lobe. Lungs otherwise clear. No pulmonary infiltrate, pleural effusion, or pneumothorax. Musculoskeletal: No acute osseous findings. Review of the MIP images confirms the above findings. CT ABDOMEN  and PELVIS FINDINGS Hepatobiliary: Multiple poorly defined mass lesions are seen throughout the liver consistent with extensive hepatic metastatic disease. Largest of these measure 6.6 x 4.2 cm lateral segment LEFT lobe image 43, 5.7 x 5.7 cm posterior RIGHT lobe image 34, anterior RIGHT lobe 7.2 x 5.0 cm image 33, and 7.6 x 7.1 cm anterior RIGHT lobe image 22. Gallbladder unremarkable. Pancreas: Normal appearance Spleen: Normal appearance Adrenals/Urinary Tract: Adrenal glands normal appearance. BILATERAL renal cysts.  Kidneys, ureters, and bladder otherwise normal appearance. Stomach/Bowel: Normal appendix. Large mass identified at cecum extending into ileocecal valve region and terminal ileum measuring 5.6 x 4.0 x 4.5 cm consistent with colonic neoplasm. Thickening of terminal ileum without obstruction. Stomach decompressed. Remaining large and small bowel loops unremarkable. Vascular/Lymphatic: Minimal atherosclerotic calcification aorta. Aorta normal caliber. Few pelvic phleboliths. Mesenteric mass with minimal calcification 4.1 x 2.8 cm image 54 likely nodal metastasis. Additional enlarged pathologic node mesentery medial to cecal tip 1.7 x 1.5 cm. Normal sized inguinal lymph nodes. Reproductive: Uterus and adnexa unremarkable Other: Tiny umbilical hernia containing fat. No free air or free fluid. Musculoskeletal: Bones demineralized. Lipoma involving the distal RIGHT iliopsoas muscle/tendon, 3.6 x 2.5 cm x 8.9 cm length. Review of the MIP images confirms the above findings. IMPRESSION: Multiple BILATERAL pulmonary emboli RIGHT greater than LEFT. Positive for acute PE with CT evidence of right heart strain (RV/LV Ratio = 1.0) consistent with at least submassive (intermediate risk) PE; the presence of right heart strain has been associated with an increased risk of morbidity and mortality. Large cecal mass extending into ileocecal valve region and terminal ileum measuring 5.6 x 4.0 x 4.5 cm consistent with colonic neoplasm. Multiple hepatic metastases. Mesenteric nodal metastases. Aortic Atherosclerosis (ICD10-I70.0). Critical Value/emergent results were called by telephone at the time of interpretation on 11/29/2019 at 12:24 pm to provider DR. ELIZABETH REES , who verbally acknowledged these results. Electronically Signed   By: Lavonia Dana M.D.   On: 11/29/2019 12:27   CT Abdomen Pelvis W Contrast  Result Date: 11/29/2019 CLINICAL DATA:  RIGHT upper quadrant and RIGHT lower chest pain, shortness of breath, lower extremity  edema, former smoker EXAM: CT ANGIOGRAPHY CHEST CT ABDOMEN AND PELVIS WITH CONTRAST TECHNIQUE: Multidetector CT imaging of the chest was performed using the standard protocol during bolus administration of intravenous contrast. Multiplanar CT image reconstructions and MIPs were obtained to evaluate the vascular anatomy. Multidetector CT imaging of the abdomen and pelvis was performed using the standard protocol during bolus administration of intravenous contrast. CONTRAST:  148mL OMNIPAQUE IOHEXOL 300 MG/ML SOLN IV. No oral contrast. COMPARISON:  CT abdomen and pelvis 11/22/2006 FINDINGS: CTA CHEST FINDINGS Cardiovascular: Aorta normal caliber without aneurysm or dissection. Pulmonary arteries well opacified. Filling defects are seen within the pulmonary arteries bilaterally RIGHT greater than LEFT consistent with pulmonary emboli. These are greatest in the RIGHT lower lobe but affect all remaining lobes as well. RV/LV ratio = 1.0, elevated, consistent with RIGHT heart strain. Mediastinum/Nodes: Base of cervical region normal appearance. Normal sized axillary nodes bilaterally. No thoracic adenopathy. Esophagus unremarkable. Lungs/Pleura: Linear subsegmental atelectasis LEFT lower lobe. Lungs otherwise clear. No pulmonary infiltrate, pleural effusion, or pneumothorax. Musculoskeletal: No acute osseous findings. Review of the MIP images confirms the above findings. CT ABDOMEN and PELVIS FINDINGS Hepatobiliary: Multiple poorly defined mass lesions are seen throughout the liver consistent with extensive hepatic metastatic disease. Largest of these measure 6.6 x 4.2 cm lateral segment LEFT lobe image 43, 5.7 x 5.7 cm posterior RIGHT lobe image  34, anterior RIGHT lobe 7.2 x 5.0 cm image 33, and 7.6 x 7.1 cm anterior RIGHT lobe image 22. Gallbladder unremarkable. Pancreas: Normal appearance Spleen: Normal appearance Adrenals/Urinary Tract: Adrenal glands normal appearance. BILATERAL renal cysts. Kidneys, ureters, and  bladder otherwise normal appearance. Stomach/Bowel: Normal appendix. Large mass identified at cecum extending into ileocecal valve region and terminal ileum measuring 5.6 x 4.0 x 4.5 cm consistent with colonic neoplasm. Thickening of terminal ileum without obstruction. Stomach decompressed. Remaining large and small bowel loops unremarkable. Vascular/Lymphatic: Minimal atherosclerotic calcification aorta. Aorta normal caliber. Few pelvic phleboliths. Mesenteric mass with minimal calcification 4.1 x 2.8 cm image 54 likely nodal metastasis. Additional enlarged pathologic node mesentery medial to cecal tip 1.7 x 1.5 cm. Normal sized inguinal lymph nodes. Reproductive: Uterus and adnexa unremarkable Other: Tiny umbilical hernia containing fat. No free air or free fluid. Musculoskeletal: Bones demineralized. Lipoma involving the distal RIGHT iliopsoas muscle/tendon, 3.6 x 2.5 cm x 8.9 cm length. Review of the MIP images confirms the above findings. IMPRESSION: Multiple BILATERAL pulmonary emboli RIGHT greater than LEFT. Positive for acute PE with CT evidence of right heart strain (RV/LV Ratio = 1.0) consistent with at least submassive (intermediate risk) PE; the presence of right heart strain has been associated with an increased risk of morbidity and mortality. Large cecal mass extending into ileocecal valve region and terminal ileum measuring 5.6 x 4.0 x 4.5 cm consistent with colonic neoplasm. Multiple hepatic metastases. Mesenteric nodal metastases. Aortic Atherosclerosis (ICD10-I70.0). Critical Value/emergent results were called by telephone at the time of interpretation on 11/29/2019 at 12:24 pm to provider DR. ELIZABETH REES , who verbally acknowledged these results. Electronically Signed   By: Lavonia Dana M.D.   On: 11/29/2019 12:27   ECHOCARDIOGRAM COMPLETE  Result Date: 11/29/2019    ECHOCARDIOGRAM REPORT   Patient Name:   Kristie Cowman Date of Exam: 11/29/2019 Medical Rec #:  [id number]         Height:        62.0 in Accession #:    [id number]        Weight:       210.0 lb Date of Birth:  [birth year]         BSA:          1.952 m Patient Age:    26 years          BP:           133/77 mmHg Patient Gender: F                 HR:           97 bpm. Exam Location:  Inpatient Procedure: 2D Echo Indications:    Pulmonary Embolus I26.99  History:        Patient has no prior history of Echocardiogram examinations.                 Risk Factors:Former Smoker.  Sonographer:    Mikki Santee RDCS (AE) Referring Phys: [id number] Michell Heinrich PAHWANI IMPRESSIONS  1. Left ventricular ejection fraction, by estimation, is 60 to 65%. The left ventricle has normal function. The left ventricle has no regional wall motion abnormalities. There is mild concentric left ventricular hypertrophy. Left ventricular diastolic parameters are consistent with Grade I diastolic dysfunction (impaired relaxation).  2. Right ventricular systolic function is normal. The right ventricular size is normal. There is normal pulmonary artery systolic pressure.  3. The mitral valve is normal in structure. No evidence of mitral  valve regurgitation. No evidence of mitral stenosis.  4. The aortic valve is normal in structure. Aortic valve regurgitation is not visualized. No aortic stenosis is present.  5. The inferior vena cava is normal in size with greater than 50% respiratory variability, suggesting right atrial pressure of 3 mmHg. FINDINGS  Left Ventricle: Left ventricular ejection fraction, by estimation, is 60 to 65%. The left ventricle has normal function. The left ventricle has no regional wall motion abnormalities. The left ventricular internal cavity size was normal in size. There is  mild concentric left ventricular hypertrophy. Left ventricular diastolic parameters are consistent with Grade I diastolic dysfunction (impaired relaxation). Right Ventricle: The right ventricular size is normal. No increase in right ventricular wall thickness. Right ventricular  systolic function is normal. There is normal pulmonary artery systolic pressure. The tricuspid regurgitant velocity is 2.07 m/s, and  with an assumed right atrial pressure of 3 mmHg, the estimated right ventricular systolic pressure is [id number]. Left Atrium: Left atrial size was normal in size. Right Atrium: Right atrial size was normal in size. Pericardium: Trivial pericardial effusion is present. Mitral Valve: The mitral valve is normal in structure. Normal mobility of the mitral valve leaflets. No evidence of mitral valve regurgitation. No evidence of mitral valve stenosis. Tricuspid Valve: The tricuspid valve is normal in structure. Tricuspid valve regurgitation is trivial. No evidence of tricuspid stenosis. Aortic Valve: The aortic valve is normal in structure. Aortic valve regurgitation is not visualized. No aortic stenosis is present. Pulmonic Valve: The pulmonic valve was normal in structure. Pulmonic valve regurgitation is not visualized. No evidence of pulmonic stenosis. Aorta: The aortic root is normal in size and structure. Venous: The inferior vena cava is normal in size with greater than 50% respiratory variability, suggesting right atrial pressure of 3 mmHg. IAS/Shunts: No atrial level shunt detected by color flow Doppler.  LEFT VENTRICLE PLAX 2D LVIDd:         4.07 cm  Diastology LVIDs:         2.36 cm  LV e' lateral:   9.46 cm/s LV PW:         1.28 cm  LV E/e' lateral: 7.3 LV IVS:        1.01 cm  LV e' medial:    8.16 cm/s LVOT diam:     2.00 cm  LV E/e' medial:  8.4 LV SV:         55 LV SV Index:   28 LVOT Area:     3.14 cm  RIGHT VENTRICLE RV S prime:     18.60 cm/s TAPSE (M-mode): 1.4 cm LEFT ATRIUM             Index       RIGHT ATRIUM           Index LA diam:        3.20 cm 1.64 cm/m  RA Area:     10.60 cm LA Vol (A2C):   21.4 ml 10.96 ml/m RA Volume:   19.60 ml  10.04 ml/m LA Vol (A4C):   37.3 ml 19.11 ml/m LA Biplane Vol: 31.1 ml 15.93 ml/m  AORTIC VALVE LVOT Vmax:   117.00 cm/s LVOT  Vmean:  67.500 cm/s LVOT VTI:    0.176 m  AORTA Ao Root diam: 2.60 cm MITRAL VALVE               TRICUSPID VALVE MV Area (PHT): 5.42 cm    TR Peak grad:   17.1 mmHg  MV Decel Time: 140 msec    TR Vmax:        207.00 cm/s MV E velocity: 68.90 cm/s MV A velocity: 82.00 cm/s  SHUNTS MV E/A ratio:  0.84        Systemic VTI:  0.18 m                            Systemic Diam: 2.00 cm Skeet Latch MD Electronically signed by Skeet Latch MD Signature Date/Time: 11/29/2019/6:13:07 PM    Final    VAS [place] LOWER EXTREMITY VENOUS (DVT)  Result Date: 11/29/2019  Lower Venous DVTStudy Indications: Pulmonary embolism.  Comparison Study: no prior Performing Technologist: Abram Sander RVS  Examination Guidelines: A complete evaluation includes B-mode imaging, spectral Doppler, color Doppler, and power Doppler as needed of all accessible portions of each vessel. Bilateral testing is considered an integral part of a complete examination. Limited examinations for reoccurring indications may be performed as noted. The reflux portion of the exam is performed with the patient in reverse Trendelenburg.  +---------+---------------+---------+-----------+----------+--------------+ RIGHT    CompressibilityPhasicitySpontaneityPropertiesThrombus Aging +---------+---------------+---------+-----------+----------+--------------+ CFV      Full           Yes      Yes                                 +---------+---------------+---------+-----------+----------+--------------+ SFJ      Full                                                        +---------+---------------+---------+-----------+----------+--------------+ FV Prox  Full                                                        +---------+---------------+---------+-----------+----------+--------------+ FV Mid   Full                                                        +---------+---------------+---------+-----------+----------+--------------+ FV  DistalFull                                                        +---------+---------------+---------+-----------+----------+--------------+ PFV      Full                                                        +---------+---------------+---------+-----------+----------+--------------+ POP      Full           Yes      Yes                                 +---------+---------------+---------+-----------+----------+--------------+  PTV      Full                                                        +---------+---------------+---------+-----------+----------+--------------+ PERO     Full                                                        +---------+---------------+---------+-----------+----------+--------------+   +---------+---------------+---------+-----------+----------+--------------+ LEFT     CompressibilityPhasicitySpontaneityPropertiesThrombus Aging +---------+---------------+---------+-----------+----------+--------------+ CFV      Full           Yes      Yes                                 +---------+---------------+---------+-----------+----------+--------------+ SFJ      Full                                                        +---------+---------------+---------+-----------+----------+--------------+ FV Prox  Full                                                        +---------+---------------+---------+-----------+----------+--------------+ FV Mid   Full                                                        +---------+---------------+---------+-----------+----------+--------------+ FV DistalFull                                                        +---------+---------------+---------+-----------+----------+--------------+ PFV      Full                                                        +---------+---------------+---------+-----------+----------+--------------+ POP      Full           Yes      Yes                                  +---------+---------------+---------+-----------+----------+--------------+ PTV      Full                                                        +---------+---------------+---------+-----------+----------+--------------+  PERO                                                  Not visualized +---------+---------------+---------+-----------+----------+--------------+ Gastroc  None                                         Acute          +---------+---------------+---------+-----------+----------+--------------+     Summary: RIGHT: - There is no evidence of deep vein thrombosis in the lower extremity.  - No cystic structure found in the popliteal fossa.  LEFT: - Findings consistent with acute deep vein thrombosis involving the left gastrocnemius veins. - No cystic structure found in the popliteal fossa.  *See table(s) above for measurements and observations. Electronically signed by Curt Jews MD on 11/29/2019 at 4:25:09 PM.    Final    US Abdomen Limited RUQ  Result Date: 11/29/2019 CLINICAL DATA:  Right upper quadrant pain 3 months. EXAM: ULTRASOUND ABDOMEN LIMITED RIGHT UPPER QUADRANT COMPARISON:  05/08/2007 FINDINGS: Gallbladder: Significant cholelithiasis with largest stone measuring 1.1 cm. Gallbladder wall thickening measuring 5.5 mm. Negative sonographic Murphy sign. No adjacent free fluid. Common bile duct: Diameter: 5.7 mm. Liver: Heterogeneous parenchymal echogenicity with numerous liver masses many of which have a more hyperechoic periphery with more hypoechoic appearance centrally suggesting a somewhat target appearance. There is a large irregular shape cystic lesion with echogenic periphery over the right lobe measuring approximately 7.3 cm. Portal vein is patent on color Doppler imaging with normal direction of blood flow towards the liver. Other: None. IMPRESSION: 1. Moderate to severe cholelithiasis with mild gallbladder wall thickening of 5.5 mm. Recommend  clinical correlation as findings could be seen with acute cholecystitis. 2. Numerous liver masses with the largest over the right lobe measuring 7.3 cm containing central cystic/necrotic area. Findings are highly suspicious for metastatic disease. Recommend clinical correlation as further evaluation with chest/abdominal/pelvic CT may be helpful to search for primary malignancy. Electronically Signed   By: Marin Olp M.D.   On: 11/29/2019 09:11        Scheduled Meds: Continuous Infusions: . sodium chloride 75 mL/hr at 11/30/19 0418  . heparin 1,650 Units/hr (11/30/19 0419)     LOS: 1 day   Time spent= 35 mins    Jalene Lacko Arsenio Loader, MD [facility]  If 7PM-7AM, please contact night-coverage  11/30/2019, 8:23 AM

## 2019-11-30 NOTE — Progress Notes (Signed)
ANTICOAGULATION CONSULT NOTE - Follow Up Consult  Pharmacy Consult for heparin Indication: pulmonary embolus  No Known Allergies  Patient Measurements: Height: 5\' 2"  (157.5 cm) Weight: 78.6 kg (173 lb 3.2 oz) IBW/kg (Calculated) : 50.1 Heparin Dosing Weight: 72.4 kg  Vital Signs: Temp: 100 F (37.8 C) (04/30 1112) Temp Source: Oral (04/30 1112) BP: 114/78 (04/30 1112)  Labs: Recent Labs    11/28/19 1309 11/28/19 1309 11/29/19 0757 11/29/19 1400 11/29/19 2009 11/30/19 0309 11/30/19 1013 11/30/19 1822  HGB 8.6*   < >  --  8.4*  --  7.4*  --   --   HCT 28.4*  --   --  28.1*  --  23.8*  --   --   PLT 467*  --   --  464*  --  441*  --   --   HEPARINUNFRC  --   --   --   --    < > 0.24* 0.32 0.38  CREATININE 0.77  --   --  0.61  --  0.61  --   --   TROPONINIHS 13  --  25*  --   --   --   --   --    < > = values in this interval not displayed.    Estimated Creatinine Clearance: 75.3 mL/min (by C-G formula based on SCr of 0.61 mg/dL).   Assessment: 59 yr old female presenting to the ED with CP and abdominal pain was found to have multiple bilateral PE's with right heart strain. 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 Pharmacy was consulted to start IV heparin. Pt was not on anticoagulation PTA. Plans noted for colonoscopy on 5/1 (biopsy of mass).  Heparin level drawn ~7 hrs after heparin infusion was increased to 1750 units/hr was 0.38 units/ml, which is within the goal range for this pt. H/H 7.4/23.8, platelets 441. Per RN, no issues with IV or bleeding observed.  Goal of Therapy:  Heparin level 0.3-0.7 units/ml Monitor platelets by anticoagulation protocol: Yes   Plan:  Continue heparin infusion at 1750 units/hr Check heparin level in 6 hrs Monitor daily heparin level, CBC Monitor for signs/symptoms of bleeding  Gillermina Hu, PharmD, BCPS, [facility] Clinical Pharmacist 11/30/19, 19:20  PM

## 2019-11-30 NOTE — Progress Notes (Signed)
ANTICOAGULATION CONSULT NOTE - Follow Up Consult  Pharmacy Consult for heparin Indication: pulmonary embolus  No Known Allergies  Patient Measurements: Height: 5\' 2"  (157.5 cm) Weight: 78.6 kg (173 lb 3.2 oz) IBW/kg (Calculated) : 50.1 Heparin Dosing Weight: 72.4 kg  Vital Signs: Temp: 99.6 F (37.6 C) (04/30 0746) Temp Source: Oral (04/30 0746) BP: 114/76 (04/30 0746) Pulse Rate: 98 (04/30 0521)  Labs: Recent Labs    11/28/19 1309 11/28/19 1309 11/29/19 0757 11/29/19 1400 11/29/19 2009 11/30/19 0309 11/30/19 1013  HGB 8.6*   < >  --  8.4*  --  7.4*  --   HCT 28.4*  --   --  28.1*  --  23.8*  --   PLT 467*  --   --  464*  --  441*  --   HEPARINUNFRC  --   --   --   --  0.16* 0.24* 0.32  CREATININE 0.77  --   --  0.61  --  0.61  --   TROPONINIHS 13  --  25*  --   --   --   --    < > = values in this interval not displayed.    Estimated Creatinine Clearance: 75.3 mL/min (by C-G formula based on SCr of 0.61 mg/dL).   Assessment: 59 yr old female presenting to the ED with CP and abdominal pain was found to have multiple bilateral PE's with right heart strain. 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 Pharmacy was consulted to start IV heparin. Pt was not on anticoagulation PTA. Plans noted for colonoscopy on 5/1 (biopsy of mass) -heparin level at goal    Goal of Therapy:  Heparin level 0.3-0.7 units/ml Monitor platelets by anticoagulation protocol: Yes   Plan:  -Increase heparin infusion to 1750 units/hr -Heparin level in 6 hours and daily wth CBC daily  Hildred Laser, PharmD Clinical Pharmacist **Pharmacist phone directory can now be found on [email or web address] ([email or web address]).  Listed under [place].

## 2019-12-01 ENCOUNTER — Encounter (HOSPITAL_COMMUNITY)
Admission: EM | Disposition: A | Discharge status: Home / Self Care | Payer: Self-pay | Source: Home / Self Care | Attending: Internal Medicine

## 2019-12-01 ENCOUNTER — Inpatient Hospital Stay (HOSPITAL_COMMUNITY): Payer: Self-pay | Admitting: Anesthesiology

## 2019-12-01 ENCOUNTER — Encounter (HOSPITAL_COMMUNITY): Payer: Self-pay | Admitting: Internal Medicine

## 2019-12-01 DIAGNOSIS — C180 Malignant neoplasm of cecum: Secondary | ICD-10-CM

## 2019-12-01 DIAGNOSIS — B20 Human immunodeficiency virus [HIV] disease: Secondary | ICD-10-CM

## 2019-12-01 DIAGNOSIS — R778 Other specified abnormalities of plasma proteins: Secondary | ICD-10-CM

## 2019-12-01 DIAGNOSIS — C799 Secondary malignant neoplasm of unspecified site: Secondary | ICD-10-CM

## 2019-12-01 DIAGNOSIS — K56691 Other complete intestinal obstruction: Secondary | ICD-10-CM

## 2019-12-01 DIAGNOSIS — K635 Polyp of colon: Secondary | ICD-10-CM

## 2019-12-01 DIAGNOSIS — D122 Benign neoplasm of ascending colon: Secondary | ICD-10-CM

## 2019-12-01 DIAGNOSIS — D125 Benign neoplasm of sigmoid colon: Secondary | ICD-10-CM

## 2019-12-01 HISTORY — PX: SUBMUCOSAL TATTOO INJECTION: SHX6856

## 2019-12-01 HISTORY — PX: BIOPSY: SHX5522

## 2019-12-01 HISTORY — PX: COLONOSCOPY WITH PROPOFOL: SHX5780

## 2019-12-01 HISTORY — PX: HEMOSTASIS CLIP PLACEMENT: SHX6857

## 2019-12-01 HISTORY — PX: POLYPECTOMY: SHX5525

## 2019-12-01 LAB — BASIC METABOLIC PANEL
Anion gap: 13 (ref 5–15)
BUN: <5 mg/dL — ABNORMAL LOW (ref 6–20)
CO2: 19 mmol/L — ABNORMAL LOW (ref 22–32)
Calcium: 7.8 mg/dL — ABNORMAL LOW (ref 8.9–10.3)
Chloride: 102 mmol/L (ref 98–111)
Creatinine, Ser: 0.55 mg/dL (ref 0.44–1.00)
GFR calc Af Amer: >60 mL/min (ref >60)
GFR calc non Af Amer: >60 mL/min (ref >60)
Glucose, Bld: 91 mg/dL (ref 70–99)
Potassium: 3 mmol/L — ABNORMAL LOW (ref 3.5–5.1)
Sodium: 134 mmol/L — ABNORMAL LOW (ref 135–145)

## 2019-12-01 LAB — CBC
HCT: 24 % — ABNORMAL LOW (ref 36.0–46.0)
Hemoglobin: 7.4 g/dL — ABNORMAL LOW (ref 12.0–15.0)
MCH: 23.7 pg — ABNORMAL LOW (ref 26.0–34.0)
MCHC: 30.8 g/dL (ref 30.0–36.0)
MCV: 76.9 fL — ABNORMAL LOW (ref 80.0–100.0)
Platelets: 510 10*3/uL — ABNORMAL HIGH (ref 150–400)
RBC: 3.12 MIL/uL — ABNORMAL LOW (ref 3.87–5.11)
RDW: 15.5 % (ref 11.5–15.5)
WBC: 11 10*3/uL — ABNORMAL HIGH (ref 4.0–10.5)
nRBC: 0 % (ref 0.0–0.2)

## 2019-12-01 LAB — HEPATITIS B SURFACE ANTIGEN: Hepatitis B Surface Ag: NONREACTIVE

## 2019-12-01 LAB — MAGNESIUM: Magnesium: 1.8 mg/dL (ref 1.7–2.4)

## 2019-12-01 LAB — HEPARIN LEVEL (UNFRACTIONATED): Heparin Unfractionated: 0.31 IU/mL (ref 0.30–0.70)

## 2019-12-01 LAB — HIV-1 RNA QUANT-NO REFLEX-BLD
HIV 1 RNA Quant: 7110 copies/mL
LOG10 HIV-1 RNA: 3.852 log10copy/mL

## 2019-12-01 LAB — HEPATITIS C ANTIBODY: HCV Ab: NONREACTIVE

## 2019-12-01 SURGERY — COLONOSCOPY WITH PROPOFOL
Anesthesia: Monitor Anesthesia Care

## 2019-12-01 MED ORDER — BICTEGRAVIR-EMTRICITAB-TENOFOV 50-200-25 MG PO TABS
1.0000 | ORAL_TABLET | Freq: Every day | ORAL | Status: DC
  Administered 2019-12-01 – 2019-12-06 (×6): 1 via ORAL
  Filled 2019-12-01 (×7): qty 1

## 2019-12-01 MED ORDER — MAGNESIUM SULFATE 2 GM/50ML IV SOLN
2.0000 g | Freq: Once | INTRAVENOUS | Status: AC
  Administered 2019-12-01: 2 g via INTRAVENOUS
  Filled 2019-12-01: qty 50

## 2019-12-01 MED ORDER — PHENYLEPHRINE HCL (PRESSORS) 10 MG/ML IV SOLN
INTRAVENOUS | Status: DC | PRN
Start: 2019-12-01 — End: 2019-12-01
  Administered 2019-12-01: 80 ug via INTRAVENOUS

## 2019-12-01 MED ORDER — ONDANSETRON HCL 4 MG/2ML IJ SOLN
INTRAMUSCULAR | Status: DC | PRN
  Administered 2019-12-01: 4 mg via INTRAVENOUS

## 2019-12-01 MED ORDER — SPOT INK MARKER SYRINGE KIT
PACK | SUBMUCOSAL | Status: AC
  Filled 2019-12-01: qty 5

## 2019-12-01 MED ORDER — PROPOFOL 500 MG/50ML IV EMUL
INTRAVENOUS | Status: DC | PRN
  Administered 2019-12-01: 100 ug/kg/min via INTRAVENOUS

## 2019-12-01 MED ORDER — POTASSIUM CHLORIDE 10 MEQ/100ML IV SOLN
10.0000 meq | INTRAVENOUS | Status: AC
  Administered 2019-12-01 (×6): 10 meq via INTRAVENOUS
  Filled 2019-12-01 (×6): qty 100

## 2019-12-01 MED ORDER — POTASSIUM CHLORIDE 10 MEQ/100ML IV SOLN: 10.0000 meq | INTRAVENOUS | Status: DC

## 2019-12-01 MED ORDER — HEPARIN (PORCINE) 25000 UT/250ML-% IV SOLN
2000.0000 [IU]/h | INTRAVENOUS | Status: DC
  Administered 2019-12-01 – 2019-12-02 (×2): 1800 [IU]/h via INTRAVENOUS
  Administered 2019-12-02 – 2019-12-05 (×7): 2000 [IU]/h via INTRAVENOUS
  Filled 2019-12-01 (×9): qty 250

## 2019-12-01 MED ORDER — PROPOFOL 10 MG/ML IV BOLUS
INTRAVENOUS | Status: DC | PRN
  Administered 2019-12-01: 30 mg via INTRAVENOUS

## 2019-12-01 MED ORDER — LIDOCAINE HCL (CARDIAC) PF 100 MG/5ML IV SOSY
PREFILLED_SYRINGE | INTRAVENOUS | Status: DC | PRN
  Administered 2019-12-01: 80 mg via INTRATRACHEAL

## 2019-12-01 SURGICAL SUPPLY — 22 items

## 2019-12-01 NOTE — Progress Notes (Addendum)
ANTICOAGULATION CONSULT NOTE - Follow Up Consult  Pharmacy Consult for heparin Indication: pulmonary embolus  No Known Allergies  Patient Measurements: Height: 5\' 2"  (157.5 cm) Weight: 79.6 kg (175 lb 6.4 oz) IBW/kg (Calculated) : 50.1 Heparin Dosing Weight: 72.4 kg  Vital Signs: Temp: 98.8 F (37.1 C) (05/01 0455) Temp Source: Oral (05/01 0455) BP: 141/91 (05/01 0455) Pulse Rate: 113 (05/01 0455)  Labs: Recent Labs    11/28/19 1309 11/28/19 1309 11/29/19 0757 11/29/19 1400 11/29/19 2009 11/30/19 0309 11/30/19 0309 11/30/19 1013 11/30/19 1822 12/01/19 0334 12/01/19 0339  HGB 8.6*   < >  --  8.4*  --  7.4*  --   --   --  7.4*  --   HCT 28.4*   < >  --  28.1*  --  23.8*  --   --   --  24.0*  --   PLT 467*   < >  --  464*  --  441*  --   --   --  510*  --   HEPARINUNFRC  --   --   --   --    < > 0.24*   < > 0.32 0.38  --  0.31  CREATININE 0.77   < >  --  0.61  --  0.61  --   --   --  0.55  --   TROPONINIHS 13  --  25*  --   --   --   --   --   --   --   --    < > = values in this interval not displayed.    Estimated Creatinine Clearance: 75.8 mL/min (by C-G formula based on SCr of 0.55 mg/dL).   Assessment: 59 yr old female presenting to the ED with CP and abdominal pain was found to have multiple bilateral PE's with right heart strain. 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 Pharmacy was consulted to start IV heparin. Pt was not on anticoagulation PTA. Plans noted for colonoscopy on 5/1 (biopsy of mass).  Heparin level is therapeutic at 0.31, on 1750 units/hr. Hgb 7.4, plt 510. No s/sx of bleeding or infusion issues.   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 Plan for Oncology to follow. Okay per GI to resume heparin infusion. Will resume heparin infusion at 1800 units/hr (since on lower end of goal range this morning) and get level in morning since has been therapeutic.   Antonietta Jewel, PharmD, [place] Clinical Pharmacist

## 2019-12-01 NOTE — Transfer of Care (Signed)
Immediate Anesthesia Transfer of Care Note  Patient: Anita Hoffman  Procedure(s) Performed: COLONOSCOPY WITH PROPOFOL (N/A ) BIOPSY SUBMUCOSAL TATTOO INJECTION POLYPECTOMY HEMOSTASIS CLIP PLACEMENT  Patient Location: Endoscopy Unit  Anesthesia Type:MAC  Level of Consciousness: drowsy and patient cooperative  Airway & Oxygen Therapy: Patient Spontanous Breathing and Patient connected to face mask oxygen  Post-op Assessment: Report given to RN and Post -op Vital signs reviewed and stable  Post vital signs: Reviewed and stable  Last Vitals:  Vitals Value Taken Time  BP 129/98 12/01/19 1242  Temp    Pulse 88 12/01/19 1244  Resp 24 12/01/19 1244  SpO2 100 % 12/01/19 1244  Vitals shown include unvalidated device data.  Last Pain:  Vitals:   12/01/19 1018  TempSrc: Oral  PainSc: 0-No pain      Patients Stated Pain Goal: 0 (95/63/87 5643)  Complications: No apparent anesthesia complications

## 2019-12-01 NOTE — Anesthesia Procedure Notes (Signed)
Procedure Name: MAC Date/Time: 12/01/2019 12:02 PM Performed by: Kathryne Hitch, CRNA Pre-anesthesia Checklist: Patient identified, Emergency Drugs available, Suction available and Patient being monitored Patient Re-evaluated:Patient Re-evaluated prior to induction Oxygen Delivery Method: Simple face mask Induction Type: IV induction Dental Injury: Teeth and Oropharynx as per pre-operative assessment

## 2019-12-01 NOTE — Progress Notes (Signed)
OT Cancellation Note  Patient Details Name: Anita Hoffman MRN: [id number] DOB: 10-03-1960   Cancelled Treatment:    Reason Eval/Treat Not Completed: Patient at procedure or test/ unavailable Pt at colonoscopy at time OT attempted to initiate POC. Will continue to follow as available and appropriate.   Zenovia Jarred, MSOT, OTR/L Acute Rehabilitation Services [facility] Office Number: [phone] Pager: [phone]  Zenovia Jarred 12/01/2019, 2:13 PM

## 2019-12-01 NOTE — Interval H&P Note (Signed)
History and Physical Interval Note:  12/01/2019 11:25 AM  Anita Hoffman  has presented today for surgery, with the diagnosis of Cecal mass, liver and mesenteric node mets.  Bilateral PE..  The various methods of treatment have been discussed with the patient and family. After consideration of risks, benefits and other options for treatment, the patient has consented to  Procedure(s): COLONOSCOPY WITH PROPOFOL (N/A) as a surgical intervention.  The patient's history has been reviewed, patient examined, no change in status, stable for surgery.  I have reviewed the patient's chart and labs.  Questions were answered to the patient's satisfaction.     [name]

## 2019-12-01 NOTE — Consult Note (Signed)
,     Date of Admission:  11/28/2019          Reason for Consult: HIV infection    Referring Provider: CHAMP   Assessment:  1. Newly diagnosed HIV with healthy CD4 2. 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 PE 4. Tobacco use   Plan:  1. Start Biktarvy 1 pill once a day and checking standard HIV labs 2. She is undergoing endoscopy today 3. While we can help her get ARV using [employer] and pharma assistance she will undoubtedly need help to get coverage for anticoagulation and likely chemotherapy  Principal Problem:   Bilateral pulmonary embolism (HCC) Active Problems:   Cecal neoplasm   Liver metastases (HCC)   Elevated troponin   RUQ abdominal pain   Scheduled Meds: . [MAR Hold] bictegravir-emtricitabine-tenofovir AF  1 tablet Oral Daily  . [MAR Hold] ferrous sulfate  325 mg Oral BID WC   Continuous Infusions: . sodium chloride 75 mL/hr at 12/01/19 0957  . [MAR Hold] magnesium sulfate bolus IVPB    . [MAR Hold] potassium chloride     PRN Meds:.[MAR Hold] acetaminophen **OR** [MAR Hold] acetaminophen, [MAR Hold]  HYDROmorphone (DILAUDID) injection, [MAR Hold] ipratropium-albuterol, [MAR Hold] ondansetron **OR** [MAR Hold] ondansetron (ZOFRAN) IV, [MAR Hold] polyethylene glycol, [MAR Hold] senna-docusate  HPI: Anita Hoffman is a 59 y.o. female with past medical history significant for smoking who came to the to the ER with worsening abdominal pain and shortness of breath.  She has been found to have a pulmonary embolism on CT angiogram.  CT of the abdomen unfortunately also shows a large cecal mass with apparent metastases to the liver.  Her HIV antibody fourth-generation test is positive.  I was alerted to her HIV test positivity and came to see the patient as an automatic consult.  She is undergoing endoscopy to work-up her cecal mass at present.  I am initiating Biktarvy to treat her HIV which is really not going to be her biggest  problem.  This antiviral should be well-tolerated by her and should fit well with anticoagulation and with potential chemotherapeutic agents that are likely in her near future.  We can certainly arrange for her to get a 30-day supply of Biktarvy for her HIV and get her signed up for the HIV medication assistance program.  However her larger issues are going to be needing to get coverage for chemotherapy I suspect and certainly her anticoagulation.      Review of Systems: Review of Systems  Constitutional: Negative for chills, fever, malaise/fatigue and weight loss.  HENT: Negative for congestion and sore throat.   Eyes: Negative for blurred vision and photophobia.  Respiratory: Positive for shortness of breath. Negative for cough and wheezing.   Cardiovascular: Negative for chest pain, palpitations and leg swelling.  Gastrointestinal: Positive for abdominal pain. Negative for blood in stool, constipation, diarrhea, heartburn, melena, nausea and vomiting.  Genitourinary: Negative for dysuria, flank pain and hematuria.  Musculoskeletal: Negative for back pain, falls, joint pain and myalgias.  Skin: Negative for itching and rash.  Neurological: Negative for dizziness, focal weakness, loss of consciousness, weakness and headaches.  Endo/Heme/Allergies: Does not bruise/bleed easily.  Psychiatric/Behavioral: Negative for depression and suicidal ideas. The patient does not have insomnia.     Past Medical History:  Diagnosis Date  . Former smoker   . Medical history non-contributory     Social History   Tobacco Use  . Smoking status: Former Smoker  Packs/day: 0.50    Types: Cigarettes    Quit date: 11/29/2003    Years since quitting: 16.0  . Smokeless tobacco: Never Used  Substance Use Topics  . Alcohol use: Not Currently    Comment: 1990  . Drug use: Yes    Types: Marijuana    Comment: 1990    History reviewed. No pertinent family history. No Known  Allergies  OBJECTIVE: Blood pressure 136/89, pulse (!) 113, temperature 99.4 F (37.4 C), temperature source Oral, resp. rate (!) 22, height 5\' 2"  (1.575 m), weight 76.6 kg, SpO2 98 %.  Physical Exam Constitutional:      General: She is not in acute distress.    Appearance: She is obese. She is not diaphoretic.  HENT:     Head: Normocephalic and atraumatic.     Right Ear: External ear normal.     Left Ear: External ear normal.     Nose: Nose normal.     Mouth/Throat:     Pharynx: No oropharyngeal exudate.  Eyes:     General: No scleral icterus.    Conjunctiva/sclera: Conjunctivae normal.     Pupils: Pupils are equal, round, and reactive to light.  Cardiovascular:     Rate and Rhythm: Normal rate and regular rhythm.     Heart sounds: Normal heart sounds. No murmur. No friction rub. No gallop.   Pulmonary:     Effort: Pulmonary effort is normal. No respiratory distress.     Breath sounds: No wheezing or rales.  Abdominal:     General: There is no distension.     Palpations: Abdomen is soft.  Musculoskeletal:        General: No tenderness. Normal range of motion.     Cervical back: Normal range of motion and neck supple.  Lymphadenopathy:     Cervical: No cervical adenopathy.  Skin:    General: Skin is warm and dry.     Coloration: Skin is not pale.     Findings: No erythema or rash.  Neurological:     General: No focal deficit present.     Mental Status: She is alert and oriented to person, place, and time.     Coordination: Coordination normal.  Psychiatric:        Mood and Affect: Mood normal. Mood is not anxious.        Behavior: Behavior normal. Behavior is not agitated.        Judgment: Judgment normal.     Lab Results Lab Results  Component Value Date   WBC 11.0 (H) 12/01/2019   HGB 7.4 (L) 12/01/2019   HCT 24.0 (L) 12/01/2019   MCV 76.9 (L) 12/01/2019   PLT 510 (H) 12/01/2019    Lab Results  Component Value Date   CREATININE 0.55 12/01/2019   BUN <5  (L) 12/01/2019   NA 134 (L) 12/01/2019   K 3.0 (L) 12/01/2019   CL 102 12/01/2019   CO2 19 (L) 12/01/2019    Lab Results  Component Value Date   ALT 19 11/30/2019   AST 53 (H) 11/30/2019   ALKPHOS 162 (H) 11/30/2019   BILITOT 0.9 11/30/2019     Microbiology: Recent Results (from the past 240 hour(s))  Respiratory Panel by RT PCR (Flu A&B, Covid) - Nasopharyngeal Swab     Status: None   Collection Time: 11/29/19 10:28 AM   Specimen: Nasopharyngeal Swab  Result Value Ref Range Status   SARS Coronavirus 2 by RT PCR NEGATIVE NEGATIVE Final  Comment: (NOTE) SARS-CoV-2 target nucleic acids are NOT DETECTED. The SARS-CoV-2 RNA is generally detectable in upper respiratoy specimens during the acute phase of infection. The lowest concentration of SARS-CoV-2 viral copies this assay can detect is 131 copies/mL. A negative result does not preclude SARS-Cov-2 infection and should not be used as the sole basis for treatment or other patient management decisions. A negative result may occur with  improper specimen collection/handling, submission of specimen other than nasopharyngeal swab, presence of viral mutation(s) within the areas targeted by this assay, and inadequate number of viral copies (<131 copies/mL). A negative result must be combined with clinical observations, patient history, and epidemiological information. The expected result is Negative. Fact Sheet for Patients:  PinkCheek.be Fact Sheet for Healthcare Providers:  GravelBags.it This test is not yet ap proved or cleared by the [place] FDA and  has been authorized for detection and/or diagnosis of SARS-CoV-2 by FDA under an Emergency Use Authorization (EUA). This EUA will remain  in effect (meaning this test can be used) for the duration of the COVID-19 declaration under Section 564(b)(1) of the Act, 21 U.S.C. section 360bbb-3(b)(1), unless the authorization  is terminated or revoked sooner.    Influenza A by PCR NEGATIVE NEGATIVE Final   Influenza B by PCR NEGATIVE NEGATIVE Final    Comment: (NOTE) The Xpert Xpress SARS-CoV-2/FLU/RSV assay is intended as an aid in  the diagnosis of influenza from Nasopharyngeal swab specimens and  should not be used as a sole basis for treatment. Nasal washings and  aspirates are unacceptable for Xpert Xpress SARS-CoV-2/FLU/RSV  testing. Fact Sheet for Patients: PinkCheek.be Fact Sheet for Healthcare Providers: GravelBags.it This test is not yet approved or cleared by the [place] FDA and  has been authorized for detection and/or diagnosis of SARS-CoV-2 by  FDA under an Emergency Use Authorization (EUA). This EUA will remain  in effect (meaning this test can be used) for the duration of the  Covid-19 declaration under Section 564(b)(1) of the Act, 21  U.S.C. section 360bbb-3(b)(1), unless the authorization is  terminated or revoked. Performed at [facility], [place] [address], [place], [place] [address]     Alcide Evener, [place] [facility] [phone] pager  12/01/2019, 11:42 AM

## 2019-12-01 NOTE — Op Note (Signed)
[facility] Patient Name: Anita Hoffman Procedure Date : 12/01/2019 MRN: [id number] Attending MD: Gerrit Heck , MD Date of Birth: 10-20-1960 CSN: [id number] Age: 60 Admit Type: Inpatient Procedure:                Colonoscopy Indications:              Abnormal CT of the GI tract, Upper abdominal pain,                            Weight loss                           59 year old female presents with upper abdominal                            pain, fatigue, SOB, 35# weight loss with admission                            imaging concerning for metastatic colon cancer. CTA                            also with bilateral PE, right heart strain, and                            Doppler ultrasound with DVT in left gastrocnemius,                            started on heparin gtt. Providers:                Gerrit Heck, MD, Carlyn Reichert, RN, Laverda Sorenson, Technician, Haze Boyden, CRNA Referring MD:              Medicines:                Monitored Anesthesia Care Complications:            No immediate complications. Estimated Blood Loss:     Estimated blood loss was minimal. Procedure:                Pre-Anesthesia Assessment:                           - Prior to the procedure, a History and Physical                            was performed, and patient medications and                            allergies were reviewed. The patient's tolerance of                            previous anesthesia was also reviewed. The risks                            and benefits  of the procedure and the sedation                            options and risks were discussed with the patient.                            All questions were answered, and informed consent                            was obtained. Prior Anticoagulants: The patient has                            taken no previous anticoagulant or antiplatelet                            agents. ASA Grade  Assessment: III - A patient with                            severe systemic disease. After reviewing the risks                            and benefits, the patient was deemed in                            satisfactory condition to undergo the procedure.                           After obtaining informed consent, the colonoscope                            was passed under direct vision. Throughout the                            procedure, the patient's blood pressure, pulse, and                            oxygen saturations were monitored continuously. The                            [id number] Olympus colonoscope was                            introduced through the anus and advanced to the the                            cecum. The ileocecal valve was identified. The                            remaining structures were obscured by the mass as                            outlined below. The colonoscopy was technically  difficult and complex due to significant looping.                            Successful completion of the procedure was aided by                            applying abdominal pressure. The patient tolerated                            the procedure well. The quality of the bowel                            preparation was adequate. The ileocecal valve and                            rectum were photographed. Scope In: 12:04:41 PM Scope Out: 12:35:33 PM Scope Withdrawal Time: 0 hours 19 minutes 57 seconds  Total Procedure Duration: 0 hours 30 minutes 52 seconds  Findings:      A frond-like/villous, fungating and ulcerated completely obstructing       large mass was found emanating from the cecum and into the proximal       ascending colon. Ileocecal valve could be seen, but the mass obscured       any advancement into the ileocecal valve. This was biopsied with a cold       forceps for histology. Area 3 cm distal to the mass was tattooed with an        injection of 2 mL of Spot (carbon black). Estimated blood loss was       minimal.      A 8 mm polyp was found in the distal ascending colon. The polyp was       sessile. The polyp was removed with a cold snare. Resection and       retrieval were complete. Estimated blood loss was minimal.      A 12 mm polyp was found in the distal sigmoid colon. The polyp was       sessile. The polyp was removed with a cold snare. Resection and       retrieval were complete. Given persistent oozing at the polypectomy site       and plan to resume systemic anticoagulation, two hemostatic clips were       successfully placed (MR conditional) with complete cessation of       bleeding. There was no bleeding at the end of the procedure.      The retroflexed view of the distal rectum and anal verge was normal and       showed no anal or rectal abnormalities.      The sigmoid colon and ascending colon revealed significantly excessive       looping. Advancing the scope required applying abdominal pressure. 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 Biopsied. Tattooed.                           -  One 8 mm polyp in the distal ascending colon,                            removed with a cold snare. Resected and retrieved.                           - One 12 mm polyp in the distal sigmoid colon,                            removed with a cold snare. Resected and retrieved.                            Clips (MR conditional) were placed.                           - The distal rectum and anal verge are normal on                            retroflexion view.                           - There was significant looping of the colon. Recommendation:           - Return patient to hospital ward for ongoing care.                           - Full liquid diet.                           - Continue present medications.                           - Await pathology  results.                           - General Surgery service following.                           - Recommend Oncology consult.                           - Ok to resume heparin gtt. Procedure Code(s):        --- Professional ---                           [phone], Colonoscopy, flexible; with removal of                            tumor(s), polyp(s), or other lesion(s) by snare                            technique                           [id number], Colonoscopy, flexible; with directed  submucosal injection(s), any substance                           [id number], 59, Colonoscopy, flexible; with biopsy,                            single or multiple Diagnosis Code(s):        --- Professional ---                           C18.0, Malignant neoplasm of cecum                           K56.691, Other complete intestinal obstruction                           K63.5, Polyp of colon                           R10.10, Upper abdominal pain, unspecified                           R63.4, Abnormal weight loss                           R93.3, Abnormal findings on diagnostic imaging of                            other parts of digestive tract CPT copyright 2019 [employer]. All rights reserved. The codes documented in this report are preliminary and upon coder review may  be revised to meet current compliance requirements. Gerrit Heck, MD 12/01/2019 12:52:37 PM Number of Addenda: 0

## 2019-12-01 NOTE — Progress Notes (Signed)
PROGRESS NOTE    Anita Hoffman  [id number] DOB: 1961/05/23 DOA: 11/28/2019 PCP: Patient, No Pcp Per   Brief Narrative:  59 year old with history of tobacco use presented to the hospital with worsening abdominal pain and shortness of breath for 3 weeks.  Also reported of weight loss.  Right upper quadrant ultrasound showed moderate to severe cholelithiasis with gallbladder wall thickening, numerous liver masses.  CT angio showed bilateral PE.  CT abdomen showed large cecal mass.  GI and general surgery were consulted.   Assessment & Plan:   Principal Problem:   Bilateral pulmonary embolism (HCC) Active Problems:   Cecal neoplasm   Liver metastases (HCC)   Elevated troponin   RUQ abdominal pain  Acute bilateral pulmonary embolism with cor pulmonale Left lower extremity DVT -Currently on heparin drip.  Suspect secondary to underlying malignancy -Lower extremity Dopplers-left lower extremity DVT. 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 -CEA  Microcytic anemia, hemoglobin 8.4 Iron deficiency anemia -Status post 1 dose of IV Feraheme.  Now on p.o. supplements.  Bowel regimen. -B12 and TSH within normal limit, folate pending  HIV, reactive -CD4 count around 500.  Viral load pending. -Hepatitis panel.  Cholelithiasis with concerns for cholecystitis, on imaging -No surgery indicated at this time.  Seen by general surgery.  Given her advance diagnosis, I offered her palliative care services as well.  DVT prophylaxis: Heparin drip Code Status: Full code Family Communication: None  Status is: Inpatient  Remains inpatient appropriate because:Ongoing diagnostic testing needed not appropriate for outpatient work up.  Maintain hospital stay   Dispo: The  patient is from: Home              Anticipated d/c is to: Home              Anticipated d/c date is: 2 days              Patient currently is not medically stable to d/c.  Undergoing evaluation for pulmonary embolism complicated by new diagnosis of metastatic cecal mass And microcytic anemia.  Subjective: No acute events overnight.  Review of Systems Otherwise negative except as per HPI, including: General: Denies fever, chills, night sweats or unintended weight loss. Resp: Denies cough, wheezing, shortness of breath. Cardiac: Denies chest pain, palpitations, orthopnea, paroxysmal nocturnal dyspnea. GI: Denies abdominal pain, nausea, vomiting, diarrhea or constipation GU: Denies dysuria, frequency, hesitancy or incontinence MS: Denies muscle aches, joint pain or swelling Neuro: Denies headache, neurologic deficits (focal weakness, numbness, tingling), abnormal gait Psych: Denies anxiety, depression, SI/HI/AVH Skin: Denies new rashes or lesions ID: Denies sick contacts, exotic exposures, travel  Examination:  Constitutional: Not in acute distress Respiratory: Clear to auscultation bilaterally Cardiovascular: Normal sinus rhythm, no rubs Abdomen: Nontender nondistended good bowel sounds Musculoskeletal: No edema noted Skin: No rashes seen Neurologic: CN 2-12 grossly intact.  And nonfocal Psychiatric: Normal judgment and insight. Alert and oriented x 3. Normal mood.  Objective: Vitals:   11/30/19 1112 11/30/19 2045 12/01/19 0028 12/01/19 0455  BP: 114/78 132/80 (!) 135/93 (!) 141/91  Pulse:  98 100 (!) 113  Resp:  (!) 25 (!) 22 18  Temp: 100 F (37.8 C) 99.1 F (37.3 C) 98.9 F (37.2 C) 98.8 F (37.1 C)  TempSrc: Oral Oral Oral Oral  SpO2:  96% 99% 98%  Weight:  79.6 kg  Height:        Intake/Output Summary (Last 24 hours) at 12/01/2019 1009 Last data filed at 11/30/2019 1800 Gross per 24 hour  Intake 1899.03 ml  Output --  Net 1899.03 ml   Filed Weights    11/29/19 1607 11/30/19 0521 12/01/19 0455  Weight: 96.1 kg 78.6 kg 79.6 kg     Data Reviewed:   CBC: Recent Labs  Lab 11/28/19 1309 11/29/19 1400 11/30/19 0309 12/01/19 0334  WBC 10.7* 11.0* 9.6 11.0*  NEUTROABS  --  7.5  --   --   HGB 8.6* 8.4* 7.4* 7.4*  HCT 28.4* 28.1* 23.8* 24.0*  MCV 78.5* 80.7 77.8* 76.9*  PLT 467* 464* 441* [id number]*   Basic Metabolic Panel: Recent Labs  Lab 11/28/19 1309 11/29/19 1400 11/30/19 0309 12/01/19 0334  NA 133* 133* 133* 134*  K 3.1* 3.8 3.4* 3.0*  CL 94* 97* 100 102  CO2 24 21* 24 19*  GLUCOSE 129* 100* 104* 91  BUN 6 7 6  <5*  CREATININE 0.77 0.61 0.61 0.55  CALCIUM 8.4* 8.1* 7.6* 7.8*  MG  --   --   --  1.8   GFR: Estimated Creatinine Clearance: 75.8 mL/min (by C-G formula based on SCr of 0.55 mg/dL). Liver Function Tests: Recent Labs  Lab 11/29/19 0732 11/29/19 1400 11/30/19 0309  AST 59* 56* 53*  ALT 24 21 19   ALKPHOS 175* 176* 162*  BILITOT 1.2 1.2 0.9  PROT 7.6 7.2 6.3*  ALBUMIN 2.6* 2.5* 2.1*   Recent Labs  Lab 11/29/19 0732  LIPASE 17   No results for input(s): AMMONIA in the last 168 hours. Coagulation Profile: No results for input(s): INR, PROTIME in the last 168 hours. Cardiac Enzymes: No results for input(s): CKTOTAL, CKMB, CKMBINDEX, TROPONINI in the last 168 hours. BNP (last 3 results) No results for input(s): PROBNP in the last 8760 hours. HbA1C: No results for input(s): HGBA1C in the last 72 hours. CBG: No results for input(s): GLUCAP in the last 168 hours. Lipid Profile: No results for input(s): CHOL, HDL, LDLCALC, TRIG, CHOLHDL, LDLDIRECT in the last 72 hours. Thyroid Function Tests: Recent Labs    11/30/19 1013  TSH 1.261   Anemia Panel: Recent Labs    11/30/19 1013  VITAMINB12 1,351*  FERRITIN 33  TIBC 232*  IRON 19*   Sepsis Labs: No results for input(s): PROCALCITON, LATICACIDVEN in the last 168 hours.  Recent Results (from the past 240 hour(s))  Respiratory Panel by RT PCR  (Flu A&B, Covid) - Nasopharyngeal Swab     Status: None   Collection Time: 11/29/19 10:28 AM   Specimen: Nasopharyngeal Swab  Result Value Ref Range Status   SARS Coronavirus 2 by RT PCR NEGATIVE NEGATIVE Final    Comment: (NOTE) SARS-CoV-2 target nucleic acids are NOT DETECTED. The SARS-CoV-2 RNA is generally detectable in upper respiratoy specimens during the acute phase of infection. The lowest concentration of SARS-CoV-2 viral copies this assay can detect is 131 copies/mL. A negative result does not preclude SARS-Cov-2 infection and should not be used as the sole basis for treatment or other patient management decisions. A negative result may occur with  improper specimen collection/handling, submission of specimen other than nasopharyngeal swab, presence of viral mutation(s) within the areas targeted by this assay, and inadequate number of viral copies (<131 copies/mL). A negative result must be combined with clinical observations, patient history, and epidemiological information. The expected result is Negative. Fact Sheet for Patients:  PinkCheek.be Fact  Sheet for Healthcare Providers:  GravelBags.it This test is not yet ap proved or cleared by the [place] FDA and  has been authorized for detection and/or diagnosis of SARS-CoV-2 by FDA under an Emergency Use Authorization (EUA). This EUA will remain  in effect (meaning this test can be used) for the duration of the COVID-19 declaration under Section 564(b)(1) of the Act, 21 U.S.C. section 360bbb-3(b)(1), unless the authorization is terminated or revoked sooner.    Influenza A by PCR NEGATIVE NEGATIVE Final   Influenza B by PCR NEGATIVE NEGATIVE Final    Comment: (NOTE) The Xpert Xpress SARS-CoV-2/FLU/RSV assay is intended as an aid in  the diagnosis of influenza from Nasopharyngeal swab specimens and  should not be used as a sole basis for treatment. Nasal  washings and  aspirates are unacceptable for Xpert Xpress SARS-CoV-2/FLU/RSV  testing. Fact Sheet for Patients: PinkCheek.be Fact Sheet for Healthcare Providers: GravelBags.it This test is not yet approved or cleared by the [place] FDA and  has been authorized for detection and/or diagnosis of SARS-CoV-2 by  FDA under an Emergency Use Authorization (EUA). This EUA will remain  in effect (meaning this test can be used) for the duration of the  Covid-19 declaration under Section 564(b)(1) of the Act, 21  U.S.C. section 360bbb-3(b)(1), unless the authorization is  terminated or revoked. Performed at [facility], [place] [address], [place], [place] [address]          Radiology Studies: CT Angio Chest PE W/Cm &/Or Wo Cm  Result Date: 11/29/2019 CLINICAL DATA:  RIGHT upper quadrant and RIGHT lower chest pain, shortness of breath, lower extremity edema, former smoker EXAM: CT ANGIOGRAPHY CHEST CT ABDOMEN AND PELVIS WITH CONTRAST TECHNIQUE: Multidetector CT imaging of the chest was performed using the standard protocol during bolus administration of intravenous contrast. Multiplanar CT image reconstructions and MIPs were obtained to evaluate the vascular anatomy. Multidetector CT imaging of the abdomen and pelvis was performed using the standard protocol during bolus administration of intravenous contrast. CONTRAST:  117mL OMNIPAQUE IOHEXOL 300 MG/ML SOLN IV. No oral contrast. COMPARISON:  CT abdomen and pelvis 11/22/2006 FINDINGS: CTA CHEST FINDINGS Cardiovascular: Aorta normal caliber without aneurysm or dissection. Pulmonary arteries well opacified. Filling defects are seen within the pulmonary arteries bilaterally RIGHT greater than LEFT consistent with pulmonary emboli. These are greatest in the RIGHT lower lobe but affect all remaining lobes as well. RV/LV ratio = 1.0, elevated, consistent with RIGHT heart strain.  Mediastinum/Nodes: Base of cervical region normal appearance. Normal sized axillary nodes bilaterally. No thoracic adenopathy. Esophagus unremarkable. Lungs/Pleura: Linear subsegmental atelectasis LEFT lower lobe. Lungs otherwise clear. No pulmonary infiltrate, pleural effusion, or pneumothorax. Musculoskeletal: No acute osseous findings. Review of the MIP images confirms the above findings. CT ABDOMEN and PELVIS FINDINGS Hepatobiliary: Multiple poorly defined mass lesions are seen throughout the liver consistent with extensive hepatic metastatic disease. Largest of these measure 6.6 x 4.2 cm lateral segment LEFT lobe image 43, 5.7 x 5.7 cm posterior RIGHT lobe image 34, anterior RIGHT lobe 7.2 x 5.0 cm image 33, and 7.6 x 7.1 cm anterior RIGHT lobe image 22. Gallbladder unremarkable. Pancreas: Normal appearance Spleen: Normal appearance Adrenals/Urinary Tract: Adrenal glands normal appearance. BILATERAL renal cysts. Kidneys, ureters, and bladder otherwise normal appearance. Stomach/Bowel: Normal appendix. Large mass identified at cecum extending into ileocecal valve region and terminal ileum measuring 5.6 x 4.0 x 4.5 cm consistent with colonic neoplasm. Thickening of terminal ileum without obstruction. Stomach decompressed. Remaining large and  small bowel loops unremarkable. Vascular/Lymphatic: Minimal atherosclerotic calcification aorta. Aorta normal caliber. Few pelvic phleboliths. Mesenteric mass with minimal calcification 4.1 x 2.8 cm image 54 likely nodal metastasis. Additional enlarged pathologic node mesentery medial to cecal tip 1.7 x 1.5 cm. Normal sized inguinal lymph nodes. Reproductive: Uterus and adnexa unremarkable Other: Tiny umbilical hernia containing fat. No free air or free fluid. Musculoskeletal: Bones demineralized. Lipoma involving the distal RIGHT iliopsoas muscle/tendon, 3.6 x 2.5 cm x 8.9 cm length. Review of the MIP images confirms the above findings. IMPRESSION: Multiple BILATERAL  pulmonary emboli RIGHT greater than LEFT. Positive for acute PE with CT evidence of right heart strain (RV/LV Ratio = 1.0) consistent with at least submassive (intermediate risk) PE; the presence of right heart strain has been associated with an increased risk of morbidity and mortality. Large cecal mass extending into ileocecal valve region and terminal ileum measuring 5.6 x 4.0 x 4.5 cm consistent with colonic neoplasm. Multiple hepatic metastases. Mesenteric nodal metastases. Aortic Atherosclerosis (ICD10-I70.0). Critical Value/emergent results were called by telephone at the time of interpretation on 11/29/2019 at 12:24 pm to provider DR. ELIZABETH REES , who verbally acknowledged these results. Electronically Signed   By: Lavonia Dana M.D.   On: 11/29/2019 12:27   CT Abdomen Pelvis W Contrast  Result Date: 11/29/2019 CLINICAL DATA:  RIGHT upper quadrant and RIGHT lower chest pain, shortness of breath, lower extremity edema, former smoker EXAM: CT ANGIOGRAPHY CHEST CT ABDOMEN AND PELVIS WITH CONTRAST TECHNIQUE: Multidetector CT imaging of the chest was performed using the standard protocol during bolus administration of intravenous contrast. Multiplanar CT image reconstructions and MIPs were obtained to evaluate the vascular anatomy. Multidetector CT imaging of the abdomen and pelvis was performed using the standard protocol during bolus administration of intravenous contrast. CONTRAST:  165mL OMNIPAQUE IOHEXOL 300 MG/ML SOLN IV. No oral contrast. COMPARISON:  CT abdomen and pelvis 11/22/2006 FINDINGS: CTA CHEST FINDINGS Cardiovascular: Aorta normal caliber without aneurysm or dissection. Pulmonary arteries well opacified. Filling defects are seen within the pulmonary arteries bilaterally RIGHT greater than LEFT consistent with pulmonary emboli. These are greatest in the RIGHT lower lobe but affect all remaining lobes as well. RV/LV ratio = 1.0, elevated, consistent with RIGHT heart strain. Mediastinum/Nodes:  Base of cervical region normal appearance. Normal sized axillary nodes bilaterally. No thoracic adenopathy. Esophagus unremarkable. Lungs/Pleura: Linear subsegmental atelectasis LEFT lower lobe. Lungs otherwise clear. No pulmonary infiltrate, pleural effusion, or pneumothorax. Musculoskeletal: No acute osseous findings. Review of the MIP images confirms the above findings. CT ABDOMEN and PELVIS FINDINGS Hepatobiliary: Multiple poorly defined mass lesions are seen throughout the liver consistent with extensive hepatic metastatic disease. Largest of these measure 6.6 x 4.2 cm lateral segment LEFT lobe image 43, 5.7 x 5.7 cm posterior RIGHT lobe image 34, anterior RIGHT lobe 7.2 x 5.0 cm image 33, and 7.6 x 7.1 cm anterior RIGHT lobe image 22. Gallbladder unremarkable. Pancreas: Normal appearance Spleen: Normal appearance Adrenals/Urinary Tract: Adrenal glands normal appearance. BILATERAL renal cysts. Kidneys, ureters, and bladder otherwise normal appearance. Stomach/Bowel: Normal appendix. Large mass identified at cecum extending into ileocecal valve region and terminal ileum measuring 5.6 x 4.0 x 4.5 cm consistent with colonic neoplasm. Thickening of terminal ileum without obstruction. Stomach decompressed. Remaining large and small bowel loops unremarkable. Vascular/Lymphatic: Minimal atherosclerotic calcification aorta. Aorta normal caliber. Few pelvic phleboliths. Mesenteric mass with minimal calcification 4.1 x 2.8 cm image 54 likely nodal metastasis. Additional enlarged pathologic node mesentery medial to cecal tip 1.7 x 1.5 cm.  Normal sized inguinal lymph nodes. Reproductive: Uterus and adnexa unremarkable Other: Tiny umbilical hernia containing fat. No free air or free fluid. Musculoskeletal: Bones demineralized. Lipoma involving the distal RIGHT iliopsoas muscle/tendon, 3.6 x 2.5 cm x 8.9 cm length. Review of the MIP images confirms the above findings. IMPRESSION: Multiple BILATERAL pulmonary emboli RIGHT  greater than LEFT. Positive for acute PE with CT evidence of right heart strain (RV/LV Ratio = 1.0) consistent with at least submassive (intermediate risk) PE; the presence of right heart strain has been associated with an increased risk of morbidity and mortality. Large cecal mass extending into ileocecal valve region and terminal ileum measuring 5.6 x 4.0 x 4.5 cm consistent with colonic neoplasm. Multiple hepatic metastases. Mesenteric nodal metastases. Aortic Atherosclerosis (ICD10-I70.0). Critical Value/emergent results were called by telephone at the time of interpretation on 11/29/2019 at 12:24 pm to provider DR. ELIZABETH REES , who verbally acknowledged these results. Electronically Signed   By: Lavonia Dana M.D.   On: 11/29/2019 12:27   ECHOCARDIOGRAM COMPLETE  Result Date: 11/29/2019    ECHOCARDIOGRAM REPORT   Patient Name:   Kristie Cowman Date of Exam: 11/29/2019 Medical Rec #:  [id number]         Height:       62.0 in Accession #:    [id number]        Weight:       210.0 lb Date of Birth:  [birth year]         BSA:          1.952 m Patient Age:    68 years          BP:           133/77 mmHg Patient Gender: F                 HR:           97 bpm. Exam Location:  Inpatient Procedure: 2D Echo Indications:    Pulmonary Embolus I26.99  History:        Patient has no prior history of Echocardiogram examinations.                 Risk Factors:Former Smoker.  Sonographer:    Mikki Santee RDCS (AE) Referring Phys: [id number] Michell Heinrich PAHWANI IMPRESSIONS  1. Left ventricular ejection fraction, by estimation, is 60 to 65%. The left ventricle has normal function. The left ventricle has no regional wall motion abnormalities. There is mild concentric left ventricular hypertrophy. Left ventricular diastolic parameters are consistent with Grade I diastolic dysfunction (impaired relaxation).  2. Right ventricular systolic function is normal. The right ventricular size is normal. There is normal pulmonary artery  systolic pressure.  3. The mitral valve is normal in structure. No evidence of mitral valve regurgitation. No evidence of mitral stenosis.  4. The aortic valve is normal in structure. Aortic valve regurgitation is not visualized. No aortic stenosis is present.  5. The inferior vena cava is normal in size with greater than 50% respiratory variability, suggesting right atrial pressure of 3 mmHg. FINDINGS  Left Ventricle: Left ventricular ejection fraction, by estimation, is 60 to 65%. The left ventricle has normal function. The left ventricle has no regional wall motion abnormalities. The left ventricular internal cavity size was normal in size. There is  mild concentric left ventricular hypertrophy. Left ventricular diastolic parameters are consistent with Grade I diastolic dysfunction (impaired relaxation). Right Ventricle: The right ventricular size is normal. No increase in right ventricular wall  thickness. Right ventricular systolic function is normal. There is normal pulmonary artery systolic pressure. The tricuspid regurgitant velocity is 2.07 m/s, and  with an assumed right atrial pressure of 3 mmHg, the estimated right ventricular systolic pressure is [id number] mmHg. Left Atrium: Left atrial size was normal in size. Right Atrium: Right atrial size was normal in size. Pericardium: Trivial pericardial effusion is present. Mitral Valve: The mitral valve is normal in structure. Normal mobility of the mitral valve leaflets. No evidence of mitral valve regurgitation. No evidence of mitral valve stenosis. Tricuspid Valve: The tricuspid valve is normal in structure. Tricuspid valve regurgitation is trivial. No evidence of tricuspid stenosis. Aortic Valve: The aortic valve is normal in structure. Aortic valve regurgitation is not visualized. No aortic stenosis is present. Pulmonic Valve: The pulmonic valve was normal in structure. Pulmonic valve regurgitation is not visualized. No evidence of pulmonic stenosis. Aorta: The  aortic root is normal in size and structure. Venous: The inferior vena cava is normal in size with greater than 50% respiratory variability, suggesting right atrial pressure of 3 mmHg. IAS/Shunts: No atrial level shunt detected by color flow Doppler.  LEFT VENTRICLE PLAX 2D LVIDd:         4.07 cm  Diastology LVIDs:         2.36 cm  LV e' lateral:   9.46 cm/s LV PW:         1.28 cm  LV E/e' lateral: 7.3 LV IVS:        1.01 cm  LV e' medial:    8.16 cm/s LVOT diam:     2.00 cm  LV E/e' medial:  8.4 LV SV:         55 LV SV Index:   28 LVOT Area:     3.14 cm  RIGHT VENTRICLE RV S prime:     18.60 cm/s TAPSE (M-mode): 1.4 cm LEFT ATRIUM             Index       RIGHT ATRIUM           Index LA diam:        3.20 cm 1.64 cm/m  RA Area:     10.60 cm LA Vol (A2C):   21.4 ml 10.96 ml/m RA Volume:   19.60 ml  10.04 ml/m LA Vol (A4C):   37.3 ml 19.11 ml/m LA Biplane Vol: 31.1 ml 15.93 ml/m  AORTIC VALVE LVOT Vmax:   117.00 cm/s LVOT Vmean:  67.500 cm/s LVOT VTI:    0.176 m  AORTA Ao Root diam: 2.60 cm MITRAL VALVE               TRICUSPID VALVE MV Area (PHT): 5.42 cm    TR Peak grad:   17.1 mmHg MV Decel Time: 140 msec    TR Vmax:        207.00 cm/s MV E velocity: 68.90 cm/s MV A velocity: 82.00 cm/s  SHUNTS MV E/A ratio:  0.84        Systemic VTI:  0.18 m                            Systemic Diam: 2.00 cm Skeet Latch MD Electronically signed by Skeet Latch MD Signature Date/Time: 11/29/2019/6:13:07 PM    Final    VAS [place] LOWER EXTREMITY VENOUS (DVT)  Result Date: 11/29/2019  Lower Venous DVTStudy Indications: Pulmonary embolism.  Comparison Study: no prior Performing Technologist: Abram Sander  RVS  Examination Guidelines: A complete evaluation includes B-mode imaging, spectral Doppler, color Doppler, and power Doppler as needed of all accessible portions of each vessel. Bilateral testing is considered an integral part of a complete examination. Limited examinations for reoccurring indications may be performed  as noted. The reflux portion of the exam is performed with the patient in reverse Trendelenburg.  +---------+---------------+---------+-----------+----------+--------------+ RIGHT    CompressibilityPhasicitySpontaneityPropertiesThrombus Aging +---------+---------------+---------+-----------+----------+--------------+ CFV      Full           Yes      Yes                                 +---------+---------------+---------+-----------+----------+--------------+ SFJ      Full                                                        +---------+---------------+---------+-----------+----------+--------------+ FV Prox  Full                                                        +---------+---------------+---------+-----------+----------+--------------+ FV Mid   Full                                                        +---------+---------------+---------+-----------+----------+--------------+ FV DistalFull                                                        +---------+---------------+---------+-----------+----------+--------------+ PFV      Full                                                        +---------+---------------+---------+-----------+----------+--------------+ POP      Full           Yes      Yes                                 +---------+---------------+---------+-----------+----------+--------------+ PTV      Full                                                        +---------+---------------+---------+-----------+----------+--------------+ PERO     Full                                                        +---------+---------------+---------+-----------+----------+--------------+   +---------+---------------+---------+-----------+----------+--------------+  LEFT     CompressibilityPhasicitySpontaneityPropertiesThrombus Aging +---------+---------------+---------+-----------+----------+--------------+ CFV      Full            Yes      Yes                                 +---------+---------------+---------+-----------+----------+--------------+ SFJ      Full                                                        +---------+---------------+---------+-----------+----------+--------------+ FV Prox  Full                                                        +---------+---------------+---------+-----------+----------+--------------+ FV Mid   Full                                                        +---------+---------------+---------+-----------+----------+--------------+ FV DistalFull                                                        +---------+---------------+---------+-----------+----------+--------------+ PFV      Full                                                        +---------+---------------+---------+-----------+----------+--------------+ POP      Full           Yes      Yes                                 +---------+---------------+---------+-----------+----------+--------------+ PTV      Full                                                        +---------+---------------+---------+-----------+----------+--------------+ PERO                                                  Not visualized +---------+---------------+---------+-----------+----------+--------------+ Gastroc  None                                         Acute          +---------+---------------+---------+-----------+----------+--------------+  Summary: RIGHT: - There is no evidence of deep vein thrombosis in the lower extremity.  - No cystic structure found in the popliteal fossa.  LEFT: - Findings consistent with acute deep vein thrombosis involving the left gastrocnemius veins. - No cystic structure found in the popliteal fossa.  *See table(s) above for measurements and observations. Electronically signed by Curt Jews MD on 11/29/2019 at 4:25:09 PM.    Final         Scheduled  Meds: . bictegravir-emtricitabine-tenofovir AF  1 tablet Oral Daily  . ferrous sulfate  325 mg Oral BID WC   Continuous Infusions: . sodium chloride 75 mL/hr at 12/01/19 0957  . magnesium sulfate bolus IVPB    . potassium chloride       LOS: 2 days   Time spent= 35 mins    Kortnie Stovall Arsenio Loader, MD [facility]  If 7PM-7AM, please contact night-coverage  12/01/2019, 10:09 AM

## 2019-12-02 DIAGNOSIS — D122 Benign neoplasm of ascending colon: Secondary | ICD-10-CM

## 2019-12-02 DIAGNOSIS — B20 Human immunodeficiency virus [HIV] disease: Secondary | ICD-10-CM

## 2019-12-02 LAB — FOLATE RBC
Folate, Hemolysate: 238 ng/mL
Folate, RBC: 983 ng/mL (ref >498)
Hematocrit: 24.2 % — ABNORMAL LOW (ref 34.0–46.6)

## 2019-12-02 LAB — BASIC METABOLIC PANEL
Anion gap: 8 (ref 5–15)
BUN: <5 mg/dL — ABNORMAL LOW (ref 6–20)
CO2: 22 mmol/L (ref 22–32)
Calcium: 7.8 mg/dL — ABNORMAL LOW (ref 8.9–10.3)
Chloride: 105 mmol/L (ref 98–111)
Creatinine, Ser: 0.57 mg/dL (ref 0.44–1.00)
GFR calc Af Amer: >60 mL/min (ref >60)
GFR calc non Af Amer: >60 mL/min (ref >60)
Glucose, Bld: 97 mg/dL (ref 70–99)
Potassium: 3.2 mmol/L — ABNORMAL LOW (ref 3.5–5.1)
Sodium: 135 mmol/L (ref 135–145)

## 2019-12-02 LAB — CBC
HCT: 24.7 % — ABNORMAL LOW (ref 36.0–46.0)
Hemoglobin: 7.6 g/dL — ABNORMAL LOW (ref 12.0–15.0)
MCH: 23.7 pg — ABNORMAL LOW (ref 26.0–34.0)
MCHC: 30.8 g/dL (ref 30.0–36.0)
MCV: 76.9 fL — ABNORMAL LOW (ref 80.0–100.0)
Platelets: 475 10*3/uL — ABNORMAL HIGH (ref 150–400)
RBC: 3.21 MIL/uL — ABNORMAL LOW (ref 3.87–5.11)
RDW: 15.8 % — ABNORMAL HIGH (ref 11.5–15.5)
WBC: 9.5 10*3/uL (ref 4.0–10.5)
nRBC: 0 % (ref 0.0–0.2)

## 2019-12-02 LAB — HEPARIN LEVEL (UNFRACTIONATED)
Heparin Unfractionated: 0.2 IU/mL — ABNORMAL LOW (ref 0.30–0.70)
Heparin Unfractionated: 0.52 IU/mL (ref 0.30–0.70)

## 2019-12-02 LAB — HIV-1 RNA QUANT-NO REFLEX-BLD
HIV 1 RNA Quant: 6280 copies/mL
LOG10 HIV-1 RNA: 3.798 log10copy/mL

## 2019-12-02 LAB — MAGNESIUM: Magnesium: 2.1 mg/dL (ref 1.7–2.4)

## 2019-12-02 MED ORDER — POTASSIUM CHLORIDE CRYS ER 20 MEQ PO TBCR
40.0000 meq | EXTENDED_RELEASE_TABLET | Freq: Two times a day (BID) | ORAL | Status: AC
  Administered 2019-12-02 (×2): 40 meq via ORAL
  Filled 2019-12-02 (×2): qty 2

## 2019-12-02 NOTE — Progress Notes (Signed)
PROGRESS NOTE    Anita Hoffman  [id number] DOB: Dec 27, 1960 DOA: 11/28/2019 PCP: Patient, No Pcp Per   Brief Narrative:  59 year old with history of tobacco use presented to the hospital with worsening abdominal pain and shortness of breath for 3 weeks.  Also reported of weight loss.  Right upper quadrant ultrasound showed moderate to severe cholelithiasis with gallbladder wall thickening, numerous liver masses.  CT angio showed bilateral PE.  CT abdomen showed large cecal mass.  GI and general surgery were consulted.  Underwent colonoscopy on 5/1 showing obstructive malignant tumor in the cecum area with some polyps.  Biopsy performed.   Assessment & Plan:   Principal Problem:   Bilateral pulmonary embolism (HCC) Active Problems:   Cecal neoplasm   Liver metastases (HCC)   Elevated troponin   RUQ abdominal pain   Adenomatous polyp of ascending colon   Adenomatous polyp of sigmoid colon   Metastatic malignant neoplasm (HCC)  Acute bilateral pulmonary embolism with cor pulmonale Left lower extremity DVT -Continue heparin drip, eventually transition to NOAC-patient prefers Eliquis. -Lower extremity Dopplers-left lower extremity DVT. -Echocardiogram-EF 60-65%, grade 1 DD, normal right ventricles, normal PA pressures -Supportive care, supplemental oxygen as needed  Metastatic cecal mass 5.6 x 4.0 X 4.5 cm/hepatic/mesenteric metastases Unintentional weight loss -Status post colonoscopy 5/1 showing obstructive tumor.  Status post biopsy pending results -Supportive care. -CEA-pending  Microcytic anemia, hemoglobin 8.4 Iron deficiency anemia -Status post 1 dose of IV Feraheme.  Now on p.o. supplements.  Bowel regimen. -B12 and TSH within normal limit, folate pending  HIV, reactive -CD4 count around 500.  Viral load reviewed.  Seen by infectious disease, started on Biktarvy -Hepatitis B-negative  Cholelithiasis with concerns for cholecystitis, on imaging -No surgery  indicated at this time.  Seen by general surgery.  Given her advance diagnosis, I offered her palliative care services as well.  DVT prophylaxis: Heparin drip Code Status: Full code Family Communication: Patient does not want me to speak with the family because of sensitive information regarding malignancy and HIV.  I explained her her care in detail and I also offered when she is ready for me to speak with the family I will be available.  Status is: Inpatient  Remains inpatient appropriate because:Ongoing diagnostic testing needed not appropriate for outpatient work up.  Maintain hospital stay   Dispo: The patient is from: Home              Anticipated d/c is to: Home              Anticipated d/c date is: 2 days              Patient currently is not medically stable to d/c.  Undergoing evaluation for pulmonary embolism complicated by new diagnosis of metastatic cecal mass And microcytic anemia.  Currently remains on heparin drip.  Awaiting biopsy results.  Subjective: Tolerated colonoscopy yesterday, feels okay.  Review of Systems Otherwise negative except as per HPI, including: General: Denies fever, chills, night sweats or unintended weight loss. Resp: Denies cough, wheezing, shortness of breath. Cardiac: Denies chest pain, palpitations, orthopnea, paroxysmal nocturnal dyspnea. GI: Denies abdominal pain, nausea, vomiting, diarrhea or constipation GU: Denies dysuria, frequency, hesitancy or incontinence MS: Denies muscle aches, joint pain or swelling Neuro: Denies headache, neurologic deficits (focal weakness, numbness, tingling), abnormal gait Psych: Denies anxiety, depression, SI/HI/AVH Skin: Denies new rashes or lesions ID: Denies sick contacts, exotic exposures, travel Examination: Constitutional: Not in acute distress Respiratory: Clear to auscultation bilaterally  Cardiovascular: Normal sinus rhythm, no rubs Abdomen: Nontender nondistended good bowel sounds  Musculoskeletal: No edema noted Skin: No rashes seen Neurologic: CN 2-12 grossly intact.  And nonfocal Psychiatric: Normal judgment and insight. Alert and oriented x 3. Normal mood.  Objective: Vitals:   12/01/19 1310 12/01/19 2029 12/02/19 0530 12/02/19 0722  BP: 103/61 126/75 100/68 126/81  Pulse: 88 99 85 90  Resp: (!) 30 (!) 30 (!) 30 (!) 24  Temp:  98.5 F (36.9 C) 98.2 F (36.8 C)   TempSrc:  Oral Oral   SpO2: 100% 100% 100% 96%  Weight:   80.5 kg   Height:        Intake/Output Summary (Last 24 hours) at 12/02/2019 1041 Last data filed at 12/02/2019 0700 Gross per 24 hour  Intake 3103.51 ml  Output -  Net 3103.51 ml   Filed Weights   12/01/19 0455 12/01/19 1018 12/02/19 0530  Weight: 79.6 kg 76.6 kg 80.5 kg     Data Reviewed:   CBC: Recent Labs  Lab 11/28/19 1309 11/29/19 1400 11/30/19 0309 12/01/19 0334 12/02/19 0502  WBC 10.7* 11.0* 9.6 11.0* 9.5  NEUTROABS  --  7.5  --   --   --   HGB 8.6* 8.4* 7.4* 7.4* 7.6*  HCT 28.4* 28.1* 23.8* 24.0* 24.7*  MCV 78.5* 80.7 77.8* 76.9* 76.9*  PLT 467* 464* 441* 510* 123XX123*   Basic Metabolic Panel: Recent Labs  Lab 11/28/19 1309 11/29/19 1400 11/30/19 0309 12/01/19 0334 12/02/19 0502  NA 133* 133* 133* 134* 135  K 3.1* 3.8 3.4* 3.0* 3.2*  CL 94* 97* 100 102 105  CO2 24 21* 24 19* 22  GLUCOSE 129* 100* 104* 91 97  BUN 6 7 6  <5* <5*  CREATININE 0.77 0.61 0.61 0.55 0.57  CALCIUM 8.4* 8.1* 7.6* 7.8* 7.8*  MG  --   --   --  1.8 2.1   GFR: Estimated Creatinine Clearance: 76.3 mL/min (by C-G formula based on SCr of 0.57 mg/dL). Liver Function Tests: Recent Labs  Lab 11/29/19 0732 11/29/19 1400 11/30/19 0309  AST 59* 56* 53*  ALT 24 21 19   ALKPHOS 175* 176* 162*  BILITOT 1.2 1.2 0.9  PROT 7.6 7.2 6.3*  ALBUMIN 2.6* 2.5* 2.1*   Recent Labs  Lab 11/29/19 0732  LIPASE 17   No results for input(s): AMMONIA in the last 168 hours. Coagulation Profile: No results for input(s): INR, PROTIME in the last  168 hours. Cardiac Enzymes: No results for input(s): CKTOTAL, CKMB, CKMBINDEX, TROPONINI in the last 168 hours. BNP (last 3 results) No results for input(s): PROBNP in the last 8760 hours. HbA1C: No results for input(s): HGBA1C in the last 72 hours. CBG: No results for input(s): GLUCAP in the last 168 hours. Lipid Profile: No results for input(s): CHOL, HDL, LDLCALC, TRIG, CHOLHDL, LDLDIRECT in the last 72 hours. Thyroid Function Tests: Recent Labs    11/30/19 1013  TSH 1.261   Anemia Panel: Recent Labs    11/30/19 1013  VITAMINB12 1,351*  FERRITIN 33  TIBC 232*  IRON 19*   Sepsis Labs: No results for input(s): PROCALCITON, LATICACIDVEN in the last 168 hours.  Recent Results (from the past 240 hour(s))  Respiratory Panel by RT PCR (Flu A&B, Covid) - Nasopharyngeal Swab     Status: None   Collection Time: 11/29/19 10:28 AM   Specimen: Nasopharyngeal Swab  Result Value Ref Range Status   SARS Coronavirus 2 by RT PCR NEGATIVE NEGATIVE Final  Comment: (NOTE) SARS-CoV-2 target nucleic acids are NOT DETECTED. The SARS-CoV-2 RNA is generally detectable in upper respiratoy specimens during the acute phase of infection. The lowest concentration of SARS-CoV-2 viral copies this assay can detect is 131 copies/mL. A negative result does not preclude SARS-Cov-2 infection and should not be used as the sole basis for treatment or other patient management decisions. A negative result may occur with  improper specimen collection/handling, submission of specimen other than nasopharyngeal swab, presence of viral mutation(s) within the areas targeted by this assay, and inadequate number of viral copies (<131 copies/mL). A negative result must be combined with clinical observations, patient history, and epidemiological information. The expected result is Negative. Fact Sheet for Patients:  PinkCheek.be Fact Sheet for Healthcare Providers:   GravelBags.it This test is not yet ap proved or cleared by the [place] FDA and  has been authorized for detection and/or diagnosis of SARS-CoV-2 by FDA under an Emergency Use Authorization (EUA). This EUA will remain  in effect (meaning this test can be used) for the duration of the COVID-19 declaration under Section 564(b)(1) of the Act, 21 U.S.C. section 360bbb-3(b)(1), unless the authorization is terminated or revoked sooner.    Influenza A by PCR NEGATIVE NEGATIVE Final   Influenza B by PCR NEGATIVE NEGATIVE Final    Comment: (NOTE) The Xpert Xpress SARS-CoV-2/FLU/RSV assay is intended as an aid in  the diagnosis of influenza from Nasopharyngeal swab specimens and  should not be used as a sole basis for treatment. Nasal washings and  aspirates are unacceptable for Xpert Xpress SARS-CoV-2/FLU/RSV  testing. Fact Sheet for Patients: PinkCheek.be Fact Sheet for Healthcare Providers: GravelBags.it This test is not yet approved or cleared by the [place] FDA and  has been authorized for detection and/or diagnosis of SARS-CoV-2 by  FDA under an Emergency Use Authorization (EUA). This EUA will remain  in effect (meaning this test can be used) for the duration of the  Covid-19 declaration under Section 564(b)(1) of the Act, 21  U.S.C. section 360bbb-3(b)(1), unless the authorization is  terminated or revoked. Performed at [facility], [place] [address] [place], [place] [address]          Radiology Studies: No results found.      Scheduled Meds: . bictegravir-emtricitabine-tenofovir AF  1 tablet Oral Daily  . ferrous sulfate  325 mg Oral BID WC  . potassium chloride  40 mEq Oral BID   Continuous Infusions: . heparin 2,000 Units/hr (12/02/19 0656)     LOS: 3 days   Time spent= 35 mins    Ankit Arsenio Loader, MD [facility]  If 7PM-7AM, please contact  night-coverage  12/02/2019, 10:41 AM

## 2019-12-02 NOTE — Progress Notes (Signed)
Subjective: No new complaints   Antibiotics:  Anti-infectives (From admission, onward)   Start     Dose/Rate Route Frequency Ordered Stop   12/01/19 1000  bictegravir-emtricitabine-tenofovir AF (BIKTARVY) 50-200-25 MG per tablet 1 tablet     1 tablet Oral Daily 12/01/19 0939        Medications: Scheduled Meds: . bictegravir-emtricitabine-tenofovir AF  1 tablet Oral Daily  . ferrous sulfate  325 mg Oral BID WC  . potassium chloride  40 mEq Oral BID   Continuous Infusions: . heparin 2,000 Units/hr (12/02/19 0656)   PRN Meds:.acetaminophen **OR** acetaminophen, HYDROmorphone (DILAUDID) injection, ipratropium-albuterol, ondansetron **OR** ondansetron (ZOFRAN) IV, polyethylene glycol, senna-docusate    Objective: Weight change: -3 kg  Intake/Output Summary (Last 24 hours) at 12/02/2019 1541 Last data filed at 12/02/2019 0700 Gross per 24 hour  Intake 2903.51 ml  Output --  Net 2903.51 ml   Blood pressure 126/81, pulse 90, temperature 98.2 F (36.8 C), temperature source Oral, resp. rate (!) 24, height 5\' 2"  (1.575 m), weight 80.5 kg, SpO2 96 %. Temp:  [98.2 F (36.8 C)-98.5 F (36.9 C)] 98.2 F (36.8 C) (05/02 0530) Pulse Rate:  [85-99] 90 (05/02 0722) Resp:  [24-30] 24 (05/02 0722) BP: (100-126)/(68-81) 126/81 (05/02 0722) SpO2:  [96 %-100 %] 96 % (05/02 0722) Weight:  [80.5 kg] 80.5 kg (05/02 0530)  Physical Exam: General: Alert and awake, oriented x3, not in any acute distress. HEENT: anicteric sclera, EOMI CVS regular rate, normal  Chest: , no wheezing, no respiratory distress Abdomen: soft non-distended,  Extremities: no edema or deformity noted bilaterally Skin: no rashes Neuro: nonfocal  CBC:    BMET Recent Labs    12/01/19 0334 12/02/19 0502  NA 134* 135  K 3.0* 3.2*  CL 102 105  CO2 19* 22  GLUCOSE 91 97  BUN <5* <5*  CREATININE 0.55 0.57  CALCIUM 7.8* 7.8*     Liver Panel  Recent Labs    11/30/19 0309  PROT 6.3*   ALBUMIN 2.1*  AST 53*  ALT 19  ALKPHOS 162*  BILITOT 0.9       Sedimentation Rate No results for input(s): ESRSEDRATE in the last 72 hours. C-Reactive Protein No results for input(s): CRP in the last 72 hours.  Micro Results: Recent Results (from the past 720 hour(s))  Respiratory Panel by RT PCR (Flu A&B, Covid) - Nasopharyngeal Swab     Status: None   Collection Time: 11/29/19 10:28 AM   Specimen: Nasopharyngeal Swab  Result Value Ref Range Status   SARS Coronavirus 2 by RT PCR NEGATIVE NEGATIVE Final    Comment: (NOTE) SARS-CoV-2 target nucleic acids are NOT DETECTED. The SARS-CoV-2 RNA is generally detectable in upper respiratoy specimens during the acute phase of infection. The lowest concentration of SARS-CoV-2 viral copies this assay can detect is 131 copies/mL. A negative result does not preclude SARS-Cov-2 infection and should not be used as the sole basis for treatment or other patient management decisions. A negative result may occur with  improper specimen collection/handling, submission of specimen other than nasopharyngeal swab, presence of viral mutation(s) within the areas targeted by this assay, and inadequate number of viral copies (<131 copies/mL). A negative result must be combined with clinical observations, patient history, and epidemiological information. The expected result is Negative. Fact Sheet for Patients:  PinkCheek.be Fact Sheet for Healthcare Providers:  GravelBags.it This test is not yet ap proved or cleared by the [place] and  has been authorized for detection and/or diagnosis of SARS-CoV-2 by FDA under an Emergency Use Authorization (EUA). This EUA will remain  in effect (meaning this test can be used) for the duration of the COVID-19 declaration under Section 564(b)(1) of the Act, 21 U.S.C. section 360bbb-3(b)(1), unless the authorization is terminated or revoked  sooner.    Influenza A by PCR NEGATIVE NEGATIVE Final   Influenza B by PCR NEGATIVE NEGATIVE Final    Comment: (NOTE) The Xpert Xpress SARS-CoV-2/FLU/RSV assay is intended as an aid in  the diagnosis of influenza from Nasopharyngeal swab specimens and  should not be used as a sole basis for treatment. Nasal washings and  aspirates are unacceptable for Xpert Xpress SARS-CoV-2/FLU/RSV  testing. Fact Sheet for Patients: PinkCheek.be Fact Sheet for Healthcare Providers: GravelBags.it This test is not yet approved or cleared by the [place] FDA and  has been authorized for detection and/or diagnosis of SARS-CoV-2 by  FDA under an Emergency Use Authorization (EUA). This EUA will remain  in effect (meaning this test can be used) for the duration of the  Covid-19 declaration under Section 564(b)(1) of the Act, 21  U.S.C. section 360bbb-3(b)(1), unless the authorization is  terminated or revoked. Performed at [facility], [place] [address], [place], [place] [address]     Studies/Results: No results found.    Assessment/Plan:  INTERVAL HISTORY: sp colonoscopy and biopsy of malignancy   Principal Problem:   Bilateral pulmonary embolism (HCC) Active Problems:   Cecal neoplasm   Liver metastases (HCC)   Elevated troponin   RUQ abdominal pain   Adenomatous polyp of ascending colon   Adenomatous polyp of sigmoid colon   Metastatic malignant neoplasm (HCC)    Anita Hoffman is a 59 y.o. female with  Newly diagnosed HIV, and unfortunately newly diagnosed metastatic colon cancer and PE  #1 HIV disease: healthy CD4 count, tolerating BIKTARVY. This will be least of her problems if she adheres to meds which I believe she will  She will need HMAP and pharma assistance in the interim  #2 Metastatic colon cancer: awaiting bx result and will need help to get care for this clearly. I hope she has favorable genetic  markers and is eligible for trial possibly  #3 PE on anticoagulation     LOS: 3 days   Alcide Evener 12/02/2019, 3:41 PM

## 2019-12-02 NOTE — Progress Notes (Signed)
ANTICOAGULATION CONSULT NOTE - Follow Up Consult  Pharmacy Consult for heparin Indication: pulmonary embolus  No Known Allergies  Patient Measurements: Height: 5\' 2"  (157.5 cm) Weight: 80.5 kg (177 lb 6.4 oz) IBW/kg (Calculated) : 50.1 Heparin Dosing Weight: 72.4 kg  Vital Signs: Temp: 98.2 F (36.8 C) (05/02 0530) Temp Source: Oral (05/02 0530) BP: 126/81 (05/02 0722) Pulse Rate: 90 (05/02 0722)  Labs: Recent Labs    11/30/19 0309 11/30/19 0309 11/30/19 1013 11/30/19 1822 12/01/19 0334 12/01/19 0339 12/02/19 0502 12/02/19 1210  HGB 7.4*   < >  --   --  7.4*  --  7.6*  --   HCT 23.8*  --  24.2*  --  24.0*  --  24.7*  --   PLT 441*  --   --   --  510*  --  475*  --   HEPARINUNFRC 0.24*   < > 0.32   < >  --  0.31 0.20* 0.52  CREATININE 0.61  --   --   --  0.55  --  0.57  --    < > = values in this interval not displayed.    Estimated Creatinine Clearance: 76.3 mL/min (by C-G formula based on SCr of 0.57 mg/dL).   Assessment: 59 yr old female presenting to the ED with CP and abdominal pain was found to have multiple bilateral PE's with right heart strain. 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 Pharmacy was consulted to start IV heparin. Pt was not on anticoagulation PTA. Plans noted for colonoscopy on 5/1 (biopsy of mass).  Heparin level this afternoon came back therapeutic at 0.52, on 2000 units/hr. No s/sx of bleeding or infusion issues. Hgb stable but low at 7.6, plt 475.   Goal of Therapy:  Heparin level 0.3-0.7 units/ml Monitor platelets by anticoagulation protocol: Yes   Plan:  -Continue heparin infusion at 2000 units/hr  -Continue to monitor daily HL, CBC, and for s/sx of bleeding   Antonietta Jewel, PharmD, [place] Pharmacist  Phone: [phone] 12/02/2019 12:58 PM  Please check AMION for all [place] phone numbers After 10:00 PM, call [place] [phone]

## 2019-12-02 NOTE — Progress Notes (Signed)
ANTICOAGULATION CONSULT NOTE - Follow Up Consult  Pharmacy Consult for heparin Indication: pulmonary embolus  Labs: Recent Labs    11/29/19 0757 11/29/19 1400 11/30/19 0309 11/30/19 0309 11/30/19 1013 11/30/19 1822 12/01/19 0334 12/01/19 0339 12/02/19 0502  HGB  --    < > 7.4*   < >  --   --  7.4*  --  7.6*  HCT  --    < > 23.8*  --   --   --  24.0*  --  24.7*  PLT  --    < > 441*  --   --   --  510*  --  475*  HEPARINUNFRC  --    < > 0.24*  --    < > 0.38  --  0.31 0.20*  CREATININE  --    < > 0.61  --   --   --  0.55  --  0.57  TROPONINIHS 25*  --   --   --   --   --   --   --   --    < > = values in this interval not displayed.    Assessment: 59yo female subtherapeutic on heparin after resumed s/p colonoscopy; no gtt issues or signs of bleeding per RN; Hgb low but stable.  Goal of Therapy:  Heparin level 0.3-0.7 units/ml   Plan:  Will increase heparin gtt by 2 units/kg/hr to 2000 units/hr and check level in 6 hours.    Wynona Neat, PharmD, BCPS  12/02/2019,6:36 AM

## 2019-12-02 NOTE — Anesthesia Postprocedure Evaluation (Signed)
Anesthesia Post Note  Patient: Anita Hoffman  Procedure(s) Performed: COLONOSCOPY WITH PROPOFOL (N/A ) BIOPSY SUBMUCOSAL TATTOO INJECTION POLYPECTOMY HEMOSTASIS CLIP PLACEMENT     Patient location during evaluation: Endoscopy Anesthesia Type: MAC Level of consciousness: awake Pain management: pain level controlled Vital Signs Assessment: post-procedure vital signs reviewed and stable Respiratory status: spontaneous breathing Cardiovascular status: stable Postop Assessment: no apparent nausea or vomiting Anesthetic complications: no    Last Vitals:  Vitals:   12/01/19 1310 12/01/19 2029  BP: 103/61 126/75  Pulse: 88 99  Resp: (!) 30 (!) 30  Temp:  36.9 C  SpO2: 100% 100%    Last Pain:  Vitals:   12/01/19 2301  TempSrc:   PainSc: 2                  Laurieann Friddle

## 2019-12-02 NOTE — Progress Notes (Signed)
     [facility] Progress Note  CC:  Cecal mass  Subjective:  Colonoscopy 5/1 showed the following: - Malignant completely obstructing tumor in the cecum. Biopsied. Tattooed. - One 8 mm polyp in the distal ascending colon, removed with a cold snare. Resected and retrieved. - One 12 mm polyp in the distal sigmoid colon, removed with a cold snare. Resected and retrieved. Clips (MR conditional) were placed. - The distal rectum and anal verge are normal on retroflexion view. - There was significant looping of the colon.  Hgb stable at 7.6 grams this AM.  Objective:  Vital signs in last 24 hours: Temp:  [98.2 F (36.8 C)-98.5 F (36.9 C)] 98.2 F (36.8 C) (05/02 0530) Pulse Rate:  [84-99] 90 (05/02 0722) Resp:  [24-31] 24 (05/02 0722) BP: (96-126)/(52-81) 126/81 (05/02 0722) SpO2:  [96 %-100 %] 96 % (05/02 0722) Weight:  [80.5 kg] 80.5 kg (05/02 0530) Last BM Date: 12/01/19 General:  Alert, Well-developed, in NAD Heart:  Regular rate and rhythm; no murmurs Pulm:  CTAB.  No increased WOB. Abdomen:  Soft, non-distended.  BS present.  Non-tender.  Extremities:  Without edema. Neurologic:  Alert and oriented x 4;  grossly normal neurologically. Psych:  Alert and cooperative. Normal mood and affect.  Intake/Output from previous day: 05/01 0701 - 05/02 0700 In: 3103.5 [I.V.:2415.1; IV Piggyback:688.4] Out: -   Lab Results: Recent Labs    11/30/19 0309 11/30/19 1013 12/01/19 0334 12/02/19 0502  WBC 9.6  --  11.0* 9.5  HGB 7.4*  --  7.4* 7.6*  HCT 23.8* 24.2* 24.0* 24.7*  PLT 441*  --  510* 475*   BMET Recent Labs    11/30/19 0309 12/01/19 0334 12/02/19 0502  NA 133* 134* 135  K 3.4* 3.0* 3.2*  CL 100 102 105  CO2 24 19* 22  GLUCOSE 104* 91 97  BUN 6 <5* <5*  CREATININE 0.61 0.55 0.57  CALCIUM 7.6* 7.8* 7.8*   LFT Recent Labs    11/30/19 0309  PROT 6.3*  ALBUMIN 2.1*  AST 53*  ALT 19  ALKPHOS 162*  BILITOT 0.9   Hepatitis Panel Recent  Labs    12/01/19 0334 12/01/19 0339  HEPBSAG  --  NON REACTIVE  HCVAB NON REACTIVE  --    Assessment / Plan: *   Obstructing cecal mass/cancer with mets to mesenteric nodes, liver.  Colonoscopy 5/1.  Biopsies pending.  *    Bilateral PE with right heart strain. LLE DVT  Day 4 IV heparin.  *   Microcytic anemia:  Hgb stable today at 7.6 grams.  Received Feraheme.  *   HIV test positive, new diagnosis.  ID is seeing the patient.  -Surgery is following (not seeing her this weekend but will see her again tomorrow). -Await path results. -Monitor Hgb and transfuse prn.   LOS: 3 days   Laban Emperor. Oasis Goehring  12/02/2019, 12:42 PM

## 2019-12-03 ENCOUNTER — Telehealth: Payer: Self-pay | Admitting: *Deleted

## 2019-12-03 ENCOUNTER — Encounter (HOSPITAL_COMMUNITY): Payer: Self-pay | Admitting: Internal Medicine

## 2019-12-03 DIAGNOSIS — I2699 Other pulmonary embolism without acute cor pulmonale: Secondary | ICD-10-CM

## 2019-12-03 LAB — CD4/CD8 (T-HELPER/T-SUPPRESSOR CELL)
CD4 absolute: 524 /uL (ref 400–1790)
CD4%: 31 % — ABNORMAL LOW (ref 33–65)
CD8 T Cell Abs: 946 /uL (ref 190–1000)
CD8tox: 57 % — ABNORMAL HIGH (ref 12–40)
Ratio: 0.55 — ABNORMAL LOW (ref 1.0–3.0)
Total lymphocyte count: 1674 /uL (ref 1000–4000)

## 2019-12-03 LAB — BASIC METABOLIC PANEL
Anion gap: 8 (ref 5–15)
BUN: <5 mg/dL — ABNORMAL LOW (ref 6–20)
CO2: 24 mmol/L (ref 22–32)
Calcium: 7.7 mg/dL — ABNORMAL LOW (ref 8.9–10.3)
Chloride: 103 mmol/L (ref 98–111)
Creatinine, Ser: 0.55 mg/dL (ref 0.44–1.00)
GFR calc Af Amer: >60 mL/min (ref >60)
GFR calc non Af Amer: >60 mL/min (ref >60)
Glucose, Bld: 99 mg/dL (ref 70–99)
Potassium: 3.5 mmol/L (ref 3.5–5.1)
Sodium: 135 mmol/L (ref 135–145)

## 2019-12-03 LAB — CBC
HCT: 23.2 % — ABNORMAL LOW (ref 36.0–46.0)
Hemoglobin: 7.1 g/dL — ABNORMAL LOW (ref 12.0–15.0)
MCH: 23.4 pg — ABNORMAL LOW (ref 26.0–34.0)
MCHC: 30.6 g/dL (ref 30.0–36.0)
MCV: 76.6 fL — ABNORMAL LOW (ref 80.0–100.0)
Platelets: 422 10*3/uL — ABNORMAL HIGH (ref 150–400)
RBC: 3.03 MIL/uL — ABNORMAL LOW (ref 3.87–5.11)
RDW: 16.1 % — ABNORMAL HIGH (ref 11.5–15.5)
WBC: 9.7 10*3/uL (ref 4.0–10.5)
nRBC: 0.4 % — ABNORMAL HIGH (ref 0.0–0.2)

## 2019-12-03 LAB — HEMOGLOBIN AND HEMATOCRIT, BLOOD
HCT: 27.4 % — ABNORMAL LOW (ref 36.0–46.0)
Hemoglobin: 8.5 g/dL — ABNORMAL LOW (ref 12.0–15.0)

## 2019-12-03 LAB — RPR: RPR Ser Ql: NONREACTIVE

## 2019-12-03 LAB — CEA: CEA: 10513 ng/mL — ABNORMAL HIGH (ref 0.0–4.7)

## 2019-12-03 LAB — PREPARE RBC (CROSSMATCH)

## 2019-12-03 LAB — HEPARIN LEVEL (UNFRACTIONATED): Heparin Unfractionated: 0.35 IU/mL (ref 0.30–0.70)

## 2019-12-03 LAB — MAGNESIUM: Magnesium: 1.8 mg/dL (ref 1.7–2.4)

## 2019-12-03 LAB — ABO/RH: ABO/RH(D): O POS

## 2019-12-03 MED ORDER — SODIUM CHLORIDE 0.9% IV SOLUTION: Freq: Once | INTRAVENOUS | Status: AC

## 2019-12-03 MED ORDER — BIKTARVY 50-200-25 MG PO TABS: 1.0000 | ORAL_TABLET | Freq: Every day | ORAL | 0 refills | Status: DC

## 2019-12-03 MED ORDER — SODIUM CHLORIDE 0.9 % IV SOLN
510.0000 mg | Freq: Once | INTRAVENOUS | Status: AC
  Administered 2019-12-03: 510 mg via INTRAVENOUS
  Filled 2019-12-03: qty 17

## 2019-12-03 MED FILL — BIKTARVY 50-200-25 MG TABS: 50-200-25 | 30 days supply | Qty: 30 | Fill #0

## 2019-12-03 NOTE — Consult Note (Addendum)
[facility]  [phone] [phone]   MEDICAL ONCOLOGY - INITIAL CONSULTATION  Referral MD: Dr. 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  The patient presented to the hospital with right upper quadrant pain and shortness of breath x3 weeks.  CBC on admission showed a WBC of 10.7, hemoglobin 8.6, MCV 78.5, platelets 467,000.  Ferritin was 33, iron 19, TIBC 232, percent saturation 8%.  In the ER she had a right upper quadrant ultrasound which showed moderate to severe cholelithiasis and mild gallbladder wall thickening, numerous liver masses with the largest over the right lobe measuring 7.3 cm containing central cystic/necrotic area.  She had a CT angiogram of the chest and a CT of the abdomen and pelvis which showed multiple bilateral PE, right greater than left with evidence of right heart strain, large cecal mass extending into the cecal valve region and terminal ileum measuring 5.6 x 4.0 x 4.5 cm consistent with a colonic neoplasm, multiple hepatic metastases, mesenteric nodal metastases.  The patient has been started on IV heparin with plans to transition to Eliquis.  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  Biopsy results are pending.  CEA drawn on 12/01/2019 was elevated at [facility].  General surgery consult obtained earlier today and they do not recommend surgery emergently given that she is tolerating her diet and has bowel function.  The patient reports ongoing upper abdominal pain today.  She is not having any nausea or vomiting.  Bowels are moving but stools are more loose.  She reports that she has been having a good appetite but she reports a weight loss of over 100 pounds since March 2021.  She denies headaches and  dizziness.  Denies chest discomfort.  She reports no shortness of breath with exertion.  Denies bleeding such as epistaxis, hemoptysis, hematemesis, hematuria, melena, hematochezia.  No lower extremity edema reported.  Hemoglobin down to 7.1 this morning and receiving 1 unit of PRBCs during my visit.  The patient is single.  She has 5 children.  Denies current alcohol use.  Has remote history of smoking tobacco but quit 2005.  Reports history of lung cancer in her father and ovarian cancer in her maternal grandmother.  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    Past Medical History:  Diagnosis Date  . Former smoker   . Medical history non-contributory   :  Past Surgical History:  Procedure Laterality Date  . TUBAL LIGATION    :  Current Facility-Administered Medications  Medication Dose Route Frequency Provider Last Rate Last Admin  . acetaminophen (TYLENOL) tablet 650 mg  650 mg Oral Q6H PRN Cirigliano, Vito V, DO   650 mg at 12/01/19 2201   Or  . acetaminophen (TYLENOL) suppository 650 mg  650 mg Rectal Q6H PRN Cirigliano, Vito V, DO      . bictegravir-emtricitabine-tenofovir AF (BIKTARVY) 50-200-25 MG per tablet 1 tablet  1 tablet Oral Daily Cirigliano, Vito V, DO   1 tablet at 12/03/19 0901  . ferrous sulfate tablet 325 mg  325 mg Oral BID WC Cirigliano, Vito V, DO   325 mg at 12/03/19 0901  . heparin ADULT infusion 100 units/mL (25000 units/244mL sodium chloride  0.45%)  2,000 Units/hr Intravenous Continuous Laren Everts, RPH 20 mL/hr at 12/03/19 0515 2,000 Units/hr at 12/03/19 0515  . HYDROmorphone (DILAUDID) injection 0.5-1 mg  0.5-1 mg Intravenous Q2H PRN Cirigliano, Vito V, DO      . ipratropium-albuterol (DUONEB) 0.5-2.5 (3) MG/3ML nebulizer solution 3 mL  3 mL Nebulization Q4H PRN Cirigliano, Vito V, DO      . ondansetron (ZOFRAN) tablet 4 mg  4 mg Oral Q6H PRN Cirigliano, Vito V, DO        Or  . ondansetron (ZOFRAN) injection 4 mg  4 mg Intravenous Q6H PRN Cirigliano, Vito V, DO      . polyethylene glycol (MIRALAX / GLYCOLAX) packet 17 g  17 g Oral Daily PRN Cirigliano, Vito V, DO      . senna-docusate (Senokot-S) tablet 2 tablet  2 tablet Oral QHS PRN Cirigliano, Vito V, DO         No Known Allergies:  History reviewed. No pertinent family history.:  Social History   Socioeconomic History  . Marital status: Single    Spouse name: Not on file  . Number of children: Not on file  . Years of education: Not on file  . Highest education level: Not on file  Occupational History  . Not on file  Tobacco Use  . Smoking status: Former Smoker    Packs/day: 0.50    Types: Cigarettes    Quit date: 11/29/2003    Years since quitting: 16.0  . Smokeless tobacco: Never Used  Substance and Sexual Activity  . Alcohol use: Not Currently    Comment: 1990  . Drug use: Yes    Types: Marijuana    Comment: 1990  . Sexual activity: Not Currently  Other Topics Concern  . Not on file  Social History Narrative  . Not on file   Social Determinants of Health   Financial Resource Strain:   . Difficulty of Paying Living Expenses:   Food Insecurity:   . Worried About Charity fundraiser in the Last Year:   . Arboriculturist in the Last Year:   Transportation Needs:   . Film/video editor (Medical):   Marland Kitchen Lack of Transportation (Non-Medical):   Physical Activity:   . Days of Exercise per Week:   . Minutes of Exercise per Session:   Stress:   . Feeling of Stress :   Social Connections:   . Frequency of Communication with Friends and Family:   . Frequency of Social Gatherings with Friends and Family:   . Attends Religious Services:   . Active Member of Clubs or Organizations:   . Attends Archivist Meetings:   Marland Kitchen Marital Status:   Intimate Partner Violence:   . Fear of Current or Ex-Partner:   . Emotionally Abused:   Marland Kitchen Physically Abused:   . Sexually Abused:    :  Review of Systems: A comprehensive 14 point review of systems was negative except as noted in the HPI.  Exam: Patient Vitals for the past 24 hrs:  BP Temp Temp src Pulse Resp SpO2 Weight  12/03/19 1219 -- 98.6 F (37 C) Oral -- -- -- --  12/03/19 1154 98/75 98.5 F (36.9 C) Oral 73 18 96 % --  12/03/19 1112 95/71 98.8 F (37.1 C) Oral (!) 101 -- 97 % --  12/03/19 0906 -- 98.4 F (36.9 C) Oral -- -- -- --  12/03/19 0517 119/76 (!) 100.5 F (38.1 C) Oral 82 18  96 % 79.4 kg    General:  well-nourished in no acute distress.   Eyes:  no scleral icterus.   ENT:  There were no oropharyngeal lesions.   Neck was without thyromegaly.   Lymphatics:  Negative cervical, supraclavicular or axillary adenopathy.   Respiratory: lungs were clear bilaterally without wheezing or crackles.   Cardiovascular:  Regular rate and rhythm, S1/S2, without murmur, rub or gallop.  There was no pedal edema.   GI: Positive bowel sounds, soft, mild upper abdominal distention with tenderness Musculoskeletal:  no spinal tenderness of palpation of vertebral spine. Skin exam was without echymosis, petichae.   Neuro exam was nonfocal. Patient was alert and oriented.  Attention was good.   Language was appropriate.  Mood was normal without depression.  Speech was not pressured.  Thought content was not tangential.     Lab Results  Component Value Date   WBC 9.7 12/03/2019   HGB 7.1 (L) 12/03/2019   HCT 23.2 (L) 12/03/2019   PLT 422 (H) 12/03/2019   GLUCOSE 99 12/03/2019   CHOL 139 10/03/2009   TRIG 86 10/03/2009   HDL 50 10/03/2009   LDLCALC 72 10/03/2009   ALT 19 11/30/2019   AST 53 (H) 11/30/2019   NA 135 12/03/2019   K 3.5 12/03/2019   CL 103 12/03/2019   CREATININE 0.55 12/03/2019   BUN <5 (L) 12/03/2019   CO2 24 12/03/2019    DG Chest 2 View  Result Date: 11/28/2019 CLINICAL DATA:  59 year old female with history of chest and abdominal pain for the past 2 weeks. EXAM: CHEST - 2 VIEW  COMPARISON:  No priors. FINDINGS: Lung volumes are normal. No consolidative airspace disease. No pleural effusions. No pneumothorax. No pulmonary nodule or mass noted. Pulmonary vasculature and the cardiomediastinal silhouette are within normal limits. IMPRESSION: No radiographic evidence of acute cardiopulmonary disease. Electronically Signed   By: Vinnie Langton M.D.   On: 11/28/2019 13:46   CT Angio Chest PE W/Cm &/Or Wo Cm  Result Date: 11/29/2019 CLINICAL DATA:  RIGHT upper quadrant and RIGHT lower chest pain, shortness of breath, lower extremity edema, former smoker EXAM: CT ANGIOGRAPHY CHEST CT ABDOMEN AND PELVIS WITH CONTRAST TECHNIQUE: Multidetector CT imaging of the chest was performed using the standard protocol during bolus administration of intravenous contrast. Multiplanar CT image reconstructions and MIPs were obtained to evaluate the vascular anatomy. Multidetector CT imaging of the abdomen and pelvis was performed using the standard protocol during bolus administration of intravenous contrast. CONTRAST:  131mL OMNIPAQUE IOHEXOL 300 MG/ML SOLN IV. No oral contrast. COMPARISON:  CT abdomen and pelvis 11/22/2006 FINDINGS: CTA CHEST FINDINGS Cardiovascular: Aorta normal caliber without aneurysm or dissection. Pulmonary arteries well opacified. Filling defects are seen within the pulmonary arteries bilaterally RIGHT greater than LEFT consistent with pulmonary emboli. These are greatest in the RIGHT lower lobe but affect all remaining lobes as well. RV/LV ratio = 1.0, elevated, consistent with RIGHT heart strain. Mediastinum/Nodes: Base of cervical region normal appearance. Normal sized axillary nodes bilaterally. No thoracic adenopathy. Esophagus unremarkable. Lungs/Pleura: Linear subsegmental atelectasis LEFT lower lobe. Lungs otherwise clear. No pulmonary infiltrate, pleural effusion, or pneumothorax. Musculoskeletal: No acute osseous findings. Review of the MIP images confirms the above  findings. CT ABDOMEN and PELVIS FINDINGS Hepatobiliary: Multiple poorly defined mass lesions are seen throughout the liver consistent with extensive hepatic metastatic disease. Largest of these measure 6.6 x 4.2 cm lateral segment LEFT lobe image 43, 5.7 x 5.7 cm posterior RIGHT lobe image 34, anterior  RIGHT lobe 7.2 x 5.0 cm image 33, and 7.6 x 7.1 cm anterior RIGHT lobe image 22. Gallbladder unremarkable. Pancreas: Normal appearance Spleen: Normal appearance Adrenals/Urinary Tract: Adrenal glands normal appearance. BILATERAL renal cysts. Kidneys, ureters, and bladder otherwise normal appearance. Stomach/Bowel: Normal appendix. Large mass identified at cecum extending into ileocecal valve region and terminal ileum measuring 5.6 x 4.0 x 4.5 cm consistent with colonic neoplasm. Thickening of terminal ileum without obstruction. Stomach decompressed. Remaining large and small bowel loops unremarkable. Vascular/Lymphatic: Minimal atherosclerotic calcification aorta. Aorta normal caliber. Few pelvic phleboliths. Mesenteric mass with minimal calcification 4.1 x 2.8 cm image 54 likely nodal metastasis. Additional enlarged pathologic node mesentery medial to cecal tip 1.7 x 1.5 cm. Normal sized inguinal lymph nodes. Reproductive: Uterus and adnexa unremarkable Other: Tiny umbilical hernia containing fat. No free air or free fluid. Musculoskeletal: Bones demineralized. Lipoma involving the distal RIGHT iliopsoas muscle/tendon, 3.6 x 2.5 cm x 8.9 cm length. Review of the MIP images confirms the above findings. IMPRESSION: Multiple BILATERAL pulmonary emboli RIGHT greater than LEFT. Positive for acute PE with CT evidence of right heart strain (RV/LV Ratio = 1.0) consistent with at least submassive (intermediate risk) PE; the presence of right heart strain has been associated with an increased risk of morbidity and mortality. Large cecal mass extending into ileocecal valve region and terminal ileum measuring 5.6 x 4.0 x 4.5 cm  consistent with colonic neoplasm. Multiple hepatic metastases. Mesenteric nodal metastases. Aortic Atherosclerosis (ICD10-I70.0). Critical Value/emergent results were called by telephone at the time of interpretation on 11/29/2019 at 12:24 pm to provider DR. ELIZABETH REES , who verbally acknowledged these results. Electronically Signed   By: Lavonia Dana M.D.   On: 11/29/2019 12:27   CT Abdomen Pelvis W Contrast  Result Date: 11/29/2019 CLINICAL DATA:  RIGHT upper quadrant and RIGHT lower chest pain, shortness of breath, lower extremity edema, former smoker EXAM: CT ANGIOGRAPHY CHEST CT ABDOMEN AND PELVIS WITH CONTRAST TECHNIQUE: Multidetector CT imaging of the chest was performed using the standard protocol during bolus administration of intravenous contrast. Multiplanar CT image reconstructions and MIPs were obtained to evaluate the vascular anatomy. Multidetector CT imaging of the abdomen and pelvis was performed using the standard protocol during bolus administration of intravenous contrast. CONTRAST:  190mL OMNIPAQUE IOHEXOL 300 MG/ML SOLN IV. No oral contrast. COMPARISON:  CT abdomen and pelvis 11/22/2006 FINDINGS: CTA CHEST FINDINGS Cardiovascular: Aorta normal caliber without aneurysm or dissection. Pulmonary arteries well opacified. Filling defects are seen within the pulmonary arteries bilaterally RIGHT greater than LEFT consistent with pulmonary emboli. These are greatest in the RIGHT lower lobe but affect all remaining lobes as well. RV/LV ratio = 1.0, elevated, consistent with RIGHT heart strain. Mediastinum/Nodes: Base of cervical region normal appearance. Normal sized axillary nodes bilaterally. No thoracic adenopathy. Esophagus unremarkable. Lungs/Pleura: Linear subsegmental atelectasis LEFT lower lobe. Lungs otherwise clear. No pulmonary infiltrate, pleural effusion, or pneumothorax. Musculoskeletal: No acute osseous findings. Review of the MIP images confirms the above findings. CT ABDOMEN and  PELVIS FINDINGS Hepatobiliary: Multiple poorly defined mass lesions are seen throughout the liver consistent with extensive hepatic metastatic disease. Largest of these measure 6.6 x 4.2 cm lateral segment LEFT lobe image 43, 5.7 x 5.7 cm posterior RIGHT lobe image 34, anterior RIGHT lobe 7.2 x 5.0 cm image 33, and 7.6 x 7.1 cm anterior RIGHT lobe image 22. Gallbladder unremarkable. Pancreas: Normal appearance Spleen: Normal appearance Adrenals/Urinary Tract: Adrenal glands normal appearance. BILATERAL renal cysts. Kidneys, ureters, and bladder otherwise normal appearance.  Stomach/Bowel: Normal appendix. Large mass identified at cecum extending into ileocecal valve region and terminal ileum measuring 5.6 x 4.0 x 4.5 cm consistent with colonic neoplasm. Thickening of terminal ileum without obstruction. Stomach decompressed. Remaining large and small bowel loops unremarkable. Vascular/Lymphatic: Minimal atherosclerotic calcification aorta. Aorta normal caliber. Few pelvic phleboliths. Mesenteric mass with minimal calcification 4.1 x 2.8 cm image 54 likely nodal metastasis. Additional enlarged pathologic node mesentery medial to cecal tip 1.7 x 1.5 cm. Normal sized inguinal lymph nodes. Reproductive: Uterus and adnexa unremarkable Other: Tiny umbilical hernia containing fat. No free air or free fluid. Musculoskeletal: Bones demineralized. Lipoma involving the distal RIGHT iliopsoas muscle/tendon, 3.6 x 2.5 cm x 8.9 cm length. Review of the MIP images confirms the above findings. IMPRESSION: Multiple BILATERAL pulmonary emboli RIGHT greater than LEFT. Positive for acute PE with CT evidence of right heart strain (RV/LV Ratio = 1.0) consistent with at least submassive (intermediate risk) PE; the presence of right heart strain has been associated with an increased risk of morbidity and mortality. Large cecal mass extending into ileocecal valve region and terminal ileum measuring 5.6 x 4.0 x 4.5 cm consistent with colonic  neoplasm. Multiple hepatic metastases. Mesenteric nodal metastases. Aortic Atherosclerosis (ICD10-I70.0). Critical Value/emergent results were called by telephone at the time of interpretation on 11/29/2019 at 12:24 pm to provider DR. ELIZABETH REES , who verbally acknowledged these results. Electronically Signed   By: Lavonia Dana M.D.   On: 11/29/2019 12:27   ECHOCARDIOGRAM COMPLETE  Result Date: 11/29/2019    ECHOCARDIOGRAM REPORT   Patient Name:   Anita Hoffman Date of Exam: 11/29/2019 Medical Rec #:  [id number]         Height:       62.0 in Accession #:    [id number]        Weight:       210.0 lb Date of Birth:  Oct 31, 1960         BSA:          1.952 m Patient Age:    80 years          BP:           133/77 mmHg Patient Gender: F                 HR:           97 bpm. Exam Location:  Inpatient Procedure: 2D Echo Indications:    Pulmonary Embolus I26.99  History:        Patient has no prior history of Echocardiogram examinations.                 Risk Factors:Former Smoker.  Sonographer:    Mikki Santee RDCS (AE) Referring Phys: [id number] Michell Heinrich PAHWANI IMPRESSIONS  1. Left ventricular ejection fraction, by estimation, is 60 to 65%. The left ventricle has normal function. The left ventricle has no regional wall motion abnormalities. There is mild concentric left ventricular hypertrophy. Left ventricular diastolic parameters are consistent with Grade I diastolic dysfunction (impaired relaxation).  2. Right ventricular systolic function is normal. The right ventricular size is normal. There is normal pulmonary artery systolic pressure.  3. The mitral valve is normal in structure. No evidence of mitral valve regurgitation. No evidence of mitral stenosis.  4. The aortic valve is normal in structure. Aortic valve regurgitation is not visualized. No aortic stenosis is present.  5. The inferior vena cava is normal in size with greater than 50% respiratory variability, suggesting right  atrial pressure of 3 mmHg.  FINDINGS  Left Ventricle: Left ventricular ejection fraction, by estimation, is 60 to 65%. The left ventricle has normal function. The left ventricle has no regional wall motion abnormalities. The left ventricular internal cavity size was normal in size. There is  mild concentric left ventricular hypertrophy. Left ventricular diastolic parameters are consistent with Grade I diastolic dysfunction (impaired relaxation). Right Ventricle: The right ventricular size is normal. No increase in right ventricular wall thickness. Right ventricular systolic function is normal. There is normal pulmonary artery systolic pressure. The tricuspid regurgitant velocity is 2.07 m/s, and  with an assumed right atrial pressure of 3 mmHg, the estimated right ventricular systolic pressure is [id number]. Left Atrium: Left atrial size was normal in size. Right Atrium: Right atrial size was normal in size. Pericardium: Trivial pericardial effusion is present. Mitral Valve: The mitral valve is normal in structure. Normal mobility of the mitral valve leaflets. No evidence of mitral valve regurgitation. No evidence of mitral valve stenosis. Tricuspid Valve: The tricuspid valve is normal in structure. Tricuspid valve regurgitation is trivial. No evidence of tricuspid stenosis. Aortic Valve: The aortic valve is normal in structure. Aortic valve regurgitation is not visualized. No aortic stenosis is present. Pulmonic Valve: The pulmonic valve was normal in structure. Pulmonic valve regurgitation is not visualized. No evidence of pulmonic stenosis. Aorta: The aortic root is normal in size and structure. Venous: The inferior vena cava is normal in size with greater than 50% respiratory variability, suggesting right atrial pressure of 3 mmHg. IAS/Shunts: No atrial level shunt detected by color flow Doppler.  LEFT VENTRICLE PLAX 2D LVIDd:         4.07 cm  Diastology LVIDs:         2.36 cm  LV e' lateral:   9.46 cm/s LV PW:         1.28 cm  LV E/e'  lateral: 7.3 LV IVS:        1.01 cm  LV e' medial:    8.16 cm/s LVOT diam:     2.00 cm  LV E/e' medial:  8.4 LV SV:         55 LV SV Index:   28 LVOT Area:     3.14 cm  RIGHT VENTRICLE RV S prime:     18.60 cm/s TAPSE (M-mode): 1.4 cm LEFT ATRIUM             Index       RIGHT ATRIUM           Index LA diam:        3.20 cm 1.64 cm/m  RA Area:     10.60 cm LA Vol (A2C):   21.4 ml 10.96 ml/m RA Volume:   19.60 ml  10.04 ml/m LA Vol (A4C):   37.3 ml 19.11 ml/m LA Biplane Vol: 31.1 ml 15.93 ml/m  AORTIC VALVE LVOT Vmax:   117.00 cm/s LVOT Vmean:  67.500 cm/s LVOT VTI:    0.176 m  AORTA Ao Root diam: 2.60 cm MITRAL VALVE               TRICUSPID VALVE MV Area (PHT): 5.42 cm    TR Peak grad:   17.1 mmHg MV Decel Time: 140 msec    TR Vmax:        207.00 cm/s MV E velocity: 68.90 cm/s MV A velocity: 82.00 cm/s  SHUNTS MV E/A ratio:  0.84        Systemic VTI:  0.18 m                            Systemic Diam: 2.00 cm Skeet Latch MD Electronically signed by Skeet Latch MD Signature Date/Time: 11/29/2019/6:13:07 PM    Final    VAS [place] LOWER EXTREMITY VENOUS (DVT)  Result Date: 11/29/2019  Lower Venous DVTStudy Indications: Pulmonary embolism.  Comparison Study: no prior Performing Technologist: Abram Sander RVS  Examination Guidelines: A complete evaluation includes B-mode imaging, spectral Doppler, color Doppler, and power Doppler as needed of all accessible portions of each vessel. Bilateral testing is considered an integral part of a complete examination. Limited examinations for reoccurring indications may be performed as noted. The reflux portion of the exam is performed with the patient in reverse Trendelenburg.  +---------+---------------+---------+-----------+----------+--------------+ RIGHT    CompressibilityPhasicitySpontaneityPropertiesThrombus Aging +---------+---------------+---------+-----------+----------+--------------+ CFV      Full           Yes      Yes                                  +---------+---------------+---------+-----------+----------+--------------+ SFJ      Full                                                        +---------+---------------+---------+-----------+----------+--------------+ FV Prox  Full                                                        +---------+---------------+---------+-----------+----------+--------------+ FV Mid   Full                                                        +---------+---------------+---------+-----------+----------+--------------+ FV DistalFull                                                        +---------+---------------+---------+-----------+----------+--------------+ PFV      Full                                                        +---------+---------------+---------+-----------+----------+--------------+ POP      Full           Yes      Yes                                 +---------+---------------+---------+-----------+----------+--------------+ PTV      Full                                                        +---------+---------------+---------+-----------+----------+--------------+  PERO     Full                                                        +---------+---------------+---------+-----------+----------+--------------+   +---------+---------------+---------+-----------+----------+--------------+ LEFT     CompressibilityPhasicitySpontaneityPropertiesThrombus Aging +---------+---------------+---------+-----------+----------+--------------+ CFV      Full           Yes      Yes                                 +---------+---------------+---------+-----------+----------+--------------+ SFJ      Full                                                        +---------+---------------+---------+-----------+----------+--------------+ FV Prox  Full                                                         +---------+---------------+---------+-----------+----------+--------------+ FV Mid   Full                                                        +---------+---------------+---------+-----------+----------+--------------+ FV DistalFull                                                        +---------+---------------+---------+-----------+----------+--------------+ PFV      Full                                                        +---------+---------------+---------+-----------+----------+--------------+ POP      Full           Yes      Yes                                 +---------+---------------+---------+-----------+----------+--------------+ PTV      Full                                                        +---------+---------------+---------+-----------+----------+--------------+ PERO  Not visualized +---------+---------------+---------+-----------+----------+--------------+ Gastroc  None                                         Acute          +---------+---------------+---------+-----------+----------+--------------+     Summary: RIGHT: - There is no evidence of deep vein thrombosis in the lower extremity.  - No cystic structure found in the popliteal fossa.  LEFT: - Findings consistent with acute deep vein thrombosis involving the left gastrocnemius veins. - No cystic structure found in the popliteal fossa.  *See table(s) above for measurements and observations. Electronically signed by Curt Jews MD on 11/29/2019 at 4:25:09 PM.    Final    US Abdomen Limited RUQ  Result Date: 11/29/2019 CLINICAL DATA:  Right upper quadrant pain 3 months. EXAM: ULTRASOUND ABDOMEN LIMITED RIGHT UPPER QUADRANT COMPARISON:  05/08/2007 FINDINGS: Gallbladder: Significant cholelithiasis with largest stone measuring 1.1 cm. Gallbladder wall thickening measuring 5.5 mm. Negative sonographic Murphy sign. No adjacent free fluid.  Common bile duct: Diameter: 5.7 mm. Liver: Heterogeneous parenchymal echogenicity with numerous liver masses many of which have a more hyperechoic periphery with more hypoechoic appearance centrally suggesting a somewhat target appearance. There is a large irregular shape cystic lesion with echogenic periphery over the right lobe measuring approximately 7.3 cm. Portal vein is patent on color Doppler imaging with normal direction of blood flow towards the liver. Other: None. IMPRESSION: 1. Moderate to severe cholelithiasis with mild gallbladder wall thickening of 5.5 mm. Recommend clinical correlation as findings could be seen with acute cholecystitis. 2. Numerous liver masses with the largest over the right lobe measuring 7.3 cm containing central cystic/necrotic area. Findings are highly suspicious for metastatic disease. Recommend clinical correlation as further evaluation with chest/abdominal/pelvic CT may be helpful to search for primary malignancy. Electronically Signed   By: Marin Olp M.D.   On: 11/29/2019 09:11     DG Chest 2 View  Result Date: 11/28/2019 CLINICAL DATA:  58 year old female with history of chest and abdominal pain for the past 2 weeks. EXAM: CHEST - 2 VIEW COMPARISON:  No priors. FINDINGS: Lung volumes are normal. No consolidative airspace disease. No pleural effusions. No pneumothorax. No pulmonary nodule or mass noted. Pulmonary vasculature and the cardiomediastinal silhouette are within normal limits. IMPRESSION: No radiographic evidence of acute cardiopulmonary disease. Electronically Signed   By: Vinnie Langton M.D.   On: 11/28/2019 13:46   CT Angio Chest PE W/Cm &/Or Wo Cm  Result Date: 11/29/2019 CLINICAL DATA:  RIGHT upper quadrant and RIGHT lower chest pain, shortness of breath, lower extremity edema, former smoker EXAM: CT ANGIOGRAPHY CHEST CT ABDOMEN AND PELVIS WITH CONTRAST TECHNIQUE: Multidetector CT imaging of the chest was performed using the standard protocol  during bolus administration of intravenous contrast. Multiplanar CT image reconstructions and MIPs were obtained to evaluate the vascular anatomy. Multidetector CT imaging of the abdomen and pelvis was performed using the standard protocol during bolus administration of intravenous contrast. CONTRAST:  149mL OMNIPAQUE IOHEXOL 300 MG/ML SOLN IV. No oral contrast. COMPARISON:  CT abdomen and pelvis 11/22/2006 FINDINGS: CTA CHEST FINDINGS Cardiovascular: Aorta normal caliber without aneurysm or dissection. Pulmonary arteries well opacified. Filling defects are seen within the pulmonary arteries bilaterally RIGHT greater than LEFT consistent with pulmonary emboli. These are greatest in the RIGHT lower lobe but affect all remaining lobes as well. RV/LV ratio = 1.0, elevated, consistent with RIGHT  heart strain. Mediastinum/Nodes: Base of cervical region normal appearance. Normal sized axillary nodes bilaterally. No thoracic adenopathy. Esophagus unremarkable. Lungs/Pleura: Linear subsegmental atelectasis LEFT lower lobe. Lungs otherwise clear. No pulmonary infiltrate, pleural effusion, or pneumothorax. Musculoskeletal: No acute osseous findings. Review of the MIP images confirms the above findings. CT ABDOMEN and PELVIS FINDINGS Hepatobiliary: Multiple poorly defined mass lesions are seen throughout the liver consistent with extensive hepatic metastatic disease. Largest of these measure 6.6 x 4.2 cm lateral segment LEFT lobe image 43, 5.7 x 5.7 cm posterior RIGHT lobe image 34, anterior RIGHT lobe 7.2 x 5.0 cm image 33, and 7.6 x 7.1 cm anterior RIGHT lobe image 22. Gallbladder unremarkable. Pancreas: Normal appearance Spleen: Normal appearance Adrenals/Urinary Tract: Adrenal glands normal appearance. BILATERAL renal cysts. Kidneys, ureters, and bladder otherwise normal appearance. Stomach/Bowel: Normal appendix. Large mass identified at cecum extending into ileocecal valve region and terminal ileum measuring 5.6 x 4.0 x  4.5 cm consistent with colonic neoplasm. Thickening of terminal ileum without obstruction. Stomach decompressed. Remaining large and small bowel loops unremarkable. Vascular/Lymphatic: Minimal atherosclerotic calcification aorta. Aorta normal caliber. Few pelvic phleboliths. Mesenteric mass with minimal calcification 4.1 x 2.8 cm image 54 likely nodal metastasis. Additional enlarged pathologic node mesentery medial to cecal tip 1.7 x 1.5 cm. Normal sized inguinal lymph nodes. Reproductive: Uterus and adnexa unremarkable Other: Tiny umbilical hernia containing fat. No free air or free fluid. Musculoskeletal: Bones demineralized. Lipoma involving the distal RIGHT iliopsoas muscle/tendon, 3.6 x 2.5 cm x 8.9 cm length. Review of the MIP images confirms the above findings. IMPRESSION: Multiple BILATERAL pulmonary emboli RIGHT greater than LEFT. Positive for acute PE with CT evidence of right heart strain (RV/LV Ratio = 1.0) consistent with at least submassive (intermediate risk) PE; the presence of right heart strain has been associated with an increased risk of morbidity and mortality. Large cecal mass extending into ileocecal valve region and terminal ileum measuring 5.6 x 4.0 x 4.5 cm consistent with colonic neoplasm. Multiple hepatic metastases. Mesenteric nodal metastases. Aortic Atherosclerosis (ICD10-I70.0). Critical Value/emergent results were called by telephone at the time of interpretation on 11/29/2019 at 12:24 pm to provider DR. ELIZABETH REES , who verbally acknowledged these results. Electronically Signed   By: Lavonia Dana M.D.   On: 11/29/2019 12:27   CT Abdomen Pelvis W Contrast  Result Date: 11/29/2019 CLINICAL DATA:  RIGHT upper quadrant and RIGHT lower chest pain, shortness of breath, lower extremity edema, former smoker EXAM: CT ANGIOGRAPHY CHEST CT ABDOMEN AND PELVIS WITH CONTRAST TECHNIQUE: Multidetector CT imaging of the chest was performed using the standard protocol during bolus  administration of intravenous contrast. Multiplanar CT image reconstructions and MIPs were obtained to evaluate the vascular anatomy. Multidetector CT imaging of the abdomen and pelvis was performed using the standard protocol during bolus administration of intravenous contrast. CONTRAST:  170mL OMNIPAQUE IOHEXOL 300 MG/ML SOLN IV. No oral contrast. COMPARISON:  CT abdomen and pelvis 11/22/2006 FINDINGS: CTA CHEST FINDINGS Cardiovascular: Aorta normal caliber without aneurysm or dissection. Pulmonary arteries well opacified. Filling defects are seen within the pulmonary arteries bilaterally RIGHT greater than LEFT consistent with pulmonary emboli. These are greatest in the RIGHT lower lobe but affect all remaining lobes as well. RV/LV ratio = 1.0, elevated, consistent with RIGHT heart strain. Mediastinum/Nodes: Base of cervical region normal appearance. Normal sized axillary nodes bilaterally. No thoracic adenopathy. Esophagus unremarkable. Lungs/Pleura: Linear subsegmental atelectasis LEFT lower lobe. Lungs otherwise clear. No pulmonary infiltrate, pleural effusion, or pneumothorax. Musculoskeletal: No acute osseous findings. Review  of the MIP images confirms the above findings. CT ABDOMEN and PELVIS FINDINGS Hepatobiliary: Multiple poorly defined mass lesions are seen throughout the liver consistent with extensive hepatic metastatic disease. Largest of these measure 6.6 x 4.2 cm lateral segment LEFT lobe image 43, 5.7 x 5.7 cm posterior RIGHT lobe image 34, anterior RIGHT lobe 7.2 x 5.0 cm image 33, and 7.6 x 7.1 cm anterior RIGHT lobe image 22. Gallbladder unremarkable. Pancreas: Normal appearance Spleen: Normal appearance Adrenals/Urinary Tract: Adrenal glands normal appearance. BILATERAL renal cysts. Kidneys, ureters, and bladder otherwise normal appearance. Stomach/Bowel: Normal appendix. Large mass identified at cecum extending into ileocecal valve region and terminal ileum measuring 5.6 x 4.0 x 4.5 cm  consistent with colonic neoplasm. Thickening of terminal ileum without obstruction. Stomach decompressed. Remaining large and small bowel loops unremarkable. Vascular/Lymphatic: Minimal atherosclerotic calcification aorta. Aorta normal caliber. Few pelvic phleboliths. Mesenteric mass with minimal calcification 4.1 x 2.8 cm image 54 likely nodal metastasis. Additional enlarged pathologic node mesentery medial to cecal tip 1.7 x 1.5 cm. Normal sized inguinal lymph nodes. Reproductive: Uterus and adnexa unremarkable Other: Tiny umbilical hernia containing fat. No free air or free fluid. Musculoskeletal: Bones demineralized. Lipoma involving the distal RIGHT iliopsoas muscle/tendon, 3.6 x 2.5 cm x 8.9 cm length. Review of the MIP images confirms the above findings. IMPRESSION: Multiple BILATERAL pulmonary emboli RIGHT greater than LEFT. Positive for acute PE with CT evidence of right heart strain (RV/LV Ratio = 1.0) consistent with at least submassive (intermediate risk) PE; the presence of right heart strain has been associated with an increased risk of morbidity and mortality. Large cecal mass extending into ileocecal valve region and terminal ileum measuring 5.6 x 4.0 x 4.5 cm consistent with colonic neoplasm. Multiple hepatic metastases. Mesenteric nodal metastases. Aortic Atherosclerosis (ICD10-I70.0). Critical Value/emergent results were called by telephone at the time of interpretation on 11/29/2019 at 12:24 pm to provider DR. ELIZABETH REES , who verbally acknowledged these results. Electronically Signed   By: Lavonia Dana M.D.   On: 11/29/2019 12:27   ECHOCARDIOGRAM COMPLETE  Result Date: 11/29/2019    ECHOCARDIOGRAM REPORT   Patient Name:   Anita Hoffman Date of Exam: 11/29/2019 Medical Rec #:  [id number]         Height:       62.0 in Accession #:    [id number]        Weight:       210.0 lb Date of Birth:  [birth year]         BSA:          1.952 m Patient Age:    89 years          BP:           133/77 mmHg  Patient Gender: F                 HR:           97 bpm. Exam Location:  Inpatient Procedure: 2D Echo Indications:    Pulmonary Embolus I26.99  History:        Patient has no prior history of Echocardiogram examinations.                 Risk Factors:Former Smoker.  Sonographer:    Mikki Santee RDCS (AE) Referring Phys: [id number] Michell Heinrich PAHWANI IMPRESSIONS  1. Left ventricular ejection fraction, by estimation, is 60 to 65%. The left ventricle has normal function. The left ventricle has no regional wall motion abnormalities. There is  mild concentric left ventricular hypertrophy. Left ventricular diastolic parameters are consistent with Grade I diastolic dysfunction (impaired relaxation).  2. Right ventricular systolic function is normal. The right ventricular size is normal. There is normal pulmonary artery systolic pressure.  3. The mitral valve is normal in structure. No evidence of mitral valve regurgitation. No evidence of mitral stenosis.  4. The aortic valve is normal in structure. Aortic valve regurgitation is not visualized. No aortic stenosis is present.  5. The inferior vena cava is normal in size with greater than 50% respiratory variability, suggesting right atrial pressure of 3 mmHg. FINDINGS  Left Ventricle: Left ventricular ejection fraction, by estimation, is 60 to 65%. The left ventricle has normal function. The left ventricle has no regional wall motion abnormalities. The left ventricular internal cavity size was normal in size. There is  mild concentric left ventricular hypertrophy. Left ventricular diastolic parameters are consistent with Grade I diastolic dysfunction (impaired relaxation). Right Ventricle: The right ventricular size is normal. No increase in right ventricular wall thickness. Right ventricular systolic function is normal. There is normal pulmonary artery systolic pressure. The tricuspid regurgitant velocity is 2.07 m/s, and  with an assumed right atrial pressure of 3 mmHg, the  estimated right ventricular systolic pressure is [id number]. Left Atrium: Left atrial size was normal in size. Right Atrium: Right atrial size was normal in size. Pericardium: Trivial pericardial effusion is present. Mitral Valve: The mitral valve is normal in structure. Normal mobility of the mitral valve leaflets. No evidence of mitral valve regurgitation. No evidence of mitral valve stenosis. Tricuspid Valve: The tricuspid valve is normal in structure. Tricuspid valve regurgitation is trivial. No evidence of tricuspid stenosis. Aortic Valve: The aortic valve is normal in structure. Aortic valve regurgitation is not visualized. No aortic stenosis is present. Pulmonic Valve: The pulmonic valve was normal in structure. Pulmonic valve regurgitation is not visualized. No evidence of pulmonic stenosis. Aorta: The aortic root is normal in size and structure. Venous: The inferior vena cava is normal in size with greater than 50% respiratory variability, suggesting right atrial pressure of 3 mmHg. IAS/Shunts: No atrial level shunt detected by color flow Doppler.  LEFT VENTRICLE PLAX 2D LVIDd:         4.07 cm  Diastology LVIDs:         2.36 cm  LV e' lateral:   9.46 cm/s LV PW:         1.28 cm  LV E/e' lateral: 7.3 LV IVS:        1.01 cm  LV e' medial:    8.16 cm/s LVOT diam:     2.00 cm  LV E/e' medial:  8.4 LV SV:         55 LV SV Index:   28 LVOT Area:     3.14 cm  RIGHT VENTRICLE RV S prime:     18.60 cm/s TAPSE (M-mode): 1.4 cm LEFT ATRIUM             Index       RIGHT ATRIUM           Index LA diam:        3.20 cm 1.64 cm/m  RA Area:     10.60 cm LA Vol (A2C):   21.4 ml 10.96 ml/m RA Volume:   19.60 ml  10.04 ml/m LA Vol (A4C):   37.3 ml 19.11 ml/m LA Biplane Vol: 31.1 ml 15.93 ml/m  AORTIC VALVE LVOT Vmax:   117.00 cm/s LVOT  Vmean:  67.500 cm/s LVOT VTI:    0.176 m  AORTA Ao Root diam: 2.60 cm MITRAL VALVE               TRICUSPID VALVE MV Area (PHT): 5.42 cm    TR Peak grad:   17.1 mmHg MV Decel Time: 140  msec    TR Vmax:        207.00 cm/s MV E velocity: 68.90 cm/s MV A velocity: 82.00 cm/s  SHUNTS MV E/A ratio:  0.84        Systemic VTI:  0.18 m                            Systemic Diam: 2.00 cm Skeet Latch MD Electronically signed by Skeet Latch MD Signature Date/Time: 11/29/2019/6:13:07 PM    Final    VAS [place] LOWER EXTREMITY VENOUS (DVT)  Result Date: 11/29/2019  Lower Venous DVTStudy Indications: Pulmonary embolism.  Comparison Study: no prior Performing Technologist: Abram Sander RVS  Examination Guidelines: A complete evaluation includes B-mode imaging, spectral Doppler, color Doppler, and power Doppler as needed of all accessible portions of each vessel. Bilateral testing is considered an integral part of a complete examination. Limited examinations for reoccurring indications may be performed as noted. The reflux portion of the exam is performed with the patient in reverse Trendelenburg.  +---------+---------------+---------+-----------+----------+--------------+ RIGHT    CompressibilityPhasicitySpontaneityPropertiesThrombus Aging +---------+---------------+---------+-----------+----------+--------------+ CFV      Full           Yes      Yes                                 +---------+---------------+---------+-----------+----------+--------------+ SFJ      Full                                                        +---------+---------------+---------+-----------+----------+--------------+ FV Prox  Full                                                        +---------+---------------+---------+-----------+----------+--------------+ FV Mid   Full                                                        +---------+---------------+---------+-----------+----------+--------------+ FV DistalFull                                                        +---------+---------------+---------+-----------+----------+--------------+ PFV      Full                                                         +---------+---------------+---------+-----------+----------+--------------+  POP      Full           Yes      Yes                                 +---------+---------------+---------+-----------+----------+--------------+ PTV      Full                                                        +---------+---------------+---------+-----------+----------+--------------+ PERO     Full                                                        +---------+---------------+---------+-----------+----------+--------------+   +---------+---------------+---------+-----------+----------+--------------+ LEFT     CompressibilityPhasicitySpontaneityPropertiesThrombus Aging +---------+---------------+---------+-----------+----------+--------------+ CFV      Full           Yes      Yes                                 +---------+---------------+---------+-----------+----------+--------------+ SFJ      Full                                                        +---------+---------------+---------+-----------+----------+--------------+ FV Prox  Full                                                        +---------+---------------+---------+-----------+----------+--------------+ FV Mid   Full                                                        +---------+---------------+---------+-----------+----------+--------------+ FV DistalFull                                                        +---------+---------------+---------+-----------+----------+--------------+ PFV      Full                                                        +---------+---------------+---------+-----------+----------+--------------+ POP      Full           Yes      Yes                                 +---------+---------------+---------+-----------+----------+--------------+  PTV      Full                                                         +---------+---------------+---------+-----------+----------+--------------+ PERO                                                  Not visualized +---------+---------------+---------+-----------+----------+--------------+ Gastroc  None                                         Acute          +---------+---------------+---------+-----------+----------+--------------+     Summary: RIGHT: - There is no evidence of deep vein thrombosis in the lower extremity.  - No cystic structure found in the popliteal fossa.  LEFT: - Findings consistent with acute deep vein thrombosis involving the left gastrocnemius veins. - No cystic structure found in the popliteal fossa.  *See table(s) above for measurements and observations. Electronically signed by Curt Jews MD on 11/29/2019 at 4:25:09 PM.    Final    US Abdomen Limited RUQ  Result Date: 11/29/2019 CLINICAL DATA:  Right upper quadrant pain 3 months. EXAM: ULTRASOUND ABDOMEN LIMITED RIGHT UPPER QUADRANT COMPARISON:  05/08/2007 FINDINGS: Gallbladder: Significant cholelithiasis with largest stone measuring 1.1 cm. Gallbladder wall thickening measuring 5.5 mm. Negative sonographic Murphy sign. No adjacent free fluid. Common bile duct: Diameter: 5.7 mm. Liver: Heterogeneous parenchymal echogenicity with numerous liver masses many of which have a more hyperechoic periphery with more hypoechoic appearance centrally suggesting a somewhat target appearance. There is a large irregular shape cystic lesion with echogenic periphery over the right lobe measuring approximately 7.3 cm. Portal vein is patent on color Doppler imaging with normal direction of blood flow towards the liver. Other: None. IMPRESSION: 1. Moderate to severe cholelithiasis with mild gallbladder wall thickening of 5.5 mm. Recommend clinical correlation as findings could be seen with acute cholecystitis. 2. Numerous liver masses with the largest over the right lobe measuring 7.3 cm containing central  cystic/necrotic area. Findings are highly suspicious for metastatic disease. Recommend clinical correlation as further evaluation with chest/abdominal/pelvic CT may be helpful to search for primary malignancy. Electronically Signed   By: Marin Olp M.D.   On: 11/29/2019 09:11    Pathology: PENDING  Assessment and Plan:  1.  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  Bilateral PE 3.  Newly diagnosed HIV 4.  Iron deficiency anemia  -Imaging results were discussed with the patient.  We discussed that this is highly concerning for metastatic cecal cancer given the imaging finding as well as the elevated CEA.  However, we need to await the biopsy results to confirm the diagnosis.  Once the diagnosis been confirmed, will have a more detailed discussion with the patient regarding her treatment options. -Appreciate consult from general surgery.  They do not recommend urgent surgery as she does not have any obstructive symptoms.  They also recommend liver biopsy to confirm stage IV disease. -Continue heparin drip for now.  Okay to transition to Eliquis or Xarelto prior to discharge. -ID is following  the patient for her newly diagnosed HIV.  She has been started on Biktarvy. -For her anemia, transfuse for hemoglobin less than 7.  We also can consider giving IV iron.  Thank you for this referral.   Mikey Bussing, DNP, AGPCNP-BC, AOCNP  ADDENDUM: I saw and examined Anita Hoffman today.  I agree with the above assessment by Erasmo Downer.  This is a tough problem that we have.  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  With a CEA of 10,000, I cannot imagine that this is anything else but metastatic colon cancer.  Given that she has this new pulmonary embolism, and the thromboembolic disease in the LEFT gastrocnemius vein, I would not think that she was a great surgical candidate right now unless she runs into problems with obstruction.  She is still having bowel movements.  She is  still passing gas.  She seems to be in very good shape.  It is hard to say how much weight she is lost.  She said that she used to weigh over 200 pounds.  She now weighs 175 pounds.  She has a HIV.  This seems to be under very good control given her CD4 count over 500.  I think that we are going to have to rely on systemic chemotherapy for her initially.  Again she seems to be in great shape.  I really am not worried about chemotherapy worsening the HIV.  I know that infectious disease is following her closely.  As far as anticoagulating her as an outpatient, I really believe that she is going to need Lovenox.  Given that she will be on HAART therapy, this probably would affect any of the newer oral anticoagulants that we use.  I realize that Lovenox is an inconvenience.  However, I think this is probably the safest way for [place] to keep her anticoagulated so that these blood clots will resolve.  Clearly, she is hypercoagulable secondary to malignancy.  We will await the final pathology result.  Once we have the final pathology result, then we will talk to Anita Hoffman about the options.  I would keep her on heparin for right now.  I suspect she probably is going to need a Port-A-Cath to be placed.  I do not think that she is going to need any additional biopsies unless the biopsy that she got from gastroenterology and colonoscopy is not adequate.  She has 10 grandchildren.  This is very important for her to try to be available for them as they grow up.  She has a very strong faith.  She asked me to pray with her and I definitely took her up on the opportunity to pray.  It was very uplifting to see her strength.  I appreciate the opportunity of seeing Anita Hoffman.  She is very nice.  Hopefully, we will be able to help with this malignancy that she has.  Lattie Haw, MD  Michaelyn Barter 6:9-10

## 2019-12-03 NOTE — Progress Notes (Signed)
2 Days Post-Op  Subjective: CC: Abdominal pain Patient underwent colonoscopy over the weekend. Reports she is tolerating soft diet, finishing ~75% of trays without n/v. Having some fullness of her upper abdomen as well as pain that is 4/10. She continues to pass flatus. Last BM yesterday, small, loose stool.   Objective: Vital signs in last 24 hours: Temp:  [98.4 F (36.9 C)-100.5 F (38.1 C)] 98.8 F (37.1 C) (05/03 1112) Pulse Rate:  [82-101] 101 (05/03 1112) Resp:  [18] 18 (05/03 0517) BP: (95-119)/(71-76) 95/71 (05/03 1112) SpO2:  [96 %-97 %] 97 % (05/03 1112) Weight:  [79.4 kg] 79.4 kg (05/03 0517) Last BM Date: 12/01/19  Intake/Output from previous day: 05/02 0701 - 05/03 0700 In: 479.6 [I.V.:479.6] Out: -  Intake/Output this shift: Total I/O In: -  Out: 2000 [Urine:2000]  PE: Gen:  Alert, NAD, pleasant Pulm: Normal rate and effort  Abd: Soft, mild upper abdominal distension with mild tenderness, +BS Ext:  No erythema, edema, or tenderness BUE/BLE  Psych: A&Ox3  Skin: no rashes noted, warm and dry   Lab Results:  Recent Labs    12/02/19 0502 12/03/19 0411  WBC 9.5 9.7  HGB 7.6* 7.1*  HCT 24.7* 23.2*  PLT 475* 422*   BMET Recent Labs    12/02/19 0502 12/03/19 0411  NA 135 135  K 3.2* 3.5  CL 105 103  CO2 22 24  GLUCOSE 97 99  BUN <5* <5*  CREATININE 0.57 0.55  CALCIUM 7.8* 7.7*   PT/INR No results for input(s): LABPROT, INR in the last 72 hours. CMP     Component Value Date/Time   NA 135 12/03/2019 0411   K 3.5 12/03/2019 0411   CL 103 12/03/2019 0411   CO2 24 12/03/2019 0411   GLUCOSE 99 12/03/2019 0411   BUN <5 (L) 12/03/2019 0411   CREATININE 0.55 12/03/2019 0411   CALCIUM 7.7 (L) 12/03/2019 0411   PROT 6.3 (L) 11/30/2019 0309   ALBUMIN 2.1 (L) 11/30/2019 0309   AST 53 (H) 11/30/2019 0309   ALT 19 11/30/2019 0309   ALKPHOS 162 (H) 11/30/2019 0309   BILITOT 0.9 11/30/2019 0309   GFRNONAA >60 12/03/2019 0411   GFRAA >60  12/03/2019 0411   Lipase     Component Value Date/Time   LIPASE 17 11/29/2019 0732       Studies/Results: No results found.  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  - Path pending - CEA pending.  - Await oncology recs - Recommend liver bx for further workup - Despite completely obstructing tumor seen on colonoscopy patient is tolerating a diet and having bowel function. No indication for emergency surgery currently. Await further workup and results from pathology.  RUQ pain Cholelithiasis  - While the patient has cholelithiasis and may have a history of biliary colic, I do not think she has cholecystitis. I believe the significant mass lesions of the liver are causing her RUQ pain and her distended gallbladder is a result of underlying liver dysfunction and edema. I do not recommend any  further workup for cholecystitis.   FEN - Soft VTE - SCDs, Heparin drip ID - None   LOS: 4 days    Jillyn Ledger , [facility] 12/03/2019, 11:15 AM Please see Amion for pager number during day hours 7:00am-4:30pm

## 2019-12-03 NOTE — Progress Notes (Signed)
ANTICOAGULATION CONSULT NOTE - Follow Up Consult  Pharmacy Consult for heparin Indication: pulmonary embolus  No Known Allergies  Patient Measurements: Height: 5\' 2"  (157.5 cm) Weight: 79.4 kg (175 lb) IBW/kg (Calculated) : 50.1 Heparin Dosing Weight: 72.4 kg  Vital Signs: Temp: 100.5 F (38.1 C) (05/03 0517) Temp Source: Oral (05/03 0517) BP: 119/76 (05/03 0517) Pulse Rate: 82 (05/03 0517)  Labs: Recent Labs    12/01/19 0334 12/01/19 0339 12/02/19 0502 12/02/19 1210 12/03/19 0411  HGB 7.4*   < > 7.6*  --  7.1*  HCT 24.0*  --  24.7*  --  23.2*  PLT 510*  --  475*  --  422*  HEPARINUNFRC  --    < > 0.20* 0.52 0.35  CREATININE 0.55  --  0.57  --  0.55   < > = values in this interval not displayed.    Estimated Creatinine Clearance: 75.7 mL/min (by C-G formula based on SCr of 0.55 mg/dL).   Assessment: 59 yr old female presenting to the ED with CP and abdominal pain was found to have multiple bilateral PE's with right heart strain. 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 Pharmacy was consulted to start IV heparin. Pt was not on anticoagulation PTA. Plans noted for colonoscopy on 5/1 (biopsy of mass).  Heparin level this morning came back therapeutic at 0.35, on 2000 units/hr. No s/sx of bleeding or infusion issues. Hgb stable but low at 7.1, plt 422 - plan for 1 unit PRBC given lower Hgb.   Goal of Therapy:  Heparin level 0.3-0.7 units/ml Monitor platelets by anticoagulation protocol: Yes   Plan:  -Continue heparin infusion at 2000 units/hr  -Continue to monitor daily HL, CBC, and for s/sx of bleeding   Antonietta Jewel, PharmD, [place] Pharmacist  Phone: [phone] 12/03/2019 7:53 AM  Please check AMION for all [place] phone numbers After 10:00 PM, call [place] [phone]

## 2019-12-03 NOTE — Progress Notes (Signed)
PROGRESS NOTE    Anita Hoffman  [id number] DOB: 1960-09-24 DOA: 11/28/2019 PCP: Patient, No Pcp Per   Brief Narrative:  59 year old with history of tobacco use presented to the hospital with worsening abdominal pain and shortness of breath for 3 weeks.  Also reported of weight loss.  Right upper quadrant ultrasound showed moderate to severe cholelithiasis with gallbladder wall thickening, numerous liver masses.  CT angio showed bilateral PE.  CT abdomen showed large cecal mass.  GI and general surgery were consulted.  Underwent colonoscopy on 5/1 showing obstructive malignant tumor in the cecum area with some polyps.  Biopsy performed.  Pathology results pending.  Surgery and oncology consulted   Assessment & Plan:   Principal Problem:   Bilateral pulmonary embolism (HCC) Active Problems:   Cecal neoplasm   Liver metastases (HCC)   Elevated troponin   RUQ abdominal pain   Adenomatous polyp of ascending colon   Adenomatous polyp of sigmoid colon   Metastatic malignant neoplasm (HCC)   HIV disease (HCC)  Acute bilateral pulmonary embolism with cor pulmonale Left lower extremity DVT -Continue heparin drip, eventually transition to NOAC-patient prefers Eliquis -Lower extremity Dopplers-left lower extremity DVT. -Echocardiogram-EF 60-65%, grade 1 DD, normal right ventricles, normal PA pressures -Supportive care, supplemental oxygen as needed  Metastatic cecal mass 5.6 x 4.0 X 4.5 cm/hepatic/mesenteric metastases Unintentional weight loss -Status post colonoscopy 5/1 showing obstructive tumor.  Biopsy results pending -CEA-pending -Oncology and general surgery following  Microcytic anemia, hemoglobin 8.4 Iron deficiency anemia -Status post 1 dose of IV Feraheme.  Now on p.o. supplements.  Bowel regimen. -B12 and TSH within normal limit, folate-normal  HIV, reactive -CD4 count around 500.  Viral load reviewed.  Seen by infectious disease, started on Biktarvy 5/1 -Hepatitis  B-negative  Cholelithiasis with concerns for cholecystitis, on imaging -No surgery indicated at this time.  Seen by general surgery.  Given her advance diagnosis, I offered her palliative care services as well.  DVT prophylaxis: Heparin drip Code Status: Full code Family Communication: Patient does not want me to speak with the family because of sensitive information regarding malignancy and HIV.  I explained her her care in detail and I also offered when she is ready for me to speak with the family I will be available.  Status is: Inpatient  Remains inpatient appropriate because:Ongoing diagnostic testing needed not appropriate for outpatient work up.  Maintain hospital stay   Dispo: The patient is from: Home              Anticipated d/c is to: Home              Anticipated d/c date is: 2 days              Patient currently is not medically stable to d/c.  Undergoing evaluation for pulmonary embolism complicated by new diagnosis of metastatic cecal mass And microcytic anemia.  Currently remains on heparin drip.  Awaiting biopsy results.  Subjective: No complaints doing well overall.  Appreciate it for all the help she has been provided so far  Review of Systems Otherwise negative except as per HPI, including: General: Denies fever, chills, night sweats or unintended weight loss. Resp: Denies cough, wheezing, shortness of breath. Cardiac: Denies chest pain, palpitations, orthopnea, paroxysmal nocturnal dyspnea. GI: Denies abdominal pain, nausea, vomiting, diarrhea or constipation GU: Denies dysuria, frequency, hesitancy or incontinence MS: Denies muscle aches, joint pain or swelling Neuro: Denies headache, neurologic deficits (focal weakness, numbness, tingling), abnormal gait Psych:  Denies anxiety, depression, SI/HI/AVH Skin: Denies new rashes or lesions ID: Denies sick contacts, exotic exposures, travel  Examination: Constitutional: Not in acute distress Respiratory: Clear  to auscultation bilaterally Cardiovascular: Normal sinus rhythm, no rubs Abdomen: Nontender nondistended good bowel sounds Musculoskeletal: No edema noted Skin: No rashes seen Neurologic: CN 2-12 grossly intact.  And nonfocal Psychiatric: Normal judgment and insight. Alert and oriented x 3. Normal mood.  Objective: Vitals:   12/01/19 2029 12/02/19 0530 12/02/19 0722 12/03/19 0517  BP: 126/75 100/68 126/81 119/76  Pulse: 99 85 90 82  Resp: (!) 30 (!) 30 (!) 24 18  Temp: 98.5 F (36.9 C) 98.2 F (36.8 C)  (!) 100.5 F (38.1 C)  TempSrc: Oral Oral  Oral  SpO2: 100% 100% 96% 96%  Weight:  80.5 kg  79.4 kg  Height:        Intake/Output Summary (Last 24 hours) at 12/03/2019 0849 Last data filed at 12/03/2019 0700 Gross per 24 hour  Intake 479.56 ml  Output --  Net 479.56 ml   Filed Weights   12/01/19 1018 12/02/19 0530 12/03/19 0517  Weight: 76.6 kg 80.5 kg 79.4 kg     Data Reviewed:   CBC: Recent Labs  Lab 11/29/19 1400 11/29/19 1400 11/30/19 0309 11/30/19 1013 12/01/19 0334 12/02/19 0502 12/03/19 0411  WBC 11.0*  --  9.6  --  11.0* 9.5 9.7  NEUTROABS 7.5  --   --   --   --   --   --   HGB 8.4*  --  7.4*  --  7.4* 7.6* 7.1*  HCT 28.1*   < > 23.8* 24.2* 24.0* 24.7* 23.2*  MCV 80.7  --  77.8*  --  76.9* 76.9* 76.6*  PLT 464*  --  441*  --  510* 475* 422*   < > = values in this interval not displayed.   Basic Metabolic Panel: Recent Labs  Lab 11/29/19 1400 11/30/19 0309 12/01/19 0334 12/02/19 0502 12/03/19 0411  NA 133* 133* 134* 135 135  K 3.8 3.4* 3.0* 3.2* 3.5  CL 97* 100 102 105 103  CO2 21* 24 19* 22 24  GLUCOSE 100* 104* 91 97 99  BUN 7 6 <5* <5* <5*  CREATININE 0.61 0.61 0.55 0.57 0.55  CALCIUM 8.1* 7.6* 7.8* 7.8* 7.7*  MG  --   --  1.8 2.1 1.8   GFR: Estimated Creatinine Clearance: 75.7 mL/min (by C-G formula based on SCr of 0.55 mg/dL). Liver Function Tests: Recent Labs  Lab 11/29/19 0732 11/29/19 1400 11/30/19 0309  AST 59* 56* 53*   ALT 24 21 19   ALKPHOS 175* 176* 162*  BILITOT 1.2 1.2 0.9  PROT 7.6 7.2 6.3*  ALBUMIN 2.6* 2.5* 2.1*   Recent Labs  Lab 11/29/19 0732  LIPASE 17   No results for input(s): AMMONIA in the last 168 hours. Coagulation Profile: No results for input(s): INR, PROTIME in the last 168 hours. Cardiac Enzymes: No results for input(s): CKTOTAL, CKMB, CKMBINDEX, TROPONINI in the last 168 hours. BNP (last 3 results) No results for input(s): PROBNP in the last 8760 hours. HbA1C: No results for input(s): HGBA1C in the last 72 hours. CBG: No results for input(s): GLUCAP in the last 168 hours. Lipid Profile: No results for input(s): CHOL, HDL, LDLCALC, TRIG, CHOLHDL, LDLDIRECT in the last 72 hours. Thyroid Function Tests: Recent Labs    11/30/19 1013  TSH 1.261   Anemia Panel: Recent Labs    11/30/19 1013  VITAMINB12 1,351*  FERRITIN 33  TIBC 232*  IRON 19*   Sepsis Labs: No results for input(s): PROCALCITON, LATICACIDVEN in the last 168 hours.  Recent Results (from the past 240 hour(s))  Respiratory Panel by RT PCR (Flu A&B, Covid) - Nasopharyngeal Swab     Status: None   Collection Time: 11/29/19 10:28 AM   Specimen: Nasopharyngeal Swab  Result Value Ref Range Status   SARS Coronavirus 2 by RT PCR NEGATIVE NEGATIVE Final    Comment: (NOTE) SARS-CoV-2 target nucleic acids are NOT DETECTED. The SARS-CoV-2 RNA is generally detectable in upper respiratoy specimens during the acute phase of infection. The lowest concentration of SARS-CoV-2 viral copies this assay can detect is 131 copies/mL. A negative result does not preclude SARS-Cov-2 infection and should not be used as the sole basis for treatment or other patient management decisions. A negative result may occur with  improper specimen collection/handling, submission of specimen other than nasopharyngeal swab, presence of viral mutation(s) within the areas targeted by this assay, and inadequate number of viral  copies (<131 copies/mL). A negative result must be combined with clinical observations, patient history, and epidemiological information. The expected result is Negative. Fact Sheet for Patients:  PinkCheek.be Fact Sheet for Healthcare Providers:  GravelBags.it This test is not yet ap proved or cleared by the [place] FDA and  has been authorized for detection and/or diagnosis of SARS-CoV-2 by FDA under an Emergency Use Authorization (EUA). This EUA will remain  in effect (meaning this test can be used) for the duration of the COVID-19 declaration under Section 564(b)(1) of the Act, 21 U.S.C. section 360bbb-3(b)(1), unless the authorization is terminated or revoked sooner.    Influenza A by PCR NEGATIVE NEGATIVE Final   Influenza B by PCR NEGATIVE NEGATIVE Final    Comment: (NOTE) The Xpert Xpress SARS-CoV-2/FLU/RSV assay is intended as an aid in  the diagnosis of influenza from Nasopharyngeal swab specimens and  should not be used as a sole basis for treatment. Nasal washings and  aspirates are unacceptable for Xpert Xpress SARS-CoV-2/FLU/RSV  testing. Fact Sheet for Patients: PinkCheek.be Fact Sheet for Healthcare Providers: GravelBags.it This test is not yet approved or cleared by the [place] FDA and  has been authorized for detection and/or diagnosis of SARS-CoV-2 by  FDA under an Emergency Use Authorization (EUA). This EUA will remain  in effect (meaning this test can be used) for the duration of the  Covid-19 declaration under Section 564(b)(1) of the Act, 21  U.S.C. section 360bbb-3(b)(1), unless the authorization is  terminated or revoked. Performed at [facility], [place] [address] [place], [place] [address]          Radiology Studies: No results found.      Scheduled Meds: . sodium chloride   Intravenous Once  .  bictegravir-emtricitabine-tenofovir AF  1 tablet Oral Daily  . ferrous sulfate  325 mg Oral BID WC   Continuous Infusions: . heparin 2,000 Units/hr (12/03/19 0515)     LOS: 4 days   Time spent= 35 mins    Scott Fix Arsenio Loader, MD [facility]  If 7PM-7AM, please contact night-coverage  12/03/2019, 8:49 AM

## 2019-12-03 NOTE — Telephone Encounter (Signed)
-----   Message from [place] Callas, NP sent at 11/30/2019  3:52 PM EDT ----- New B20 diagnosis. Patient in the hospital and Dr. Tommy Medal to see her this weekend. She has some other medical limitations that may require hospitalization through early next week or beyond.   Thanks, ladies [place]

## 2019-12-03 NOTE — Progress Notes (Addendum)
ID Pharmacy Progress Note   I spoke with Anita Hoffman today about enrolling in patient assistance for [employer]. She has been approved for a 30 day free supply of Biktarvy from [employer] using the billing information below.    [id number] Group: [id number]  We will make sure that she has this in hand from the [facility] prior to discharge.     Jimmy Footman, PharmD, BCPS, BCIDP Infectious Diseases Clinical Pharmacist Phone: [phone] 12/03/2019 1:42 PM

## 2019-12-03 NOTE — Progress Notes (Signed)
Occupational Therapy Evaluation Patient Details Name: Anita Hoffman MRN: [id number] DOB: Jan 16, 1961 Today's Date: 12/03/2019    History of Present Illness 59 y/o F with a PMH tobacco use and tubal ligation who presented to the ED with a cc epigastric/RUQ abdominal pain along with SOB for the last 3 weeks. Admitted 11/28/19 for treatment of bilateral PE and cholelithiasi and Non-obstructing cecal mass with likely liver metastasis    Clinical Impression   PTA pt lived with her daughter, independent in all ADL, IADL, and mobility tasks. Pt does not ambulate with an AD and reports 0 falls in the last 6 months. Pt currently independent to min guard for self-care and mobility tasks. Pt able to ambulate to/from bathroom and around room with supervision, noting 0 instances of LOB. Pt completed toileting task, simulated tub/shower transfer, and grooming/hygiene at the sink. No signs/symptoms of distress. Educated pt on safety strategies and energy conservation techniques with good understanding. Pt demonstrates decreased strength, endurance, balance, standing tolerance, and activity tolerance impacting ability to complete self-care and functional transfer tasks. Recommend skilled OT services to address above deficits in order to promote function and prevent further decline.     Follow Up Recommendations  No OT follow up    Equipment Recommendations  3 in 1 bedside commode(for use in shower)    Recommendations for Other Services       Precautions / Restrictions Precautions Precautions: None Restrictions Weight Bearing Restrictions: No      Mobility Bed Mobility Overal bed mobility: Modified Independent             General bed mobility comments: HoB elevated and use of bedrail   Transfers Overall transfer level: Modified independent Equipment used: None             General transfer comment: Good balance and safety    Balance Overall balance assessment: Mild deficits  observed, not formally tested                                         ADL either performed or assessed with clinical judgement   ADL Overall ADL's : Needs assistance/impaired Eating/Feeding: Independent;Sitting   Grooming: Wash/dry hands;Wash/dry face;Oral care;Supervision/safety;Standing Grooming Details (indicate cue type and reason): While standing at the sink Upper Body Bathing: Set up;Supervision/ safety;Sitting   Lower Body Bathing: Set up;Supervison/ safety;Sit to/from stand   Upper Body Dressing : Set up;Supervision/safety;Sitting   Lower Body Dressing: Set up;Supervision/safety;Sit to/from stand   Toilet Transfer: Supervision/safety;Ambulation;Regular Toilet   Toileting- Water quality scientist and Hygiene: Supervision/safety;Sit to/from stand   Tub/ Shower Transfer: Tub transfer;Min Chief Executive Officer Details (indicate cue type and reason): Simulated in room. 0 instances of LOB.  Functional mobility during ADLs: Supervision/safety General ADL Comments: Pt able to ambulate around room and to/from bathroom with supervision to ensure safety with line management. 0 instances of LOB.      Vision Baseline Vision/History: Wears glasses Wears Glasses: At all times       Perception     Praxis      Pertinent Vitals/Pain Pain Assessment: No/denies pain     Hand Dominance Right   Extremity/Trunk Assessment Upper Extremity Assessment Upper Extremity Assessment: Generalized weakness   Lower Extremity Assessment Lower Extremity Assessment: Defer to PT evaluation       Communication Communication Communication: No difficulties   Cognition Arousal/Alertness: Awake/alert Behavior During Therapy: WFL for tasks assessed/performed Overall  Cognitive Status: Within Functional Limits for tasks assessed                                 General Comments: A&O x 4. Pt pleasant and willing to participate in therapy. Pt able to follow  multi-step instructions without difficulty.    General Comments  SpO2 maintained in 90s on room air. HR increased to 120s with activity.     Exercises     Shoulder Instructions      Home Living Family/patient expects to be discharged to:: Private residence Living Arrangements: Children(Daughter) Available Help at Discharge: Family;Available PRN/intermittently Type of Home: House Home Access: Level entry     Home Layout: One level     Bathroom Shower/Tub: Teacher, early years/pre: Standard Bathroom Accessibility: Yes   Home Equipment: None          Prior Functioning/Environment Level of Independence: Independent        Comments: Pt independent in all ADL and mobility tasks. Pt does not ambulate with an AD and reports 0 falls in the last 6 months. Pt does not drive. Pt can complete IADLs, however daughter does grocery shopping.         OT Problem List: Decreased strength;Decreased activity tolerance;Impaired balance (sitting and/or standing)      OT Treatment/Interventions: Self-care/ADL training;Therapeutic exercise;Neuromuscular education;Energy conservation;DME and/or AE instruction;Therapeutic activities;Patient/family education;Balance training    OT Goals(Current goals can be found in the care plan section) Acute Rehab OT Goals Patient Stated Goal: to go home Time For Goal Achievement: 12/17/19 Potential to Achieve Goals: Good ADL Goals Pt Will Perform Tub/Shower Transfer: Tub transfer;Independently Additional ADL Goal #1: Pt to complete all ADLs independently with 0 instances of LOB. Additional ADL Goal #2: Pt to complete higher level IADLs (i.e. bed making, item retrieval) with supervision. Additional ADL Goal #3: Pt to tolerate standing up to 10 min independently, in preparation for ADLs. Additional ADL Goal #4: Pt to recall and verbalize 3 energy conservation strategies with 0 verbal cues.  OT Frequency: Min 2X/week   Barriers to D/C:             Co-evaluation              AM-PAC OT "6 Clicks" Daily Activity     Outcome Measure Help from another person eating meals?: None Help from another person taking care of personal grooming?: A Little Help from another person toileting, which includes using toliet, bedpan, or urinal?: A Little Help from another person bathing (including washing, rinsing, drying)?: A Little Help from another person to put on and taking off regular upper body clothing?: A Little Help from another person to put on and taking off regular lower body clothing?: A Little 6 Click Score: 19   End of Session Equipment Utilized During Treatment: Gait belt Nurse Communication: Mobility status  Activity Tolerance: Patient tolerated treatment well Patient left: in chair;with call bell/phone within reach  OT Visit Diagnosis: Unsteadiness on feet (R26.81);Muscle weakness (generalized) (M62.81)                Time: [id number] OT Time Calculation (min): 24 min Charges:  OT General Charges $OT Visit: 1 Visit OT Evaluation $OT Eval Low Complexity: 1 Low OT Treatments $Self Care/Home Management : 8-22 mins  Mauri Brooklyn OTR/L [phone]  Mauri Brooklyn 12/03/2019, 10:27 AM

## 2019-12-03 NOTE — Progress Notes (Signed)
Physical Therapy Treatment Patient Details Name: Anita Hoffman MRN: [id number] DOB: 1961/05/10 Today's Date: 12/03/2019    History of Present Illness 59 y/o F with a PMH tobacco use and tubal ligation who presented to the ED with a cc epigastric/RUQ abdominal pain along with SOB for the last 3 weeks. Admitted 11/28/19 for treatment of bilateral PE and cholelithiasi and Non-obstructing cecal mass with likely liver metastasis     PT Comments    Patient received in bed, agrees to PT session. Patient without complaints or difficulties during session. She is independent with bed mobility and transfers. Ambulates 350-400 feet without ad and normal gait speed and quality. No problems identified at this time. She is safe to walk with NT or mobility tech going forward. She has met PT goals and orders will be completed at this time. If needs arise please feel free to re-consult.        Follow Up Recommendations  No PT follow up     Equipment Recommendations  None recommended by PT    Recommendations for Other Services       Precautions / Restrictions Precautions Precautions: None Precaution Comments: mod fall Restrictions Weight Bearing Restrictions: No    Mobility  Bed Mobility Overal bed mobility: Independent                Transfers Overall transfer level: Independent               General transfer comment: Good balance and safety  Ambulation/Gait Ambulation/Gait assistance: Supervision Gait Distance (Feet): 350 Feet Assistive device: None Gait Pattern/deviations: Step-through pattern;WFL(Within Functional Limits) Gait velocity: WNL   General Gait Details: supervision with ambulation community distances, no difficulty noted or reported. Patient is safe to ambulate with NT assist to manage lines, or independently.   Stairs             Wheelchair Mobility    Modified Rankin (Stroke Patients Only)       Balance Overall balance assessment:  Independent                                          Cognition Arousal/Alertness: Awake/alert Behavior During Therapy: WFL for tasks assessed/performed Overall Cognitive Status: Within Functional Limits for tasks assessed                                 General Comments: A&O x 4. Pt pleasant and willing to participate in therapy. Pt able to follow multi-step instructions without difficulty.       Exercises      General Comments        Pertinent Vitals/Pain Pain Assessment: No/denies pain    Home Living                      Prior Function            PT Goals (current goals can now be found in the care plan section) Acute Rehab PT Goals Patient Stated Goal: to go home PT Goal Formulation: With patient Time For Goal Achievement: 12/14/19 Potential to Achieve Goals: Good Progress towards PT goals: Goals met/education completed, patient discharged from PT    Frequency           PT Plan Discharge plan needs to be updated    Co-evaluation  AM-PAC PT "6 Clicks" Mobility   Outcome Measure  Help needed turning from your back to your side while in a flat bed without using bedrails?: None Help needed moving from lying on your back to sitting on the side of a flat bed without using bedrails?: None Help needed moving to and from a bed to a chair (including a wheelchair)?: None Help needed standing up from a chair using your arms (e.g., wheelchair or bedside chair)?: None Help needed to walk in hospital room?: None Help needed climbing 3-5 steps with a railing? : None 6 Click Score: 24    End of Session   Activity Tolerance: Patient tolerated treatment well Patient left: in bed;with call bell/phone within reach;Other (comment) Nurse Communication: Mobility status       Time: 1405-1430 PT Time Calculation (min) (ACUTE ONLY): 25 min  Charges:  $Gait Training: 23-37 mins                     Jasiel Apachito, PT, GCS 12/03/19,2:45 PM

## 2019-12-03 NOTE — Telephone Encounter (Signed)
Printed demographics and lab results for DIS referral.  Faxed to [phone]. Landis Gandy, RN

## 2019-12-03 NOTE — Progress Notes (Signed)
Patient ID: Anita Hoffman, female   DOB: [birth year], 59 y.o.   MRN: [id number]          [facility]    Date of Admission:  11/28/2019     [name] has newly diagnosed pulmonary embolism and metastatic colon cancer.  She has had incidentally found HIV infection with a relatively low viral load of 6280 and a normal CD4 count 524.  She has been started on Biktarvy and is tolerating it well.  We have been able to arrange approval for a 30-day supply of Biktarvy that she will be given before discharge.  I have arranged follow-up in our clinic with Dr. Tommy Medal on 12/17/2019.  We will sign off now.         Michel Bickers, MD [facility] [phone] pager   [phone] cell 12/03/2019, 3:46 PM

## 2019-12-04 LAB — BPAM RBC
Blood Product Expiration Date: 202105312359
ISSUE DATE / TIME: 202105031152
Unit Type and Rh: 5100

## 2019-12-04 LAB — CBC
HCT: 27.4 % — ABNORMAL LOW (ref 36.0–46.0)
Hemoglobin: 8.6 g/dL — ABNORMAL LOW (ref 12.0–15.0)
MCH: 25 pg — ABNORMAL LOW (ref 26.0–34.0)
MCHC: 31.4 g/dL (ref 30.0–36.0)
MCV: 79.7 fL — ABNORMAL LOW (ref 80.0–100.0)
Platelets: 575 10*3/uL — ABNORMAL HIGH (ref 150–400)
RBC: 3.44 MIL/uL — ABNORMAL LOW (ref 3.87–5.11)
RDW: 17 % — ABNORMAL HIGH (ref 11.5–15.5)
WBC: 9.8 10*3/uL (ref 4.0–10.5)
nRBC: 0 % (ref 0.0–0.2)

## 2019-12-04 LAB — BASIC METABOLIC PANEL
Anion gap: 6 (ref 5–15)
BUN: <5 mg/dL — ABNORMAL LOW (ref 6–20)
CO2: 27 mmol/L (ref 22–32)
Calcium: 8 mg/dL — ABNORMAL LOW (ref 8.9–10.3)
Chloride: 102 mmol/L (ref 98–111)
Creatinine, Ser: 0.57 mg/dL (ref 0.44–1.00)
GFR calc Af Amer: >60 mL/min (ref >60)
GFR calc non Af Amer: >60 mL/min (ref >60)
Glucose, Bld: 104 mg/dL — ABNORMAL HIGH (ref 70–99)
Potassium: 3.4 mmol/L — ABNORMAL LOW (ref 3.5–5.1)
Sodium: 135 mmol/L (ref 135–145)

## 2019-12-04 LAB — TYPE AND SCREEN
ABO/RH(D): O POS
Antibody Screen: NEGATIVE
Unit division: 0

## 2019-12-04 LAB — HEPARIN LEVEL (UNFRACTIONATED): Heparin Unfractionated: 0.39 IU/mL (ref 0.30–0.70)

## 2019-12-04 LAB — MAGNESIUM: Magnesium: 1.8 mg/dL (ref 1.7–2.4)

## 2019-12-04 MED ORDER — POTASSIUM CHLORIDE CRYS ER 20 MEQ PO TBCR
40.0000 meq | EXTENDED_RELEASE_TABLET | Freq: Four times a day (QID) | ORAL | Status: AC
  Administered 2019-12-04 (×2): 40 meq via ORAL
  Filled 2019-12-04 (×2): qty 2

## 2019-12-04 MED ORDER — CEFAZOLIN SODIUM-DEXTROSE 1-4 GM/50ML-% IV SOLN: 1.0000 g | Freq: Once | INTRAVENOUS | Status: DC

## 2019-12-04 MED ORDER — MAGNESIUM SULFATE 2 GM/50ML IV SOLN
2.0000 g | Freq: Once | INTRAVENOUS | Status: AC
  Administered 2019-12-04: 2 g via INTRAVENOUS
  Filled 2019-12-04: qty 50

## 2019-12-04 NOTE — TOC Progression Note (Signed)
Transition of Care [facility]) - Progression Note    Patient Details  Name: Anita Hoffman MRN: [id number] Date of Birth: 06-17-1961  Transition of Care [facility]) CM/SW Contact  Graves-Bigelow, Ocie Cornfield, RN Phone Number: 12/04/2019, 11:41 AM  Clinical Narrative:  Case Manager received verbal permission to call the [facility] to establish an appointment. Appointment will be placed on the AVS. Patient will be able to initially get medications via [place] with [facility] support for Lovenox Injections and then use the onsite pharmacy at the [facility] for additional community resources. Patient may be eligible for assistance via the [facility] once she transitions home. Patient states she will have transportation to appointments. Case Manager will continue to follow for additional transition of care needs.   Expected Discharge Plan: Home/Self Care Barriers to Discharge: Continued Medical Work up  Expected Discharge Plan and Services Expected Discharge Plan: Home/Self Care In-house Referral: NA Discharge Planning Services: CM Consult, Follow-up appt scheduled, [facility], Medication Assistance, [place] will be established.) Post Acute Care Choice: NA Living arrangements for the past 2 months: Single Family Home                 DME Arranged: N/A   Readmission Risk Interventions No flowsheet data found.

## 2019-12-04 NOTE — Progress Notes (Signed)
PROGRESS NOTE    Anita Hoffman  [id number] DOB: Sep 02, 1960 DOA: 11/28/2019 PCP: Patient, No Pcp Per   Brief Narrative:  59 year old with history of tobacco use presented to the hospital with worsening abdominal pain and shortness of breath for 3 weeks.  Also reported of weight loss.  Right upper quadrant ultrasound showed moderate to severe cholelithiasis with gallbladder wall thickening, numerous liver masses.  CT angio showed bilateral PE.  CT abdomen showed large cecal mass.  GI and general surgery were consulted.  Underwent colonoscopy on 5/1 showing obstructive malignant tumor in the cecum area with some polyps.  Biopsy performed eventually resulted invasive adenocarcinoma.  General surgery and oncology consulted.   Assessment & Plan:   Principal Problem:   Bilateral pulmonary embolism (HCC) Active Problems:   Cecal neoplasm   Liver metastases (HCC)   Elevated troponin   RUQ abdominal pain   Adenomatous polyp of ascending colon   Adenomatous polyp of sigmoid colon   Metastatic malignant neoplasm (HCC)   HIV disease (HCC)  Acute bilateral pulmonary embolism with cor pulmonale Left lower extremity DVT -Continue heparin drip, oncology recommending Lovenox at the time of discharge.  Have alerted pharmacy to acquire supplies which should be here tomorrow -Lower extremity Dopplers-left lower extremity DVT. -Echocardiogram-EF 60-65%, grade 1 DD, normal right ventricles, normal PA pressures -Supportive care, supplemental oxygen as needed  Metastatic cecal mass 5.6 x 4.0 X 4.5 cm/hepatic/mesenteric metastases, invasive adenocarcinoma Unintentional weight loss -Status post colonoscopy 5/1 showing obstructive tumor.  Biopsy showed invasive adenocarcinoma.  CEA resulted greater than 10,000. -No urgent surgical plans for the mass as it does not seem to be obstructing.  Patient is passing gas and tolerating p.o.  The day will be available when it becomes necessary -Oncology team  following  Microcytic anemia, hemoglobin 8.4 Iron deficiency anemia -Status post 1 dose of IV Feraheme.  Now on p.o. supplements.  Bowel regimen. -B12 and TSH within normal limit, folate-normal  HIV, reactive -CD4 count around 500.  Viral load reviewed ~6K.  Seen by infectious disease, started on Biktarvy 5/1 -Hepatitis B-negative  Cholelithiasis with concerns for cholecystitis, on imaging -No surgery indicated at this time.  Seen by general surgery.  Given her advance diagnosis, I offered her palliative care services as well.  DVT prophylaxis: Heparin drip Code Status: Full code Family Communication: I have not spoken with family at patient's request because of sensitive information.  Patient states she would inform her family on her own.  I have offered to speak with them with her permission when she is ready to discuss.  Status is: Inpatient  Remains inpatient appropriate because:Ongoing diagnostic testing needed not appropriate for outpatient work up.  Maintain hospital stay   Dispo: The patient is from: Home              Anticipated d/c is to: Home              Anticipated d/c date is: 1 day              Patient currently is not medically stable to d/c.  Still undergoing evaluation from oncology standpoint to determine further plan going forward.  In the meantime awaiting supplies for Lovenox to be delivered which will likely be tomorrow.  Hopefully she can go home tomorrow.  Subjective: Overall feels okay no complaints.  Tolerating oral, passing gas.  Review of Systems Otherwise negative except as per HPI, including: General: Denies fever, chills, night sweats or unintended weight loss.  Resp: Denies cough, wheezing, shortness of breath. Cardiac: Denies chest pain, palpitations, orthopnea, paroxysmal nocturnal dyspnea. GI: Denies abdominal pain, nausea, vomiting, diarrhea or constipation GU: Denies dysuria, frequency, hesitancy or incontinence MS: Denies muscle aches,  joint pain or swelling Neuro: Denies headache, neurologic deficits (focal weakness, numbness, tingling), abnormal gait Psych: Denies anxiety, depression, SI/HI/AVH Skin: Denies new rashes or lesions ID: Denies sick contacts, exotic exposures, travel  Examination: Constitutional: Not in acute distress Respiratory: Clear to auscultation bilaterally Cardiovascular: Normal sinus rhythm, no rubs Abdomen: Nontender nondistended good bowel sounds Musculoskeletal: No edema noted Skin: No rashes seen Neurologic: CN 2-12 grossly intact.  And nonfocal Psychiatric: Normal judgment and insight. Alert and oriented x 3. Normal mood. Objective: Vitals:   12/04/19 0024 12/04/19 0607 12/04/19 0659 12/04/19 0759  BP: 128/72 123/76  109/74  Pulse: 80 86  81  Resp:    18  Temp: 99.9 F (37.7 C) 100.2 F (37.9 C) 99.1 F (37.3 C) 98.3 F (36.8 C)  TempSrc: Oral Oral Oral Oral  SpO2: 100% 97%  97%  Weight:  79.3 kg    Height:        Intake/Output Summary (Last 24 hours) at 12/04/2019 1054 Last data filed at 12/03/2019 1445 Gross per 24 hour  Intake 315 ml  Output --  Net 315 ml   Filed Weights   12/02/19 0530 12/03/19 0517 12/04/19 0607  Weight: 80.5 kg 79.4 kg 79.3 kg     Data Reviewed:   CBC: Recent Labs  Lab 11/29/19 1400 11/29/19 1400 11/30/19 0309 11/30/19 1013 12/01/19 0334 12/02/19 0502 12/03/19 0411 12/03/19 2045 12/04/19 0359  WBC 11.0*   < > 9.6  --  11.0* 9.5 9.7  --  9.8  NEUTROABS 7.5  --   --   --   --   --   --   --   --   HGB 8.4*   < > 7.4*  --  7.4* 7.6* 7.1* 8.5* 8.6*  HCT 28.1*   < > 23.8*   < > 24.0* 24.7* 23.2* 27.4* 27.4*  MCV 80.7   < > 77.8*  --  76.9* 76.9* 76.6*  --  79.7*  PLT 464*   < > 441*  --  510* 475* 422*  --  575*   < > = values in this interval not displayed.   Basic Metabolic Panel: Recent Labs  Lab 11/30/19 0309 12/01/19 0334 12/02/19 0502 12/03/19 0411 12/04/19 0359  NA 133* 134* 135 135 135  K 3.4* 3.0* 3.2* 3.5 3.4*  CL 100  102 105 103 102  CO2 24 19* 22 24 27   GLUCOSE 104* 91 97 99 104*  BUN 6 <5* <5* <5* <5*  CREATININE 0.61 0.55 0.57 0.55 0.57  CALCIUM 7.6* 7.8* 7.8* 7.7* 8.0*  MG  --  1.8 2.1 1.8 1.8   GFR: Estimated Creatinine Clearance: 75.7 mL/min (by C-G formula based on SCr of 0.57 mg/dL). Liver Function Tests: Recent Labs  Lab 11/29/19 0732 11/29/19 1400 11/30/19 0309  AST 59* 56* 53*  ALT 24 21 19   ALKPHOS 175* 176* 162*  BILITOT 1.2 1.2 0.9  PROT 7.6 7.2 6.3*  ALBUMIN 2.6* 2.5* 2.1*   Recent Labs  Lab 11/29/19 0732  LIPASE 17   No results for input(s): AMMONIA in the last 168 hours. Coagulation Profile: No results for input(s): INR, PROTIME in the last 168 hours. Cardiac Enzymes: No results for input(s): CKTOTAL, CKMB, CKMBINDEX, TROPONINI in the last 168 hours. BNP (  last 3 results) No results for input(s): PROBNP in the last 8760 hours. HbA1C: No results for input(s): HGBA1C in the last 72 hours. CBG: No results for input(s): GLUCAP in the last 168 hours. Lipid Profile: No results for input(s): CHOL, HDL, LDLCALC, TRIG, CHOLHDL, LDLDIRECT in the last 72 hours. Thyroid Function Tests: No results for input(s): TSH, T4TOTAL, FREET4, T3FREE, THYROIDAB in the last 72 hours. Anemia Panel: No results for input(s): VITAMINB12, FOLATE, FERRITIN, TIBC, IRON, RETICCTPCT in the last 72 hours. Sepsis Labs: No results for input(s): PROCALCITON, LATICACIDVEN in the last 168 hours.  Recent Results (from the past 240 hour(s))  Respiratory Panel by RT PCR (Flu A&B, Covid) - Nasopharyngeal Swab     Status: None   Collection Time: 11/29/19 10:28 AM   Specimen: Nasopharyngeal Swab  Result Value Ref Range Status   SARS Coronavirus 2 by RT PCR NEGATIVE NEGATIVE Final    Comment: (NOTE) SARS-CoV-2 target nucleic acids are NOT DETECTED. The SARS-CoV-2 RNA is generally detectable in upper respiratoy specimens during the acute phase of infection. The lowest concentration of SARS-CoV-2 viral  copies this assay can detect is 131 copies/mL. A negative result does not preclude SARS-Cov-2 infection and should not be used as the sole basis for treatment or other patient management decisions. A negative result may occur with  improper specimen collection/handling, submission of specimen other than nasopharyngeal swab, presence of viral mutation(s) within the areas targeted by this assay, and inadequate number of viral copies (<131 copies/mL). A negative result must be combined with clinical observations, patient history, and epidemiological information. The expected result is Negative. Fact Sheet for Patients:  PinkCheek.be Fact Sheet for Healthcare Providers:  GravelBags.it This test is not yet ap proved or cleared by the [place] FDA and  has been authorized for detection and/or diagnosis of SARS-CoV-2 by FDA under an Emergency Use Authorization (EUA). This EUA will remain  in effect (meaning this test can be used) for the duration of the COVID-19 declaration under Section 564(b)(1) of the Act, 21 U.S.C. section 360bbb-3(b)(1), unless the authorization is terminated or revoked sooner.    Influenza A by PCR NEGATIVE NEGATIVE Final   Influenza B by PCR NEGATIVE NEGATIVE Final    Comment: (NOTE) The Xpert Xpress SARS-CoV-2/FLU/RSV assay is intended as an aid in  the diagnosis of influenza from Nasopharyngeal swab specimens and  should not be used as a sole basis for treatment. Nasal washings and  aspirates are unacceptable for Xpert Xpress SARS-CoV-2/FLU/RSV  testing. Fact Sheet for Patients: PinkCheek.be Fact Sheet for Healthcare Providers: GravelBags.it This test is not yet approved or cleared by the [place] FDA and  has been authorized for detection and/or diagnosis of SARS-CoV-2 by  FDA under an Emergency Use Authorization (EUA). This EUA will  remain  in effect (meaning this test can be used) for the duration of the  Covid-19 declaration under Section 564(b)(1) of the Act, 21  U.S.C. section 360bbb-3(b)(1), unless the authorization is  terminated or revoked. Performed at [facility], [place] [address], [place], [place] [address]          Radiology Studies: No results found.      Scheduled Meds: . bictegravir-emtricitabine-tenofovir AF  1 tablet Oral Daily  . potassium chloride  40 mEq Oral Q6H   Continuous Infusions: . heparin 2,000 Units/hr (12/04/19 0819)     LOS: 5 days   Time spent= 35 mins    Jackey Housey Arsenio Loader, MD [facility]  If 7PM-7AM,  please contact night-coverage  12/04/2019, 10:54 AM

## 2019-12-04 NOTE — Consult Note (Signed)
Chief Complaint: Chemotherapy access  Referring Physician(s): Dr. Karalee Height  Supervising Physician: Daryll Brod  Patient Status: [facility] - In-pt  History of Present Illness: Anita Hoffman is a 59 y.o. 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 Team is requesting portacath for chemotherapy while the patient is inpatient prior to help prevent a cessation in anticoagulation as outpatient.  Past Medical History:  Diagnosis Date  . Former smoker   . HIV infection ([place])   . Medical history non-contributory     Past Surgical History:  Procedure Laterality Date  . BIOPSY  12/01/2019   Procedure: BIOPSY;  Surgeon: Lavena Bullion, DO;  Location: [facility];  Service: Gastroenterology;;  . COLONOSCOPY WITH PROPOFOL N/A 12/01/2019   Procedure: COLONOSCOPY WITH PROPOFOL;  Surgeon: Lavena Bullion, DO;  Location: [facility];  Service: Gastroenterology;  Laterality: N/A;  . HEMOSTASIS CLIP PLACEMENT  12/01/2019   Procedure: HEMOSTASIS CLIP PLACEMENT;  Surgeon: Lavena Bullion, DO;  Location: [facility];  Service: Gastroenterology;;  . POLYPECTOMY  12/01/2019   Procedure: POLYPECTOMY;  Surgeon: Lavena Bullion, DO;  Location: [facility];  Service: Gastroenterology;;  . SUBMUCOSAL TATTOO INJECTION  12/01/2019   Procedure: SUBMUCOSAL TATTOO INJECTION;  Surgeon: Lavena Bullion, DO;  Location: [facility];  Service: Gastroenterology;;  . TUBAL LIGATION      Allergies: Patient has no known allergies.  Medications: Prior to Admission medications   Medication Sig Start Date End Date Taking? Authorizing Provider  bictegravir-emtricitabine-tenofovir AF (BIKTARVY) 50-200-25 MG TABS tablet Take 1 tablet by mouth daily. 12/03/19   Damita Lack, MD     Family History  Problem Relation Age of Onset  . Ovarian cancer Maternal Grandmother   . Lung cancer  Father     Social History   Socioeconomic History  . Marital status: Single    Spouse name: Not on file  . Number of children: 5  . Years of education: Not on file  . Highest education level: Not on file  Occupational History  . Not on file  Tobacco Use  . Smoking status: Former Smoker    Packs/day: 0.50    Types: Cigarettes    Quit date: 11/29/2003    Years since quitting: 16.0  . Smokeless tobacco: Never Used  Substance and Sexual Activity  . Alcohol use: Not Currently    Comment: 1990  . Drug use: Yes    Types: Marijuana    Comment: 1990  . Sexual activity: Not Currently  Other Topics Concern  . Not on file  Social History Narrative  . Not on file   Social Determinants of Health   Financial Resource Strain:   . Difficulty of Paying Living Expenses:   Food Insecurity:   . Worried About Charity fundraiser in the Last Year:   . Arboriculturist in the Last Year:   Transportation Needs:   . Film/video editor (Medical):   Marland Kitchen Lack of Transportation (Non-Medical):   Physical Activity:   . Days of Exercise per Week:   . Minutes of Exercise per Session:   Stress:   . Feeling of Stress :   Social Connections:   . Frequency of Communication with Friends and Family:   . Frequency of Social Gatherings with Friends and Family:   . Attends Religious Services:   . Active Member of Clubs or Organizations:   .  Attends Archivist Meetings:   Marland Kitchen Marital Status:      Review of Systems: A 12 point ROS discussed and pertinent positives are indicated in the HPI above.  All other systems are negative.  Review of Systems  Constitutional: Negative for fatigue and fever.  HENT: Negative for congestion.   Respiratory: Negative for cough and shortness of breath.   Gastrointestinal: Negative for abdominal pain, diarrhea, nausea and vomiting.  Musculoskeletal: Positive for back pain.    Vital Signs: BP 124/82 (BP Location: Right Arm)   Pulse 73   Temp 98.8 F  (37.1 C) (Oral)   Resp 18   Ht 5\' 2"  (1.575 m)   Wt 174 lb 12.5 oz (79.3 kg)   SpO2 97%   BMI 31.97 kg/m   Physical Exam Vitals and nursing note reviewed.  Constitutional:      Appearance: She is well-developed.  HENT:     Head: Normocephalic and atraumatic.  Eyes:     Conjunctiva/sclera: Conjunctivae normal.  Cardiovascular:     Rate and Rhythm: Normal rate and regular rhythm.     Heart sounds: Normal heart sounds.  Pulmonary:     Effort: Pulmonary effort is normal.     Breath sounds: Normal breath sounds.  Musculoskeletal:     Cervical back: Normal range of motion.  Neurological:     Mental Status: She is alert and oriented to person, place, and time.     Imaging: DG Chest 2 View  Result Date: 11/28/2019 CLINICAL DATA:  59 year old female with history of chest and abdominal pain for the past 2 weeks. EXAM: CHEST - 2 VIEW COMPARISON:  No priors. FINDINGS: Lung volumes are normal. No consolidative airspace disease. No pleural effusions. No pneumothorax. No pulmonary nodule or mass noted. Pulmonary vasculature and the cardiomediastinal silhouette are within normal limits. IMPRESSION: No radiographic evidence of acute cardiopulmonary disease. Electronically Signed   By: Vinnie Langton M.D.   On: 11/28/2019 13:46   CT Angio Chest PE W/Cm &/Or Wo Cm  Result Date: 11/29/2019 CLINICAL DATA:  RIGHT upper quadrant and RIGHT lower chest pain, shortness of breath, lower extremity edema, former smoker EXAM: CT ANGIOGRAPHY CHEST CT ABDOMEN AND PELVIS WITH CONTRAST TECHNIQUE: Multidetector CT imaging of the chest was performed using the standard protocol during bolus administration of intravenous contrast. Multiplanar CT image reconstructions and MIPs were obtained to evaluate the vascular anatomy. Multidetector CT imaging of the abdomen and pelvis was performed using the standard protocol during bolus administration of intravenous contrast. CONTRAST:  164mL OMNIPAQUE IOHEXOL 300 MG/ML SOLN  IV. No oral contrast. COMPARISON:  CT abdomen and pelvis 11/22/2006 FINDINGS: CTA CHEST FINDINGS Cardiovascular: Aorta normal caliber without aneurysm or dissection. Pulmonary arteries well opacified. Filling defects are seen within the pulmonary arteries bilaterally RIGHT greater than LEFT consistent with pulmonary emboli. These are greatest in the RIGHT lower lobe but affect all remaining lobes as well. RV/LV ratio = 1.0, elevated, consistent with RIGHT heart strain. Mediastinum/Nodes: Base of cervical region normal appearance. Normal sized axillary nodes bilaterally. No thoracic adenopathy. Esophagus unremarkable. Lungs/Pleura: Linear subsegmental atelectasis LEFT lower lobe. Lungs otherwise clear. No pulmonary infiltrate, pleural effusion, or pneumothorax. Musculoskeletal: No acute osseous findings. Review of the MIP images confirms the above findings. CT ABDOMEN and PELVIS FINDINGS Hepatobiliary: Multiple poorly defined mass lesions are seen throughout the liver consistent with extensive hepatic metastatic disease. Largest of these measure 6.6 x 4.2 cm lateral segment LEFT lobe image 43, 5.7 x 5.7 cm posterior RIGHT  lobe image 34, anterior RIGHT lobe 7.2 x 5.0 cm image 33, and 7.6 x 7.1 cm anterior RIGHT lobe image 22. Gallbladder unremarkable. Pancreas: Normal appearance Spleen: Normal appearance Adrenals/Urinary Tract: Adrenal glands normal appearance. BILATERAL renal cysts. Kidneys, ureters, and bladder otherwise normal appearance. Stomach/Bowel: Normal appendix. Large mass identified at cecum extending into ileocecal valve region and terminal ileum measuring 5.6 x 4.0 x 4.5 cm consistent with colonic neoplasm. Thickening of terminal ileum without obstruction. Stomach decompressed. Remaining large and small bowel loops unremarkable. Vascular/Lymphatic: Minimal atherosclerotic calcification aorta. Aorta normal caliber. Few pelvic phleboliths. Mesenteric mass with minimal calcification 4.1 x 2.8 cm image 54  likely nodal metastasis. Additional enlarged pathologic node mesentery medial to cecal tip 1.7 x 1.5 cm. Normal sized inguinal lymph nodes. Reproductive: Uterus and adnexa unremarkable Other: Tiny umbilical hernia containing fat. No free air or free fluid. Musculoskeletal: Bones demineralized. Lipoma involving the distal RIGHT iliopsoas muscle/tendon, 3.6 x 2.5 cm x 8.9 cm length. Review of the MIP images confirms the above findings. IMPRESSION: Multiple BILATERAL pulmonary emboli RIGHT greater than LEFT. Positive for acute PE with CT evidence of right heart strain (RV/LV Ratio = 1.0) consistent with at least submassive (intermediate risk) PE; the presence of right heart strain has been associated with an increased risk of morbidity and mortality. Large cecal mass extending into ileocecal valve region and terminal ileum measuring 5.6 x 4.0 x 4.5 cm consistent with colonic neoplasm. Multiple hepatic metastases. Mesenteric nodal metastases. Aortic Atherosclerosis (ICD10-I70.0). Critical Value/emergent results were called by telephone at the time of interpretation on 11/29/2019 at 12:24 pm to provider DR. ELIZABETH REES , who verbally acknowledged these results. Electronically Signed   By: Lavonia Dana M.D.   On: 11/29/2019 12:27   CT Abdomen Pelvis W Contrast  Result Date: 11/29/2019 CLINICAL DATA:  RIGHT upper quadrant and RIGHT lower chest pain, shortness of breath, lower extremity edema, former smoker EXAM: CT ANGIOGRAPHY CHEST CT ABDOMEN AND PELVIS WITH CONTRAST TECHNIQUE: Multidetector CT imaging of the chest was performed using the standard protocol during bolus administration of intravenous contrast. Multiplanar CT image reconstructions and MIPs were obtained to evaluate the vascular anatomy. Multidetector CT imaging of the abdomen and pelvis was performed using the standard protocol during bolus administration of intravenous contrast. CONTRAST:  162mL OMNIPAQUE IOHEXOL 300 MG/ML SOLN IV. No oral contrast.  COMPARISON:  CT abdomen and pelvis 11/22/2006 FINDINGS: CTA CHEST FINDINGS Cardiovascular: Aorta normal caliber without aneurysm or dissection. Pulmonary arteries well opacified. Filling defects are seen within the pulmonary arteries bilaterally RIGHT greater than LEFT consistent with pulmonary emboli. These are greatest in the RIGHT lower lobe but affect all remaining lobes as well. RV/LV ratio = 1.0, elevated, consistent with RIGHT heart strain. Mediastinum/Nodes: Base of cervical region normal appearance. Normal sized axillary nodes bilaterally. No thoracic adenopathy. Esophagus unremarkable. Lungs/Pleura: Linear subsegmental atelectasis LEFT lower lobe. Lungs otherwise clear. No pulmonary infiltrate, pleural effusion, or pneumothorax. Musculoskeletal: No acute osseous findings. Review of the MIP images confirms the above findings. CT ABDOMEN and PELVIS FINDINGS Hepatobiliary: Multiple poorly defined mass lesions are seen throughout the liver consistent with extensive hepatic metastatic disease. Largest of these measure 6.6 x 4.2 cm lateral segment LEFT lobe image 43, 5.7 x 5.7 cm posterior RIGHT lobe image 34, anterior RIGHT lobe 7.2 x 5.0 cm image 33, and 7.6 x 7.1 cm anterior RIGHT lobe image 22. Gallbladder unremarkable. Pancreas: Normal appearance Spleen: Normal appearance Adrenals/Urinary Tract: Adrenal glands normal appearance. BILATERAL renal cysts. Kidneys, ureters, and  bladder otherwise normal appearance. Stomach/Bowel: Normal appendix. Large mass identified at cecum extending into ileocecal valve region and terminal ileum measuring 5.6 x 4.0 x 4.5 cm consistent with colonic neoplasm. Thickening of terminal ileum without obstruction. Stomach decompressed. Remaining large and small bowel loops unremarkable. Vascular/Lymphatic: Minimal atherosclerotic calcification aorta. Aorta normal caliber. Few pelvic phleboliths. Mesenteric mass with minimal calcification 4.1 x 2.8 cm image 54 likely nodal metastasis.  Additional enlarged pathologic node mesentery medial to cecal tip 1.7 x 1.5 cm. Normal sized inguinal lymph nodes. Reproductive: Uterus and adnexa unremarkable Other: Tiny umbilical hernia containing fat. No free air or free fluid. Musculoskeletal: Bones demineralized. Lipoma involving the distal RIGHT iliopsoas muscle/tendon, 3.6 x 2.5 cm x 8.9 cm length. Review of the MIP images confirms the above findings. IMPRESSION: Multiple BILATERAL pulmonary emboli RIGHT greater than LEFT. Positive for acute PE with CT evidence of right heart strain (RV/LV Ratio = 1.0) consistent with at least submassive (intermediate risk) PE; the presence of right heart strain has been associated with an increased risk of morbidity and mortality. Large cecal mass extending into ileocecal valve region and terminal ileum measuring 5.6 x 4.0 x 4.5 cm consistent with colonic neoplasm. Multiple hepatic metastases. Mesenteric nodal metastases. Aortic Atherosclerosis (ICD10-I70.0). Critical Value/emergent results were called by telephone at the time of interpretation on 11/29/2019 at 12:24 pm to provider DR. ELIZABETH REES , who verbally acknowledged these results. Electronically Signed   By: Lavonia Dana M.D.   On: 11/29/2019 12:27   ECHOCARDIOGRAM COMPLETE  Result Date: 11/29/2019    ECHOCARDIOGRAM REPORT   Patient Name:   Anita Hoffman Date of Exam: 11/29/2019 Medical Rec #:  [id number]         Height:       62.0 in Accession #:    [id number]        Weight:       210.0 lb Date of Birth:  [birth year]         BSA:          1.952 m Patient Age:    80 years          BP:           133/77 mmHg Patient Gender: F                 HR:           97 bpm. Exam Location:  Inpatient Procedure: 2D Echo Indications:    Pulmonary Embolus I26.99  History:        Patient has no prior history of Echocardiogram examinations.                 Risk Factors:Former Smoker.  Sonographer:    Mikki Santee RDCS (AE) Referring Phys: [id number] Michell Heinrich PAHWANI  IMPRESSIONS  1. Left ventricular ejection fraction, by estimation, is 60 to 65%. The left ventricle has normal function. The left ventricle has no regional wall motion abnormalities. There is mild concentric left ventricular hypertrophy. Left ventricular diastolic parameters are consistent with Grade I diastolic dysfunction (impaired relaxation).  2. Right ventricular systolic function is normal. The right ventricular size is normal. There is normal pulmonary artery systolic pressure.  3. The mitral valve is normal in structure. No evidence of mitral valve regurgitation. No evidence of mitral stenosis.  4. The aortic valve is normal in structure. Aortic valve regurgitation is not visualized. No aortic stenosis is present.  5. The inferior vena cava is normal in size with greater than  50% respiratory variability, suggesting right atrial pressure of 3 mmHg. FINDINGS  Left Ventricle: Left ventricular ejection fraction, by estimation, is 60 to 65%. The left ventricle has normal function. The left ventricle has no regional wall motion abnormalities. The left ventricular internal cavity size was normal in size. There is  mild concentric left ventricular hypertrophy. Left ventricular diastolic parameters are consistent with Grade I diastolic dysfunction (impaired relaxation). Right Ventricle: The right ventricular size is normal. No increase in right ventricular wall thickness. Right ventricular systolic function is normal. There is normal pulmonary artery systolic pressure. The tricuspid regurgitant velocity is 2.07 m/s, and  with an assumed right atrial pressure of 3 mmHg, the estimated right ventricular systolic pressure is [id number] mmHg. Left Atrium: Left atrial size was normal in size. Right Atrium: Right atrial size was normal in size. Pericardium: Trivial pericardial effusion is present. Mitral Valve: The mitral valve is normal in structure. Normal mobility of the mitral valve leaflets. No evidence of mitral valve  regurgitation. No evidence of mitral valve stenosis. Tricuspid Valve: The tricuspid valve is normal in structure. Tricuspid valve regurgitation is trivial. No evidence of tricuspid stenosis. Aortic Valve: The aortic valve is normal in structure. Aortic valve regurgitation is not visualized. No aortic stenosis is present. Pulmonic Valve: The pulmonic valve was normal in structure. Pulmonic valve regurgitation is not visualized. No evidence of pulmonic stenosis. Aorta: The aortic root is normal in size and structure. Venous: The inferior vena cava is normal in size with greater than 50% respiratory variability, suggesting right atrial pressure of 3 mmHg. IAS/Shunts: No atrial level shunt detected by color flow Doppler.  LEFT VENTRICLE PLAX 2D LVIDd:         4.07 cm  Diastology LVIDs:         2.36 cm  LV e' lateral:   9.46 cm/s LV PW:         1.28 cm  LV E/e' lateral: 7.3 LV IVS:        1.01 cm  LV e' medial:    8.16 cm/s LVOT diam:     2.00 cm  LV E/e' medial:  8.4 LV SV:         55 LV SV Index:   28 LVOT Area:     3.14 cm  RIGHT VENTRICLE RV S prime:     18.60 cm/s TAPSE (M-mode): 1.4 cm LEFT ATRIUM             Index       RIGHT ATRIUM           Index LA diam:        3.20 cm 1.64 cm/m  RA Area:     10.60 cm LA Vol (A2C):   21.4 ml 10.96 ml/m RA Volume:   19.60 ml  10.04 ml/m LA Vol (A4C):   37.3 ml 19.11 ml/m LA Biplane Vol: 31.1 ml 15.93 ml/m  AORTIC VALVE LVOT Vmax:   117.00 cm/s LVOT Vmean:  67.500 cm/s LVOT VTI:    0.176 m  AORTA Ao Root diam: 2.60 cm MITRAL VALVE               TRICUSPID VALVE MV Area (PHT): 5.42 cm    TR Peak grad:   17.1 mmHg MV Decel Time: 140 msec    TR Vmax:        207.00 cm/s MV E velocity: 68.90 cm/s MV A velocity: 82.00 cm/s  SHUNTS MV E/A ratio:  0.84  Systemic VTI:  0.18 m                            Systemic Diam: 2.00 cm Skeet Latch MD Electronically signed by Skeet Latch MD Signature Date/Time: 11/29/2019/6:13:07 PM    Final    VAS [place] LOWER EXTREMITY VENOUS  (DVT)  Result Date: 11/29/2019  Lower Venous DVTStudy Indications: Pulmonary embolism.  Comparison Study: no prior Performing Technologist: Abram Sander RVS  Examination Guidelines: A complete evaluation includes B-mode imaging, spectral Doppler, color Doppler, and power Doppler as needed of all accessible portions of each vessel. Bilateral testing is considered an integral part of a complete examination. Limited examinations for reoccurring indications may be performed as noted. The reflux portion of the exam is performed with the patient in reverse Trendelenburg.  +---------+---------------+---------+-----------+----------+--------------+ RIGHT    CompressibilityPhasicitySpontaneityPropertiesThrombus Aging +---------+---------------+---------+-----------+----------+--------------+ CFV      Full           Yes      Yes                                 +---------+---------------+---------+-----------+----------+--------------+ SFJ      Full                                                        +---------+---------------+---------+-----------+----------+--------------+ FV Prox  Full                                                        +---------+---------------+---------+-----------+----------+--------------+ FV Mid   Full                                                        +---------+---------------+---------+-----------+----------+--------------+ FV DistalFull                                                        +---------+---------------+---------+-----------+----------+--------------+ PFV      Full                                                        +---------+---------------+---------+-----------+----------+--------------+ POP      Full           Yes      Yes                                 +---------+---------------+---------+-----------+----------+--------------+ PTV      Full                                                         +---------+---------------+---------+-----------+----------+--------------+  PERO     Full                                                        +---------+---------------+---------+-----------+----------+--------------+   +---------+---------------+---------+-----------+----------+--------------+ LEFT     CompressibilityPhasicitySpontaneityPropertiesThrombus Aging +---------+---------------+---------+-----------+----------+--------------+ CFV      Full           Yes      Yes                                 +---------+---------------+---------+-----------+----------+--------------+ SFJ      Full                                                        +---------+---------------+---------+-----------+----------+--------------+ FV Prox  Full                                                        +---------+---------------+---------+-----------+----------+--------------+ FV Mid   Full                                                        +---------+---------------+---------+-----------+----------+--------------+ FV DistalFull                                                        +---------+---------------+---------+-----------+----------+--------------+ PFV      Full                                                        +---------+---------------+---------+-----------+----------+--------------+ POP      Full           Yes      Yes                                 +---------+---------------+---------+-----------+----------+--------------+ PTV      Full                                                        +---------+---------------+---------+-----------+----------+--------------+ PERO  Not visualized +---------+---------------+---------+-----------+----------+--------------+ Gastroc  None                                         Acute           +---------+---------------+---------+-----------+----------+--------------+     Summary: RIGHT: - There is no evidence of deep vein thrombosis in the lower extremity.  - No cystic structure found in the popliteal fossa.  LEFT: - Findings consistent with acute deep vein thrombosis involving the left gastrocnemius veins. - No cystic structure found in the popliteal fossa.  *See table(s) above for measurements and observations. Electronically signed by Curt Jews MD on 11/29/2019 at 4:25:09 PM.    Final    US Abdomen Limited RUQ  Result Date: 11/29/2019 CLINICAL DATA:  Right upper quadrant pain 3 months. EXAM: ULTRASOUND ABDOMEN LIMITED RIGHT UPPER QUADRANT COMPARISON:  05/08/2007 FINDINGS: Gallbladder: Significant cholelithiasis with largest stone measuring 1.1 cm. Gallbladder wall thickening measuring 5.5 mm. Negative sonographic Murphy sign. No adjacent free fluid. Common bile duct: Diameter: 5.7 mm. Liver: Heterogeneous parenchymal echogenicity with numerous liver masses many of which have a more hyperechoic periphery with more hypoechoic appearance centrally suggesting a somewhat target appearance. There is a large irregular shape cystic lesion with echogenic periphery over the right lobe measuring approximately 7.3 cm. Portal vein is patent on color Doppler imaging with normal direction of blood flow towards the liver. Other: None. IMPRESSION: 1. Moderate to severe cholelithiasis with mild gallbladder wall thickening of 5.5 mm. Recommend clinical correlation as findings could be seen with acute cholecystitis. 2. Numerous liver masses with the largest over the right lobe measuring 7.3 cm containing central cystic/necrotic area. Findings are highly suspicious for metastatic disease. Recommend clinical correlation as further evaluation with chest/abdominal/pelvic CT may be helpful to search for primary malignancy. Electronically Signed   By: Marin Olp M.D.   On: 11/29/2019 09:11    Labs:  CBC: Recent  Labs    12/01/19 0334 12/01/19 0334 12/02/19 0502 12/03/19 0411 12/03/19 2045 12/04/19 0359  WBC 11.0*  --  9.5 9.7  --  9.8  HGB 7.4*   < > 7.6* 7.1* 8.5* 8.6*  HCT 24.0*   < > 24.7* 23.2* 27.4* 27.4*  PLT 510*  --  475* 422*  --  575*   < > = values in this interval not displayed.    COAGS: No results for input(s): INR, APTT in the last 8760 hours.  BMP: Recent Labs    12/01/19 0334 12/02/19 0502 12/03/19 0411 12/04/19 0359  NA 134* 135 135 135  K 3.0* 3.2* 3.5 3.4*  CL 102 105 103 102  CO2 19* 22 24 27   GLUCOSE 91 97 99 104*  BUN <5* <5* <5* <5*  CALCIUM 7.8* 7.8* 7.7* 8.0*  CREATININE 0.55 0.57 0.55 0.57  GFRNONAA >60 >60 >60 >60  GFRAA >60 >60 >60 >60    LIVER FUNCTION TESTS: Recent Labs    11/29/19 0732 11/29/19 1400 11/30/19 0309  BILITOT 1.2 1.2 0.9  AST 59* 56* 53*  ALT 24 21 19   ALKPHOS 175* 176* 162*  PROT 7.6 7.2 6.3*  ALBUMIN 2.6* 2.5* 2.1*    Assessment and Plan:  58 y.o, female inpatient. 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 Team is requesting portacath for chemotherapy while the  patient is inpatient prior to help prevent a cessation in anticoagulation as outpatient.  Pertinent Imaging 4.29.21 - CT chest  Pertinent IR History none  Pertinent Allergies NKDA  All labs are within acceptable parameters. Patient is on a heparin gtt Patient is afebrile.  IR consulted for possible portacath placement. Case has been reviewed and procedure approved by Dr. Annamaria Boots.  Patient tentatively scheduled for 5.6.21.  Team instructed to: Keep Patient to be NPO after midnight Hold heparin gtt 1 hour  Prior to procedure  IR will call patient when ready.  Risks and benefits of image guided port-a-catheter placement was discussed with the patient including, but not limited to bleeding, infection, pneumothorax, or fibrin sheath development  and need for additional procedures.  All of the patient's questions were answered, patient is agreeable to proceed.  Consent signed and in chart.    Thank you for this interesting consult.  I greatly enjoyed meeting Anita Hoffman and look forward to participating in their care.  A copy of this report was sent to the requesting provider on this date.  Electronically Signed: Avel Peace, NP 12/04/2019, 4:00 PM   I spent a total of 40 Minutes    in face to face in clinical consultation, greater than 50% of which was counseling/coordinating care for portacath placement

## 2019-12-04 NOTE — Progress Notes (Signed)
Pt states she would like her sister to be her contact person to receive updates on her medical condition.  Idolina Primer, RN

## 2019-12-04 NOTE — Progress Notes (Signed)
ANTICOAGULATION CONSULT NOTE - Follow Up Consult  Pharmacy Consult for heparin Indication: pulmonary embolus  No Known Allergies  Patient Measurements: Height: 5\' 2"  (157.5 cm) Weight: 79.3 kg (174 lb 12.5 oz) IBW/kg (Calculated) : 50.1 Heparin Dosing Weight: 72.4 kg  Vital Signs: Temp: 99.1 F (37.3 C) (05/04 0659) Temp Source: Oral (05/04 0659) BP: 123/76 (05/04 0607) Pulse Rate: 86 (05/04 0607)  Labs: Recent Labs    12/02/19 0502 12/02/19 0502 12/02/19 1210 12/03/19 0411 12/03/19 0411 12/03/19 2045 12/04/19 0359  HGB 7.6*   < >  --  7.1*   < > 8.5* 8.6*  HCT 24.7*   < >  --  23.2*  --  27.4* 27.4*  PLT 475*  --   --  422*  --   --  575*  HEPARINUNFRC 0.20*   < > 0.52 0.35  --   --  0.39  CREATININE 0.57  --   --  0.55  --   --  0.57   < > = values in this interval not displayed.    Estimated Creatinine Clearance: 75.7 mL/min (by C-G formula based on SCr of 0.57 mg/dL).   Assessment: 59 yr old female presenting to the ED with CP and abdominal pain was found to have multiple bilateral PE's with right heart strain. 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 Pharmacy was consulted to start IV heparin. Pt was not on anticoagulation PTA.  Heparin level this morning came back therapeutic at 0.39, on 2000 units/hr. No s/sx of bleeding or infusion issues. Hgb improved after 1 unit PRBC, Pltc elevated.   Goal of Therapy:  Heparin level 0.3-0.7 units/ml Monitor platelets by anticoagulation protocol: Yes   Plan:  -Continue heparin infusion at 2000 units/hr  -Continue to monitor daily HL, CBC, and for s/sx of bleeding  -Planning for lovenox at discharge per Dr. Marin Olp.  Marguerite Olea, [facility] Clinical Pharmacist Phone [phone]  12/04/2019 7:43 AM

## 2019-12-05 ENCOUNTER — Encounter: Payer: Self-pay | Admitting: *Deleted

## 2019-12-05 LAB — CBC
HCT: 27.9 % — ABNORMAL LOW (ref 36.0–46.0)
Hemoglobin: 8.7 g/dL — ABNORMAL LOW (ref 12.0–15.0)
MCH: 25.2 pg — ABNORMAL LOW (ref 26.0–34.0)
MCHC: 31.2 g/dL (ref 30.0–36.0)
MCV: 80.9 fL (ref 80.0–100.0)
Platelets: 618 10*3/uL — ABNORMAL HIGH (ref 150–400)
RBC: 3.45 MIL/uL — ABNORMAL LOW (ref 3.87–5.11)
RDW: 18.5 % — ABNORMAL HIGH (ref 11.5–15.5)
WBC: 9.8 10*3/uL (ref 4.0–10.5)
nRBC: 0.2 % (ref 0.0–0.2)

## 2019-12-05 LAB — BASIC METABOLIC PANEL
Anion gap: 11 (ref 5–15)
BUN: <5 mg/dL — ABNORMAL LOW (ref 6–20)
CO2: 24 mmol/L (ref 22–32)
Calcium: 8.2 mg/dL — ABNORMAL LOW (ref 8.9–10.3)
Chloride: 101 mmol/L (ref 98–111)
Creatinine, Ser: 0.58 mg/dL (ref 0.44–1.00)
GFR calc Af Amer: >60 mL/min (ref >60)
GFR calc non Af Amer: >60 mL/min (ref >60)
Glucose, Bld: 91 mg/dL (ref 70–99)
Potassium: 4.3 mmol/L (ref 3.5–5.1)
Sodium: 136 mmol/L (ref 135–145)

## 2019-12-05 LAB — MAGNESIUM: Magnesium: 2.2 mg/dL (ref 1.7–2.4)

## 2019-12-05 LAB — HEPARIN LEVEL (UNFRACTIONATED): Heparin Unfractionated: 0.49 IU/mL (ref 0.30–0.70)

## 2019-12-05 MED ORDER — ENOXAPARIN SODIUM 80 MG/0.8ML ~~LOC~~ SOLN
1.0000 mg/kg | Freq: Two times a day (BID) | SUBCUTANEOUS | 0 refills | Status: DC
Start: 2019-12-05 — End: 2020-02-07

## 2019-12-05 MED FILL — ENOXAPARIN SODIUM 80 MG/0.8: 80 | 30 days supply | Qty: 48 | Fill #0

## 2019-12-05 NOTE — Progress Notes (Signed)
As expected, the biopsy from the colonoscopy came back positive for adenocarcinoma.  Her CEA is over 10,000.  I think it is apparent that she does have metastatic disease.  We will await the molecular markers for the colon cancer.  I think she is going for a Port-A-Cath to be placed.  She will be a good candidate for systemic chemotherapy.  She has been in good shape.  She still seems to be in good shape.  I think that as far as outpatient anticoagulation goes, she would probably do best with Lovenox for a while.  This can help thin out the blood clots in the lung.  Once I know that these blood clots are thinned out, then we can switch over to one of the new oral anticoagulants.  I think as far as chemotherapy is concerned, I think she would be a good candidate for FOLFOXIRI.  I think she could tolerate this.  The molecular markers will dictate whether or not we can use targeted therapy.  She has had improved shortness of breath.  There is no chest wall pain.  She is going to the bathroom.  She is having no nausea or vomiting.  There is no abdominal pain.  She is iron deficient.  We did go ahead and give her a dose of IV iron.  She does have HIV.  However she has a very good CD4 count.  I really do not think that chemotherapy will affect this.  Her labs today show white cell count 9.8.  Hemoglobin 8.7.  Platelet count 618,000.  Her sodium is 136.  Potassium 4.3.  BUN less than 5 creatinine 0.58.  We can certainly do treatment as an outpatient.  Her faith remains strong.  We had a very good prayer session.  Criss Rosales, MD  2 Corinthians 10:17

## 2019-12-05 NOTE — Progress Notes (Addendum)
ANTICOAGULATION CONSULT NOTE - Follow Up Consult  Pharmacy Consult for heparin Indication: pulmonary embolus  No Known Allergies  Patient Measurements: Height: 5\' 2"  (157.5 cm) Weight: 79.6 kg (175 lb 8.1 oz) IBW/kg (Calculated) : 50.1 Heparin Dosing Weight: 72.4 kg  Vital Signs: Temp: 98.9 F (37.2 C) (05/05 0450) Temp Source: Oral (05/05 0450) BP: 119/75 (05/05 0450) Pulse Rate: 84 (05/04 2351)  Labs: Recent Labs    12/03/19 0411 12/03/19 0411 12/03/19 2045 12/03/19 2045 12/04/19 0359 12/05/19 0455  HGB 7.1*   < > 8.5*   < > 8.6* 8.7*  HCT 23.2*   < > 27.4*  --  27.4* 27.9*  PLT 422*  --   --   --  575* 618*  HEPARINUNFRC 0.35  --   --   --  0.39 0.49  CREATININE 0.55  --   --   --  0.57 0.58   < > = values in this interval not displayed.    Estimated Creatinine Clearance: 75.8 mL/min (by C-G formula based on SCr of 0.58 mg/dL).   Assessment: 59 yr old female presenting to the ED with CP and abdominal pain was found to have multiple bilateral PE's with right heart strain. 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 Pharmacy was consulted to start IV heparin. Pt was not on anticoagulation PTA.  Heparin level this morning came back therapeutic at 0.49, on 2000 units/hr. No s/sx of bleeding or infusion issues. Hgb 8.7, plt 618. Plan for Portacath placement on 5/6.   Goal of Therapy:  Heparin level 0.3-0.7 units/ml Monitor platelets by anticoagulation protocol: Yes   Plan:  -Continue heparin infusion at 2000 units/hr - per IR note, hold heparin 1 hour prior to procedure on 5/6 -Continue to monitor daily HL, CBC, and for s/sx of bleeding  -Planning for lovenox at discharge per Dr. Arlington Calix, PharmD, [place] Pharmacist  Phone: [phone] 12/05/2019 7:21 AM  Please check AMION for all [facility] phone numbers After 10:00 PM, call [place] [phone]

## 2019-12-05 NOTE — Progress Notes (Signed)
Per Dr Marin Olp, specimen [phone] DOS 12/01/19 request sent for [facility] testing.

## 2019-12-05 NOTE — Progress Notes (Signed)
Patient ID: Anita Hoffman, female   DOB: [birth year], 59 y.o.   MRN: [id number]    4 Days Post-Op  Subjective: Still eating and moving her bowels.  Denies nausea and bloating.  Minimal RUQ abdominal pain.  ROS: See above, otherwise other systems negative  Objective: Vital signs in last 24 hours: Temp:  [98.8 F (37.1 C)-100.3 F (37.9 C)] 99.2 F (37.3 C) (05/05 0759) Pulse Rate:  [73-88] 88 (05/05 0759) Resp:  [18-20] 18 (05/05 0759) BP: (119-156)/(75-89) 156/89 (05/05 0759) SpO2:  [97 %-100 %] 100 % (05/05 0759) Weight:  [79.6 kg] 79.6 kg (05/05 0450) Last BM Date: 12/04/19  Intake/Output from previous day: 05/04 0701 - 05/05 0700 In: 1059.9 [P.O.:600; I.V.:459.9] Out: 1700 [Urine:1700] Intake/Output this shift: No intake/output data recorded.  PE: Abd: soft, full in RUQ, otherwise nontender, ND, +BS  Lab Results:  Recent Labs    12/04/19 0359 12/05/19 0455  WBC 9.8 9.8  HGB 8.6* 8.7*  HCT 27.4* 27.9*  PLT 575* 618*   BMET Recent Labs    12/04/19 0359 12/05/19 0455  NA 135 136  K 3.4* 4.3  CL 102 101  CO2 27 24  GLUCOSE 104* 91  BUN <5* <5*  CREATININE 0.57 0.58  CALCIUM 8.0* 8.2*   PT/INR No results for input(s): LABPROT, INR in the last 72 hours. CMP     Component Value Date/Time   NA 136 12/05/2019 0455   K 4.3 12/05/2019 0455   CL 101 12/05/2019 0455   CO2 24 12/05/2019 0455   GLUCOSE 91 12/05/2019 0455   BUN <5 (L) 12/05/2019 0455   CREATININE 0.58 12/05/2019 0455   CALCIUM 8.2 (L) 12/05/2019 0455   PROT 6.3 (L) 11/30/2019 0309   ALBUMIN 2.1 (L) 11/30/2019 0309   AST 53 (H) 11/30/2019 0309   ALT 19 11/30/2019 0309   ALKPHOS 162 (H) 11/30/2019 0309   BILITOT 0.9 11/30/2019 0309   GFRNONAA >60 12/05/2019 0455   GFRAA >60 12/05/2019 0455   Lipase     Component Value Date/Time   LIPASE 17 11/29/2019 0732       Studies/Results: No results found.  Anti-infectives: Anti-infectives (From admission, onward)   Start      Dose/Rate Route Frequency Ordered Stop   12/06/19 1000  ceFAZolin (ANCEF) IVPB 1 g/50 mL premix     1 g 100 mL/hr over 30 Minutes Intravenous  Once 12/04/19 2253     12/03/19 0000  bictegravir-emtricitabine-tenofovir AF (BIKTARVY) 50-200-25 MG TABS tablet    Note to Pharmacy: BIN: [id number] PCN: [id number] Group: [id number] ID: [id number]   1 tablet Oral Daily 12/03/19 1345     12/01/19 1000  bictegravir-emtricitabine-tenofovir AF (BIKTARVY) 50-200-25 MG per tablet 1 tablet     1 tablet Oral Daily 12/01/19 0939         Assessment/Plan Bilateral pulmonary embolus- heparin gtt HIV +  - ID following Anemia   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  - path, invasive adenoca from cecum -CEA greater than 10000 -patient is not clinically obstructed.  Will defer surgical intervention at this time -will defer further care to oncology regarding systemic therapy.  She can be referred back to [facility] prn  -will sign off at this time.  RUQ pain Cholelithiasis -no cholecystitis  FEN -Soft VTE -SCDs, Heparin drip ID -None   LOS: 6 days    Henreitta Cea , [facility] Surgery 12/05/2019, 9:30 AM Please  see Amion for pager number during day hours 7:00am-4:30pm or 7:00am -11:30am on weekends

## 2019-12-05 NOTE — Progress Notes (Signed)
Occupational Therapy Treatment Patient Details Name: Anita Hoffman MRN: [id number] DOB: 1961-04-03 Today's Date: 12/05/2019    History of present illness 59 y/o F with a PMH tobacco use and tubal ligation who presented to the ED with a cc epigastric/RUQ abdominal pain along with SOB for the last 3 weeks. Admitted 11/28/19 for treatment of bilateral PE and cholelithiasi and Non-obstructing cecal mass with likely liver metastasis    OT comments  Pt making progress in therapy, demonstrating improved activity tolerance. Pt completed tub/shower transfer with supervision this date, demonstrating improved balance. Pt able to ambulate around room 1 x 10 min while completing bed making task. 0 instances of LOB. Pt tolerated standing and ambulating additional 4 min around unit. Pt required min cues throughout for safety with line management. Educated pt on energy conservation techniques with handout provided. HR max 152 during session with pt requiring ~1-2 min seated recovery to return to 110s. No signs/symptoms of distress. OT will continue to follow acutely.    Follow Up Recommendations  No OT follow up    Equipment Recommendations  3 in 1 bedside commode(for use in shower)    Recommendations for Other Services      Precautions / Restrictions Precautions Precautions: None Restrictions Weight Bearing Restrictions: No       Mobility Bed Mobility Overal bed mobility: Independent                Transfers Overall transfer level: Independent Equipment used: None             General transfer comment: Good balance and safety    Balance Overall balance assessment: No apparent balance deficits (not formally assessed)                                         ADL either performed or assessed with clinical judgement   ADL Overall ADL's : Needs assistance/impaired                                 Tub/ Shower Transfer: Supervision/safety;Tub  transfer Tub/Shower Transfer Details (indicate cue type and reason): Simulated in room. 0 instances of LOB.  Functional mobility during ADLs: Supervision/safety General ADL Comments: Pt able to ambulate around room and to/from bathroom with supervision to ensure safety with line management. 0 instances of LOB.      Vision       Perception     Praxis      Cognition Arousal/Alertness: Awake/alert Behavior During Therapy: WFL for tasks assessed/performed Overall Cognitive Status: Within Functional Limits for tasks assessed                                          Exercises     Shoulder Instructions       General Comments HR max 152 during session. No signs/symptoms of distress. HR back down to 106 at end of session. Educated and provided pt with handout regarding energy conservation strategies.      Pertinent Vitals/ Pain       Pain Assessment: No/denies pain  Home Living  Prior Functioning/Environment              Frequency           Progress Toward Goals  OT Goals(current goals can now be found in the care plan section)  Progress towards OT goals: Progressing toward goals  ADL Goals Pt Will Perform Tub/Shower Transfer: Tub transfer;Independently Additional ADL Goal #1: Pt to complete all ADLs independently with 0 instances of LOB. Additional ADL Goal #2: Pt to complete higher level IADLs (i.e. bed making, item retrieval) with supervision. Additional ADL Goal #3: Pt to tolerate standing up to 10 min independently, in preparation for ADLs. Additional ADL Goal #4: Pt to recall and verbalize 3 energy conservation strategies with 0 verbal cues.  Plan Discharge plan remains appropriate    Co-evaluation                 AM-PAC OT "6 Clicks" Daily Activity     Outcome Measure   Help from another person eating meals?: None Help from another person taking care of personal  grooming?: A Little Help from another person toileting, which includes using toliet, bedpan, or urinal?: A Little Help from another person bathing (including washing, rinsing, drying)?: A Little Help from another person to put on and taking off regular upper body clothing?: A Little Help from another person to put on and taking off regular lower body clothing?: A Little 6 Click Score: 19    End of Session    OT Visit Diagnosis: Muscle weakness (generalized) (M62.81)   Activity Tolerance Patient tolerated treatment well   Patient Left in bed;with call bell/phone within reach   Nurse Communication Mobility status        Time: [id number] OT Time Calculation (min): 27 min  Charges: OT General Charges $OT Visit: 1 Visit OT Treatments $Therapeutic Activity: 23-37 mins  Mauri Brooklyn OTR/L [phone]   Mauri Brooklyn 12/05/2019, 2:48 PM

## 2019-12-05 NOTE — Care Management (Signed)
1156 12-05-19 MATCH completed for this patient. Rx's sent to the transitions of care pharmacy and the medications will be delivered to the room prior to transition home. Bethena Roys, RN,BSN Case Manager

## 2019-12-05 NOTE — Progress Notes (Signed)
PROGRESS NOTE    Anita Hoffman  [id number] DOB: 07/04/1961 DOA: 11/28/2019 PCP: Patient, No Pcp Per   Brief Narrative:  59 year old with history of tobacco use presented to the hospital with worsening abdominal pain and shortness of breath for 3 weeks.  Also reported of weight loss.  Right upper quadrant ultrasound showed moderate to severe cholelithiasis with gallbladder wall thickening, numerous liver masses.  CT angio showed bilateral PE.  CT abdomen showed large cecal mass.  GI and general surgery were consulted.  Underwent colonoscopy on 5/1 showing obstructive malignant tumor in the cecum area with some polyps.  Biopsy performed eventually resulted invasive adenocarcinoma.  General surgery and oncology consulted.   Assessment & Plan:   Principal Problem:   Bilateral pulmonary embolism (HCC) Active Problems:   Cecal neoplasm   Liver metastases (HCC)   Elevated troponin   RUQ abdominal pain   Adenomatous polyp of ascending colon   Adenomatous polyp of sigmoid colon   Metastatic malignant neoplasm (HCC)   HIV disease (HCC)  Acute bilateral pulmonary embolism with cor pulmonale Left lower extremity DVT -Continue heparin drip, oncology recommending Lovenox at the time of discharge.  Have alerted pharmacy to acquire supplies which should be here tomorrow -Lower extremity Dopplers-left lower extremity DVT. -Echocardiogram-EF 60-65%, grade 1 DD, normal right ventricles, normal PA pressures -Supportive care, supplemental oxygen as needed  Metastatic cecal mass 5.6 x 4.0 X 4.5 cm/hepatic/mesenteric metastases, invasive adenocarcinoma Unintentional weight loss -Status post colonoscopy 5/1 showing obstructive tumor.  Biopsy showed invasive adenocarcinoma.  CEA resulted greater than 10,000. - Port placement pending 12/06/19 -No urgent surgical plans for the mass as it does not seem to be obstructing.  Patient is passing gas and tolerating p.o.  The day will be available when it  becomes necessary -Oncology team following  Microcytic anemia, hemoglobin 8.4 Iron deficiency anemia -Status post 1 dose of IV Feraheme.  Now on p.o. supplements.  Bowel regimen. -B12 and TSH within normal limit, folate-normal  HIV, reactive -CD4 count around 500.  Viral load reviewed ~6K.  Seen by infectious disease, started on Biktarvy 5/1 -Hepatitis B-negative  Cholelithiasis with concerns for cholecystitis, on imaging -No surgery indicated at this time.  Seen by general surgery.  Given her advance diagnosis, I offered her palliative care services as well.  DVT prophylaxis: Heparin drip Code Status: Full code Family Communication: I have not spoken with family at patient's request because of sensitive information.  Patient states she would inform her family on her own.  I have offered to speak with them with her permission when she is ready to discuss.  Status is: Inpatient Remains inpatient appropriate because:Ongoing diagnostic testing needed not appropriate for outpatient work up.  Maintain hospital stay Dispo: The patient is from: Home              Anticipated d/c is to: Home              Anticipated d/c date is: 1 day              Patient currently is not medically stable to d/c.  Still undergoing evaluation from oncology standpoint to determine further plan going forward.  In the meantime awaiting supplies for Lovenox to be delivered and port placement to be performed.  Subjective: No acute issues or events overnight, denies abdominal pain, constipation, diarrhea, headache, fevers, shortness of breath.  Examination: Constitutional: Not in acute distress Respiratory: Clear to auscultation bilaterally Cardiovascular: Normal sinus rhythm, no rubs Abdomen:  Nontender nondistended good bowel sounds Musculoskeletal: No edema noted Skin: No rashes seen Neurologic: CN 2-12 grossly intact.  And nonfocal Psychiatric: Normal judgment and insight. Alert and oriented x 3. Normal  mood.  Objective: Vitals:   12/04/19 2011 12/04/19 2351 12/05/19 0450 12/05/19 0759  BP: 121/89 (!) 144/87 119/75 (!) 156/89  Pulse:  84  88  Resp: 20 20 18 18   Temp: 99.8 F (37.7 C) 100.3 F (37.9 C) 98.9 F (37.2 C) 99.2 F (37.3 C)  TempSrc: Oral Oral Oral Oral  SpO2: 97% 100% 98% 100%  Weight:   79.6 kg   Height:        Intake/Output Summary (Last 24 hours) at 12/05/2019 1215 Last data filed at 12/05/2019 0600 Gross per 24 hour  Intake 819.89 ml  Output 1700 ml  Net -880.11 ml   Filed Weights   12/03/19 0517 12/04/19 0607 12/05/19 0450  Weight: 79.4 kg 79.3 kg 79.6 kg     Data Reviewed:   CBC: Recent Labs  Lab 11/29/19 1400 11/30/19 0309 12/01/19 0334 12/01/19 0334 12/02/19 0502 12/03/19 0411 12/03/19 2045 12/04/19 0359 12/05/19 0455  WBC 11.0*   < > 11.0*  --  9.5 9.7  --  9.8 9.8  NEUTROABS 7.5  --   --   --   --   --   --   --   --   HGB 8.4*   < > 7.4*   < > 7.6* 7.1* 8.5* 8.6* 8.7*  HCT 28.1*   < > 24.0*   < > 24.7* 23.2* 27.4* 27.4* 27.9*  MCV 80.7   < > 76.9*  --  76.9* 76.6*  --  79.7* 80.9  PLT 464*   < > 510*  --  475* 422*  --  575* 618*   < > = values in this interval not displayed.   Basic Metabolic Panel: Recent Labs  Lab 12/01/19 0334 12/02/19 0502 12/03/19 0411 12/04/19 0359 12/05/19 0455  NA 134* 135 135 135 136  K 3.0* 3.2* 3.5 3.4* 4.3  CL 102 105 103 102 101  CO2 19* 22 24 27 24   GLUCOSE 91 97 99 104* 91  BUN <5* <5* <5* <5* <5*  CREATININE 0.55 0.57 0.55 0.57 0.58  CALCIUM 7.8* 7.8* 7.7* 8.0* 8.2*  MG 1.8 2.1 1.8 1.8 2.2   GFR: Estimated Creatinine Clearance: 75.8 mL/min (by C-G formula based on SCr of 0.58 mg/dL). Liver Function Tests: Recent Labs  Lab 11/29/19 0732 11/29/19 1400 11/30/19 0309  AST 59* 56* 53*  ALT 24 21 19   ALKPHOS 175* 176* 162*  BILITOT 1.2 1.2 0.9  PROT 7.6 7.2 6.3*  ALBUMIN 2.6* 2.5* 2.1*   Recent Labs  Lab 11/29/19 0732  LIPASE 17   No results for input(s): AMMONIA in the last 168  hours. Coagulation Profile: No results for input(s): INR, PROTIME in the last 168 hours. Cardiac Enzymes: No results for input(s): CKTOTAL, CKMB, CKMBINDEX, TROPONINI in the last 168 hours. BNP (last 3 results) No results for input(s): PROBNP in the last 8760 hours. HbA1C: No results for input(s): HGBA1C in the last 72 hours. CBG: No results for input(s): GLUCAP in the last 168 hours. Lipid Profile: No results for input(s): CHOL, HDL, LDLCALC, TRIG, CHOLHDL, LDLDIRECT in the last 72 hours. Thyroid Function Tests: No results for input(s): TSH, T4TOTAL, FREET4, T3FREE, THYROIDAB in the last 72 hours. Anemia Panel: No results for input(s): VITAMINB12, FOLATE, FERRITIN, TIBC, IRON, RETICCTPCT in the last 72  hours. Sepsis Labs: No results for input(s): PROCALCITON, LATICACIDVEN in the last 168 hours.  Recent Results (from the past 240 hour(s))  Respiratory Panel by RT PCR (Flu A&B, Covid) - Nasopharyngeal Swab     Status: None   Collection Time: 11/29/19 10:28 AM   Specimen: Nasopharyngeal Swab  Result Value Ref Range Status   SARS Coronavirus 2 by RT PCR NEGATIVE NEGATIVE Final    Comment: (NOTE) SARS-CoV-2 target nucleic acids are NOT DETECTED. The SARS-CoV-2 RNA is generally detectable in upper respiratoy specimens during the acute phase of infection. The lowest concentration of SARS-CoV-2 viral copies this assay can detect is 131 copies/mL. A negative result does not preclude SARS-Cov-2 infection and should not be used as the sole basis for treatment or other patient management decisions. A negative result may occur with  improper specimen collection/handling, submission of specimen other than nasopharyngeal swab, presence of viral mutation(s) within the areas targeted by this assay, and inadequate number of viral copies (<131 copies/mL). A negative result must be combined with clinical observations, patient history, and epidemiological information. The expected result is  Negative. Fact Sheet for Patients:  PinkCheek.be Fact Sheet for Healthcare Providers:  GravelBags.it This test is not yet ap proved or cleared by the [place] FDA and  has been authorized for detection and/or diagnosis of SARS-CoV-2 by FDA under an Emergency Use Authorization (EUA). This EUA will remain  in effect (meaning this test can be used) for the duration of the COVID-19 declaration under Section 564(b)(1) of the Act, 21 U.S.C. section 360bbb-3(b)(1), unless the authorization is terminated or revoked sooner.    Influenza A by PCR NEGATIVE NEGATIVE Final   Influenza B by PCR NEGATIVE NEGATIVE Final    Comment: (NOTE) The Xpert Xpress SARS-CoV-2/FLU/RSV assay is intended as an aid in  the diagnosis of influenza from Nasopharyngeal swab specimens and  should not be used as a sole basis for treatment. Nasal washings and  aspirates are unacceptable for Xpert Xpress SARS-CoV-2/FLU/RSV  testing. Fact Sheet for Patients: PinkCheek.be Fact Sheet for Healthcare Providers: GravelBags.it This test is not yet approved or cleared by the [place] FDA and  has been authorized for detection and/or diagnosis of SARS-CoV-2 by  FDA under an Emergency Use Authorization (EUA). This EUA will remain  in effect (meaning this test can be used) for the duration of the  Covid-19 declaration under Section 564(b)(1) of the Act, 21  U.S.C. section 360bbb-3(b)(1), unless the authorization is  terminated or revoked. Performed at [facility], [place] [address] [place],  [address]          Radiology Studies: No results found.      Scheduled Meds: . bictegravir-emtricitabine-tenofovir AF  1 tablet Oral Daily   Continuous Infusions: . [START ON 12/06/2019]  ceFAZolin (ANCEF) IV    . heparin 2,000 Units/hr (12/05/19 1014)     LOS: 6 days   Time spent= 35  mins  Little Ishikawa, DO [facility]  If 7PM-7AM, please contact night-coverage  12/05/2019, 12:15 PM

## 2019-12-06 ENCOUNTER — Encounter: Payer: Self-pay | Admitting: Hematology & Oncology

## 2019-12-06 ENCOUNTER — Other Ambulatory Visit: Payer: Self-pay | Admitting: Hematology & Oncology

## 2019-12-06 ENCOUNTER — Inpatient Hospital Stay (HOSPITAL_COMMUNITY): Payer: Self-pay

## 2019-12-06 DIAGNOSIS — Z7189 Other specified counseling: Secondary | ICD-10-CM

## 2019-12-06 HISTORY — DX: Other specified counseling: Z71.89

## 2019-12-06 HISTORY — PX: IR IMAGING GUIDED PORT INSERTION: IMG5740

## 2019-12-06 LAB — HEPARIN LEVEL (UNFRACTIONATED): Heparin Unfractionated: 0.37 IU/mL (ref 0.30–0.70)

## 2019-12-06 LAB — SURGICAL PATHOLOGY

## 2019-12-06 MED ORDER — CEFAZOLIN SODIUM-DEXTROSE 2-4 GM/100ML-% IV SOLN
INTRAVENOUS | Status: AC
  Administered 2019-12-06: 2 g via INTRAVENOUS
  Filled 2019-12-06: qty 100

## 2019-12-06 MED ORDER — MIDAZOLAM HCL 2 MG/2ML IJ SOLN
INTRAMUSCULAR | Status: AC
  Filled 2019-12-06: qty 2

## 2019-12-06 MED ORDER — CHLORHEXIDINE GLUCONATE 4 % EX LIQD
CUTANEOUS | Status: AC
  Filled 2019-12-06: qty 15

## 2019-12-06 MED ORDER — MIDAZOLAM HCL 2 MG/2ML IJ SOLN
INTRAMUSCULAR | Status: AC | PRN
  Administered 2019-12-06 (×2): 1 mg via INTRAVENOUS

## 2019-12-06 MED ORDER — FENTANYL CITRATE (PF) 100 MCG/2ML IJ SOLN
INTRAMUSCULAR | Status: AC | PRN
  Administered 2019-12-06 (×2): 50 ug via INTRAVENOUS

## 2019-12-06 MED ORDER — LIDOCAINE-EPINEPHRINE 1 %-1:100000 IJ SOLN
INTRAMUSCULAR | Status: AC
  Filled 2019-12-06: qty 1

## 2019-12-06 MED ORDER — CEFAZOLIN SODIUM-DEXTROSE 2-4 GM/100ML-% IV SOLN
2.0000 g | Freq: Once | INTRAVENOUS | Status: AC
  Filled 2019-12-06: qty 100

## 2019-12-06 MED ORDER — FENTANYL CITRATE (PF) 100 MCG/2ML IJ SOLN
INTRAMUSCULAR | Status: AC
  Filled 2019-12-06: qty 2

## 2019-12-06 MED ORDER — HEPARIN SOD (PORK) LOCK FLUSH 100 UNIT/ML IV SOLN
INTRAVENOUS | Status: AC
  Filled 2019-12-06: qty 5

## 2019-12-06 NOTE — Progress Notes (Signed)
ANTICOAGULATION CONSULT NOTE - Follow Up Consult  Pharmacy Consult for heparin Indication: pulmonary embolus  No Known Allergies  Patient Measurements: Height: 5\' 2"  (157.5 cm) Weight: 77.2 kg (170 lb 1.6 oz) IBW/kg (Calculated) : 50.1 Heparin Dosing Weight: 72.4 kg  Vital Signs: Temp: 99.6 F (37.6 C) (05/06 0334) Temp Source: Oral (05/06 0334) BP: 137/77 (05/06 0334) Pulse Rate: 90 (05/06 0334)  Labs: Recent Labs    12/03/19 2045 12/03/19 2045 12/04/19 0359 12/05/19 0455 12/06/19 0328  HGB 8.5*   < > 8.6* 8.7*  --   HCT 27.4*  --  27.4* 27.9*  --   PLT  --   --  575* 618*  --   HEPARINUNFRC  --   --  0.39 0.49 0.37  CREATININE  --   --  0.57 0.58  --    < > = values in this interval not displayed.    Estimated Creatinine Clearance: 74.6 mL/min (by C-G formula based on SCr of 0.58 mg/dL).   Assessment: 59 yr old female presenting to the ED with CP and abdominal pain was found to have multiple bilateral PE's with right heart strain. 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 Pharmacy was consulted to start IV heparin. Pt was not on anticoagulation PTA.  Heparin level this morning came back therapeutic at 0.37, on 2000 units/hr. No s/sx of bleeding or infusion issues. Hgb 8.7, plt 618- on last check 5/5. Plan for Portacath placement on 5/6.   Goal of Therapy:  Heparin level 0.3-0.7 units/ml Monitor platelets by anticoagulation protocol: Yes   Plan:  -Continue heparin infusion at 2000 units/hr - per IR note, hold heparin 1 hour prior to procedure on 5/6 -Continue to monitor daily HL, CBC, and for s/sx of bleeding  -Planning for lovenox at discharge per Dr. Arlington Calix, PharmD, [facility] Pharmacist  Phone: [phone] 12/06/2019 7:35 AM  Please check AMION for all [place] phone numbers After 10:00 PM, call [place] [phone]

## 2019-12-06 NOTE — Progress Notes (Signed)
HEMATOLOGY-ONCOLOGY PROGRESS NOTE  SUBJECTIVE: Port-A-Cath was placed earlier today and she tolerated the procedure well.  She does not currently have any abdominal pain, nausea, vomiting.  Passing flatus.  No bleeding.  Hospitalist planning to discharge later today.  REVIEW OF SYSTEMS:   Constitutional: Denies fevers, chills Eyes: Denies blurriness of vision Ears, nose, mouth, throat, and face: Denies mucositis or sore throat Respiratory: Denies cough, dyspnea or wheezes Cardiovascular: Denies palpitation, chest discomfort Gastrointestinal:  Denies nausea, heartburn or change in bowel habits Skin: Denies abnormal skin rashes Lymphatics: Denies new lymphadenopathy or easy bruising Neurological:Denies numbness, tingling or new weaknesses Behavioral/Psych: Mood is stable, no new changes  Extremities: No lower extremity edema All other systems were reviewed with the patient and are negative.  I have reviewed the past medical history, past surgical history, social history and family history with the patient and they are unchanged from previous note.   PHYSICAL EXAMINATION: ECOG PERFORMANCE STATUS: 1 - Symptomatic but completely ambulatory  Vitals:   12/06/19 1215 12/06/19 1234  BP: 109/77 106/76  Pulse: (!) 101 94  Resp: 20 20  Temp:    SpO2: 97% 97%   Filed Weights   12/04/19 0607 12/05/19 0450 12/06/19 0334  Weight: 79.3 kg 79.6 kg 77.2 kg    Intake/Output from previous day: 05/05 0701 - 05/06 0700 In: 681.4 [P.O.:222; I.V.:459.4] Out: 1000 [Urine:1000]  GENERAL:alert, no distress and comfortable SKIN: skin color, texture, turgor are normal, no rashes or significant lesions EYES: normal, Conjunctiva are pink and non-injected, sclera clear OROPHARYNX:no exudate, no erythema and lips, buccal mucosa, and tongue normal  NECK: supple, thyroid normal size, non-tender, without nodularity LYMPH:  no palpable lymphadenopathy in the cervical, axillary or inguinal LUNGS: clear to  auscultation and percussion with normal breathing effort HEART: regular rate & rhythm and no murmurs and no lower extremity edema ABDOMEN:abdomen soft, non-tender and normal bowel sounds Musculoskeletal:no cyanosis of digits and no clubbing  NEURO: alert & oriented x 3 with fluent speech, no focal motor/sensory deficits  LABORATORY DATA:  I have reviewed the data as listed CMP Latest Ref Rng & Units 12/05/2019 12/04/2019 12/03/2019  Glucose 70 - 99 mg/dL 91 104(H) 99  BUN 6 - 20 mg/dL <5(L) <5(L) <5(L)  Creatinine 0.44 - 1.00 mg/dL 0.58 0.57 0.55  Sodium 135 - 145 mmol/L 136 135 135  Potassium 3.5 - 5.1 mmol/L 4.3 3.4(L) 3.5  Chloride 98 - 111 mmol/L 101 102 103  CO2 22 - 32 mmol/L 24 27 24   Calcium 8.9 - 10.3 mg/dL 8.2(L) 8.0(L) 7.7(L)  Total Protein 6.5 - 8.1 g/dL - - -  Total Bilirubin 0.3 - 1.2 mg/dL - - -  Alkaline Phos 38 - 126 U/L - - -  AST 15 - 41 U/L - - -  ALT 0 - 44 U/L - - -    Lab Results  Component Value Date   WBC 9.8 12/05/2019   HGB 8.7 (L) 12/05/2019   HCT 27.9 (L) 12/05/2019   MCV 80.9 12/05/2019   PLT 618 (H) 12/05/2019   NEUTROABS 7.5 11/29/2019    DG Chest 2 View  Result Date: 11/28/2019 CLINICAL DATA:  59 year old female with history of chest and abdominal pain for the past 2 weeks. EXAM: CHEST - 2 VIEW COMPARISON:  No priors. FINDINGS: Lung volumes are normal. No consolidative airspace disease. No pleural effusions. No pneumothorax. No pulmonary nodule or mass noted. Pulmonary vasculature and the cardiomediastinal silhouette are within normal limits. IMPRESSION: No radiographic evidence of  acute cardiopulmonary disease. Electronically Signed   By: Vinnie Langton M.D.   On: 11/28/2019 13:46   CT Angio Chest PE W/Cm &/Or Wo Cm  Result Date: 11/29/2019 CLINICAL DATA:  RIGHT upper quadrant and RIGHT lower chest pain, shortness of breath, lower extremity edema, former smoker EXAM: CT ANGIOGRAPHY CHEST CT ABDOMEN AND PELVIS WITH CONTRAST TECHNIQUE: Multidetector  CT imaging of the chest was performed using the standard protocol during bolus administration of intravenous contrast. Multiplanar CT image reconstructions and MIPs were obtained to evaluate the vascular anatomy. Multidetector CT imaging of the abdomen and pelvis was performed using the standard protocol during bolus administration of intravenous contrast. CONTRAST:  124mL OMNIPAQUE IOHEXOL 300 MG/ML SOLN IV. No oral contrast. COMPARISON:  CT abdomen and pelvis 11/22/2006 FINDINGS: CTA CHEST FINDINGS Cardiovascular: Aorta normal caliber without aneurysm or dissection. Pulmonary arteries well opacified. Filling defects are seen within the pulmonary arteries bilaterally RIGHT greater than LEFT consistent with pulmonary emboli. These are greatest in the RIGHT lower lobe but affect all remaining lobes as well. RV/LV ratio = 1.0, elevated, consistent with RIGHT heart strain. Mediastinum/Nodes: Base of cervical region normal appearance. Normal sized axillary nodes bilaterally. No thoracic adenopathy. Esophagus unremarkable. Lungs/Pleura: Linear subsegmental atelectasis LEFT lower lobe. Lungs otherwise clear. No pulmonary infiltrate, pleural effusion, or pneumothorax. Musculoskeletal: No acute osseous findings. Review of the MIP images confirms the above findings. CT ABDOMEN and PELVIS FINDINGS Hepatobiliary: Multiple poorly defined mass lesions are seen throughout the liver consistent with extensive hepatic metastatic disease. Largest of these measure 6.6 x 4.2 cm lateral segment LEFT lobe image 43, 5.7 x 5.7 cm posterior RIGHT lobe image 34, anterior RIGHT lobe 7.2 x 5.0 cm image 33, and 7.6 x 7.1 cm anterior RIGHT lobe image 22. Gallbladder unremarkable. Pancreas: Normal appearance Spleen: Normal appearance Adrenals/Urinary Tract: Adrenal glands normal appearance. BILATERAL renal cysts. Kidneys, ureters, and bladder otherwise normal appearance. Stomach/Bowel: Normal appendix. Large mass identified at cecum extending  into ileocecal valve region and terminal ileum measuring 5.6 x 4.0 x 4.5 cm consistent with colonic neoplasm. Thickening of terminal ileum without obstruction. Stomach decompressed. Remaining large and small bowel loops unremarkable. Vascular/Lymphatic: Minimal atherosclerotic calcification aorta. Aorta normal caliber. Few pelvic phleboliths. Mesenteric mass with minimal calcification 4.1 x 2.8 cm image 54 likely nodal metastasis. Additional enlarged pathologic node mesentery medial to cecal tip 1.7 x 1.5 cm. Normal sized inguinal lymph nodes. Reproductive: Uterus and adnexa unremarkable Other: Tiny umbilical hernia containing fat. No free air or free fluid. Musculoskeletal: Bones demineralized. Lipoma involving the distal RIGHT iliopsoas muscle/tendon, 3.6 x 2.5 cm x 8.9 cm length. Review of the MIP images confirms the above findings. IMPRESSION: Multiple BILATERAL pulmonary emboli RIGHT greater than LEFT. Positive for acute PE with CT evidence of right heart strain (RV/LV Ratio = 1.0) consistent with at least submassive (intermediate risk) PE; the presence of right heart strain has been associated with an increased risk of morbidity and mortality. Large cecal mass extending into ileocecal valve region and terminal ileum measuring 5.6 x 4.0 x 4.5 cm consistent with colonic neoplasm. Multiple hepatic metastases. Mesenteric nodal metastases. Aortic Atherosclerosis (ICD10-I70.0). Critical Value/emergent results were called by telephone at the time of interpretation on 11/29/2019 at 12:24 pm to provider DR. ELIZABETH REES , who verbally acknowledged these results. Electronically Signed   By: Lavonia Dana M.D.   On: 11/29/2019 12:27   CT Abdomen Pelvis W Contrast  Result Date: 11/29/2019 CLINICAL DATA:  RIGHT upper quadrant and RIGHT lower chest pain,  shortness of breath, lower extremity edema, former smoker EXAM: CT ANGIOGRAPHY CHEST CT ABDOMEN AND PELVIS WITH CONTRAST TECHNIQUE: Multidetector CT imaging of the chest  was performed using the standard protocol during bolus administration of intravenous contrast. Multiplanar CT image reconstructions and MIPs were obtained to evaluate the vascular anatomy. Multidetector CT imaging of the abdomen and pelvis was performed using the standard protocol during bolus administration of intravenous contrast. CONTRAST:  122mL OMNIPAQUE IOHEXOL 300 MG/ML SOLN IV. No oral contrast. COMPARISON:  CT abdomen and pelvis 11/22/2006 FINDINGS: CTA CHEST FINDINGS Cardiovascular: Aorta normal caliber without aneurysm or dissection. Pulmonary arteries well opacified. Filling defects are seen within the pulmonary arteries bilaterally RIGHT greater than LEFT consistent with pulmonary emboli. These are greatest in the RIGHT lower lobe but affect all remaining lobes as well. RV/LV ratio = 1.0, elevated, consistent with RIGHT heart strain. Mediastinum/Nodes: Base of cervical region normal appearance. Normal sized axillary nodes bilaterally. No thoracic adenopathy. Esophagus unremarkable. Lungs/Pleura: Linear subsegmental atelectasis LEFT lower lobe. Lungs otherwise clear. No pulmonary infiltrate, pleural effusion, or pneumothorax. Musculoskeletal: No acute osseous findings. Review of the MIP images confirms the above findings. CT ABDOMEN and PELVIS FINDINGS Hepatobiliary: Multiple poorly defined mass lesions are seen throughout the liver consistent with extensive hepatic metastatic disease. Largest of these measure 6.6 x 4.2 cm lateral segment LEFT lobe image 43, 5.7 x 5.7 cm posterior RIGHT lobe image 34, anterior RIGHT lobe 7.2 x 5.0 cm image 33, and 7.6 x 7.1 cm anterior RIGHT lobe image 22. Gallbladder unremarkable. Pancreas: Normal appearance Spleen: Normal appearance Adrenals/Urinary Tract: Adrenal glands normal appearance. BILATERAL renal cysts. Kidneys, ureters, and bladder otherwise normal appearance. Stomach/Bowel: Normal appendix. Large mass identified at cecum extending into ileocecal valve region  and terminal ileum measuring 5.6 x 4.0 x 4.5 cm consistent with colonic neoplasm. Thickening of terminal ileum without obstruction. Stomach decompressed. Remaining large and small bowel loops unremarkable. Vascular/Lymphatic: Minimal atherosclerotic calcification aorta. Aorta normal caliber. Few pelvic phleboliths. Mesenteric mass with minimal calcification 4.1 x 2.8 cm image 54 likely nodal metastasis. Additional enlarged pathologic node mesentery medial to cecal tip 1.7 x 1.5 cm. Normal sized inguinal lymph nodes. Reproductive: Uterus and adnexa unremarkable Other: Tiny umbilical hernia containing fat. No free air or free fluid. Musculoskeletal: Bones demineralized. Lipoma involving the distal RIGHT iliopsoas muscle/tendon, 3.6 x 2.5 cm x 8.9 cm length. Review of the MIP images confirms the above findings. IMPRESSION: Multiple BILATERAL pulmonary emboli RIGHT greater than LEFT. Positive for acute PE with CT evidence of right heart strain (RV/LV Ratio = 1.0) consistent with at least submassive (intermediate risk) PE; the presence of right heart strain has been associated with an increased risk of morbidity and mortality. Large cecal mass extending into ileocecal valve region and terminal ileum measuring 5.6 x 4.0 x 4.5 cm consistent with colonic neoplasm. Multiple hepatic metastases. Mesenteric nodal metastases. Aortic Atherosclerosis (ICD10-I70.0). Critical Value/emergent results were called by telephone at the time of interpretation on 11/29/2019 at 12:24 pm to provider DR. ELIZABETH REES , who verbally acknowledged these results. Electronically Signed   By: Lavonia Dana M.D.   On: 11/29/2019 12:27   ECHOCARDIOGRAM COMPLETE  Result Date: 11/29/2019    ECHOCARDIOGRAM REPORT   Patient Name:   Anita Hoffman Date of Exam: 11/29/2019 Medical Rec #:  [id number]         Height:       62.0 in Accession #:    [id number]        Weight:  210.0 lb Date of Birth:  September 27, 1960         BSA:          1.952 m Patient Age:     8 years          BP:           133/77 mmHg Patient Gender: F                 HR:           97 bpm. Exam Location:  Inpatient Procedure: 2D Echo Indications:    Pulmonary Embolus I26.99  History:        Patient has no prior history of Echocardiogram examinations.                 Risk Factors:Former Smoker.  Sonographer:    Mikki Santee RDCS (AE) Referring Phys: [id number] Michell Heinrich PAHWANI IMPRESSIONS  1. Left ventricular ejection fraction, by estimation, is 60 to 65%. The left ventricle has normal function. The left ventricle has no regional wall motion abnormalities. There is mild concentric left ventricular hypertrophy. Left ventricular diastolic parameters are consistent with Grade I diastolic dysfunction (impaired relaxation).  2. Right ventricular systolic function is normal. The right ventricular size is normal. There is normal pulmonary artery systolic pressure.  3. The mitral valve is normal in structure. No evidence of mitral valve regurgitation. No evidence of mitral stenosis.  4. The aortic valve is normal in structure. Aortic valve regurgitation is not visualized. No aortic stenosis is present.  5. The inferior vena cava is normal in size with greater than 50% respiratory variability, suggesting right atrial pressure of 3 mmHg. FINDINGS  Left Ventricle: Left ventricular ejection fraction, by estimation, is 60 to 65%. The left ventricle has normal function. The left ventricle has no regional wall motion abnormalities. The left ventricular internal cavity size was normal in size. There is  mild concentric left ventricular hypertrophy. Left ventricular diastolic parameters are consistent with Grade I diastolic dysfunction (impaired relaxation). Right Ventricle: The right ventricular size is normal. No increase in right ventricular wall thickness. Right ventricular systolic function is normal. There is normal pulmonary artery systolic pressure. The tricuspid regurgitant velocity is 2.07 m/s, and  with an  assumed right atrial pressure of 3 mmHg, the estimated right ventricular systolic pressure is [id number]. Left Atrium: Left atrial size was normal in size. Right Atrium: Right atrial size was normal in size. Pericardium: Trivial pericardial effusion is present. Mitral Valve: The mitral valve is normal in structure. Normal mobility of the mitral valve leaflets. No evidence of mitral valve regurgitation. No evidence of mitral valve stenosis. Tricuspid Valve: The tricuspid valve is normal in structure. Tricuspid valve regurgitation is trivial. No evidence of tricuspid stenosis. Aortic Valve: The aortic valve is normal in structure. Aortic valve regurgitation is not visualized. No aortic stenosis is present. Pulmonic Valve: The pulmonic valve was normal in structure. Pulmonic valve regurgitation is not visualized. No evidence of pulmonic stenosis. Aorta: The aortic root is normal in size and structure. Venous: The inferior vena cava is normal in size with greater than 50% respiratory variability, suggesting right atrial pressure of 3 mmHg. IAS/Shunts: No atrial level shunt detected by color flow Doppler.  LEFT VENTRICLE PLAX 2D LVIDd:         4.07 cm  Diastology LVIDs:         2.36 cm  LV e' lateral:   9.46 cm/s LV PW:  1.28 cm  LV E/e' lateral: 7.3 LV IVS:        1.01 cm  LV e' medial:    8.16 cm/s LVOT diam:     2.00 cm  LV E/e' medial:  8.4 LV SV:         55 LV SV Index:   28 LVOT Area:     3.14 cm  RIGHT VENTRICLE RV S prime:     18.60 cm/s TAPSE (M-mode): 1.4 cm LEFT ATRIUM             Index       RIGHT ATRIUM           Index LA diam:        3.20 cm 1.64 cm/m  RA Area:     10.60 cm LA Vol (A2C):   21.4 ml 10.96 ml/m RA Volume:   19.60 ml  10.04 ml/m LA Vol (A4C):   37.3 ml 19.11 ml/m LA Biplane Vol: 31.1 ml 15.93 ml/m  AORTIC VALVE LVOT Vmax:   117.00 cm/s LVOT Vmean:  67.500 cm/s LVOT VTI:    0.176 m  AORTA Ao Root diam: 2.60 cm MITRAL VALVE               TRICUSPID VALVE MV Area (PHT): 5.42 cm    TR  Peak grad:   17.1 mmHg MV Decel Time: 140 msec    TR Vmax:        207.00 cm/s MV E velocity: 68.90 cm/s MV A velocity: 82.00 cm/s  SHUNTS MV E/A ratio:  0.84        Systemic VTI:  0.18 m                            Systemic Diam: 2.00 cm Skeet Latch MD Electronically signed by Skeet Latch MD Signature Date/Time: 11/29/2019/6:13:07 PM    Final    VAS [place] LOWER EXTREMITY VENOUS (DVT)  Result Date: 11/29/2019  Lower Venous DVTStudy Indications: Pulmonary embolism.  Comparison Study: no prior Performing Technologist: Abram Sander RVS  Examination Guidelines: A complete evaluation includes B-mode imaging, spectral Doppler, color Doppler, and power Doppler as needed of all accessible portions of each vessel. Bilateral testing is considered an integral part of a complete examination. Limited examinations for reoccurring indications may be performed as noted. The reflux portion of the exam is performed with the patient in reverse Trendelenburg.  +---------+---------------+---------+-----------+----------+--------------+ RIGHT    CompressibilityPhasicitySpontaneityPropertiesThrombus Aging +---------+---------------+---------+-----------+----------+--------------+ CFV      Full           Yes      Yes                                 +---------+---------------+---------+-----------+----------+--------------+ SFJ      Full                                                        +---------+---------------+---------+-----------+----------+--------------+ FV Prox  Full                                                        +---------+---------------+---------+-----------+----------+--------------+  FV Mid   Full                                                        +---------+---------------+---------+-----------+----------+--------------+ FV DistalFull                                                         +---------+---------------+---------+-----------+----------+--------------+ PFV      Full                                                        +---------+---------------+---------+-----------+----------+--------------+ POP      Full           Yes      Yes                                 +---------+---------------+---------+-----------+----------+--------------+ PTV      Full                                                        +---------+---------------+---------+-----------+----------+--------------+ PERO     Full                                                        +---------+---------------+---------+-----------+----------+--------------+   +---------+---------------+---------+-----------+----------+--------------+ LEFT     CompressibilityPhasicitySpontaneityPropertiesThrombus Aging +---------+---------------+---------+-----------+----------+--------------+ CFV      Full           Yes      Yes                                 +---------+---------------+---------+-----------+----------+--------------+ SFJ      Full                                                        +---------+---------------+---------+-----------+----------+--------------+ FV Prox  Full                                                        +---------+---------------+---------+-----------+----------+--------------+ FV Mid   Full                                                        +---------+---------------+---------+-----------+----------+--------------+  FV DistalFull                                                        +---------+---------------+---------+-----------+----------+--------------+ PFV      Full                                                        +---------+---------------+---------+-----------+----------+--------------+ POP      Full           Yes      Yes                                  +---------+---------------+---------+-----------+----------+--------------+ PTV      Full                                                        +---------+---------------+---------+-----------+----------+--------------+ PERO                                                  Not visualized +---------+---------------+---------+-----------+----------+--------------+ Gastroc  None                                         Acute          +---------+---------------+---------+-----------+----------+--------------+     Summary: RIGHT: - There is no evidence of deep vein thrombosis in the lower extremity.  - No cystic structure found in the popliteal fossa.  LEFT: - Findings consistent with acute deep vein thrombosis involving the left gastrocnemius veins. - No cystic structure found in the popliteal fossa.  *See table(s) above for measurements and observations. Electronically signed by Curt Jews MD on 11/29/2019 at 4:25:09 PM.    Final    US Abdomen Limited RUQ  Result Date: 11/29/2019 CLINICAL DATA:  Right upper quadrant pain 3 months. EXAM: ULTRASOUND ABDOMEN LIMITED RIGHT UPPER QUADRANT COMPARISON:  05/08/2007 FINDINGS: Gallbladder: Significant cholelithiasis with largest stone measuring 1.1 cm. Gallbladder wall thickening measuring 5.5 mm. Negative sonographic Murphy sign. No adjacent free fluid. Common bile duct: Diameter: 5.7 mm. Liver: Heterogeneous parenchymal echogenicity with numerous liver masses many of which have a more hyperechoic periphery with more hypoechoic appearance centrally suggesting a somewhat target appearance. There is a large irregular shape cystic lesion with echogenic periphery over the right lobe measuring approximately 7.3 cm. Portal vein is patent on color Doppler imaging with normal direction of blood flow towards the liver. Other: None. IMPRESSION: 1. Moderate to severe cholelithiasis with mild gallbladder wall thickening of 5.5 mm. Recommend clinical correlation as  findings could be seen with acute cholecystitis. 2. Numerous liver masses with the largest over the right lobe measuring 7.3 cm containing central cystic/necrotic area. Findings are highly suspicious for metastatic disease.  Recommend clinical correlation as further evaluation with chest/abdominal/pelvic CT may be helpful to search for primary malignancy. Electronically Signed   By: Marin Olp M.D.   On: 11/29/2019 09:11    ASSESSMENT AND PLAN: 1.  Metastatic colorectal adenocarcinoma 2.  Bilateral PE 3.  Newly diagnosed HIV 4.  Iron deficiency anemia  -As previously outlined by Dr. Marin Olp, will arrange for outpatient follow-up for chemotherapy likely with FOLFOXIRI.  We will contact the patient with the date and time of her appointments. -The patient will discharge to home with Lovenox for her new diagnosis of bilateral PE.  May consider transitioning her over to [place] at some point down the road. -She has outpatient follow-up scheduled with ID for her new diagnosis of HIV. -Discussed with hospitalist who is planning to discharge home later today once Port-A-Cath has been placed.   LOS: 7 days   Mikey Bussing, DNP, AGPCNP-BC, AOCNP 12/06/19

## 2019-12-06 NOTE — Progress Notes (Signed)
Patient returned from IR, dressing to right chest CDI.  Alert and oriented, reviewed post procedure instructions.

## 2019-12-06 NOTE — Discharge Summary (Signed)
Physician Discharge Summary  Anita Hoffman [id number] DOB: Jun 30, 1961 DOA: 11/28/2019  PCP: Patient, No Pcp Per  Admit date: 11/28/2019 Discharge date: 12/06/2019  Admitted From: Home Disposition: Home  Recommendations for Outpatient Follow-up:  1. Follow up with PCP in 1-2 weeks 2. Please obtain BMP/CBC in one week 3. Please follow up on the following pending results:  Discharge Condition: Stable CODE STATUS: Full Diet recommendation: As tolerated  Brief/Interim Summary: 59 year old with history of tobacco use presented to the hospital with worsening abdominal pain and shortness of breath for 3 weeks.  Also reported of weight loss.  Right upper quadrant ultrasound showed moderate to severe cholelithiasis with gallbladder wall thickening, numerous liver masses.  CT angio showed bilateral PE.  CT abdomen showed large cecal mass.  GI and general surgery were consulted.  Underwent colonoscopy on 5/1 showing obstructive malignant tumor in the cecum area with some polyps.  Biopsy performed eventually resulted invasive adenocarcinoma.  General surgery and oncology consulted  Given no obstructive process despite adenocarcinoma surgery deferred any intervention at this time which is certainly reasonable.  Oncology continues to follow, biopsy consistent with adenocarcinoma.  Port to be placed 12/06/2019 for ultimate chemotherapy per oncology recommendations.  Close follow-up with surgery in the outpatient setting given elevated CEA, and possible need for surgical intervention after chemotherapy has been initiated.  Defer to both surgery and oncology for their expertise in this area.  To have microcytic anemia likely in the setting of iron deficiency anemia with possible small chronic bleeding due to cecal mass.  Continue p.o. iron at discharge.  ID following along given HIV positive testing with CD4 count around 500 viral load at 6000 started on Biktarvy.  Incidentally noted cholelithiasis imaging  without acute cholecystitis, follow clinically.  Patient has been referred to local PCP for close monitoring will is will need close follow-up with oncology and surgery in the near future for ongoing evaluation and treatment.  Patient otherwise stable and agreeable for discharge home.  Discharge Diagnoses:  Principal Problem:   Bilateral pulmonary embolism (HCC) Active Problems:   Cecal neoplasm   Liver metastases (HCC)   Elevated troponin   RUQ abdominal pain   Adenomatous polyp of ascending colon   Adenomatous polyp of sigmoid colon   Metastatic malignant neoplasm (HCC)   HIV disease ([place])    Discharge Instructions  Discharge Instructions    Call MD for:  difficulty breathing, headache or visual disturbances   Complete by: As directed    Call MD for:  extreme fatigue   Complete by: As directed    Call MD for:  hives   Complete by: As directed    Call MD for:  persistant dizziness or light-headedness   Complete by: As directed    Call MD for:  persistant nausea and vomiting   Complete by: As directed    Call MD for:  redness, tenderness, or signs of infection (pain, swelling, redness, odor or green/yellow discharge around incision site)   Complete by: As directed    Call MD for:  severe uncontrolled pain   Complete by: As directed    Call MD for:  temperature >100.4   Complete by: As directed    Diet - low sodium heart healthy   Complete by: As directed    Increase activity slowly   Complete by: As directed      Allergies as of 12/06/2019   No Known Allergies     Medication List    TAKE these  medications   Biktarvy 50-200-25 MG Tabs tablet Generic drug: bictegravir-emtricitabine-tenofovir AF Take 1 tablet by mouth daily.   enoxaparin 80 MG/0.8ML injection Commonly known as: LOVENOX Inject 0.8 mLs (80 mg total) into the skin 2 (two) times daily.      Follow-up Information    Elsie Stain, MD Follow up on 12/18/2019.   Specialty: Pulmonary Disease Why: @  3:00 pm for hospital follow up appointment and primary care provider to be established. Onsite pharmacy where medications range in cost from $4.00-$10.00 Contact information: [address] [place] [address] [phone]          No Known Allergies  Consultations:  Oncology, Dr. Marin Olp; gen surgery, Dr. Donne Hazel  Procedures/Studies: DG Chest 2 View  Result Date: 11/28/2019 CLINICAL DATA:  59 year old female with history of chest and abdominal pain for the past 2 weeks. EXAM: CHEST - 2 VIEW COMPARISON:  No priors. FINDINGS: Lung volumes are normal. No consolidative airspace disease. No pleural effusions. No pneumothorax. No pulmonary nodule or mass noted. Pulmonary vasculature and the cardiomediastinal silhouette are within normal limits. IMPRESSION: No radiographic evidence of acute cardiopulmonary disease. Electronically Signed   By: Vinnie Langton M.D.   On: 11/28/2019 13:46   CT Angio Chest PE W/Cm &/Or Wo Cm  Result Date: 11/29/2019 CLINICAL DATA:  RIGHT upper quadrant and RIGHT lower chest pain, shortness of breath, lower extremity edema, former smoker EXAM: CT ANGIOGRAPHY CHEST CT ABDOMEN AND PELVIS WITH CONTRAST TECHNIQUE: Multidetector CT imaging of the chest was performed using the standard protocol during bolus administration of intravenous contrast. Multiplanar CT image reconstructions and MIPs were obtained to evaluate the vascular anatomy. Multidetector CT imaging of the abdomen and pelvis was performed using the standard protocol during bolus administration of intravenous contrast. CONTRAST:  111mL OMNIPAQUE IOHEXOL 300 MG/ML SOLN IV. No oral contrast. COMPARISON:  CT abdomen and pelvis 11/22/2006 FINDINGS: CTA CHEST FINDINGS Cardiovascular: Aorta normal caliber without aneurysm or dissection. Pulmonary arteries well opacified. Filling defects are seen within the pulmonary arteries bilaterally RIGHT greater than LEFT consistent with pulmonary emboli. These are  greatest in the RIGHT lower lobe but affect all remaining lobes as well. RV/LV ratio = 1.0, elevated, consistent with RIGHT heart strain. Mediastinum/Nodes: Base of cervical region normal appearance. Normal sized axillary nodes bilaterally. No thoracic adenopathy. Esophagus unremarkable. Lungs/Pleura: Linear subsegmental atelectasis LEFT lower lobe. Lungs otherwise clear. No pulmonary infiltrate, pleural effusion, or pneumothorax. Musculoskeletal: No acute osseous findings. Review of the MIP images confirms the above findings. CT ABDOMEN and PELVIS FINDINGS Hepatobiliary: Multiple poorly defined mass lesions are seen throughout the liver consistent with extensive hepatic metastatic disease. Largest of these measure 6.6 x 4.2 cm lateral segment LEFT lobe image 43, 5.7 x 5.7 cm posterior RIGHT lobe image 34, anterior RIGHT lobe 7.2 x 5.0 cm image 33, and 7.6 x 7.1 cm anterior RIGHT lobe image 22. Gallbladder unremarkable. Pancreas: Normal appearance Spleen: Normal appearance Adrenals/Urinary Tract: Adrenal glands normal appearance. BILATERAL renal cysts. Kidneys, ureters, and bladder otherwise normal appearance. Stomach/Bowel: Normal appendix. Large mass identified at cecum extending into ileocecal valve region and terminal ileum measuring 5.6 x 4.0 x 4.5 cm consistent with colonic neoplasm. Thickening of terminal ileum without obstruction. Stomach decompressed. Remaining large and small bowel loops unremarkable. Vascular/Lymphatic: Minimal atherosclerotic calcification aorta. Aorta normal caliber. Few pelvic phleboliths. Mesenteric mass with minimal calcification 4.1 x 2.8 cm image 54 likely nodal metastasis. Additional enlarged pathologic node mesentery medial to cecal tip 1.7 x 1.5 cm. Normal  sized inguinal lymph nodes. Reproductive: Uterus and adnexa unremarkable Other: Tiny umbilical hernia containing fat. No free air or free fluid. Musculoskeletal: Bones demineralized. Lipoma involving the distal RIGHT iliopsoas  muscle/tendon, 3.6 x 2.5 cm x 8.9 cm length. Review of the MIP images confirms the above findings. IMPRESSION: Multiple BILATERAL pulmonary emboli RIGHT greater than LEFT. Positive for acute PE with CT evidence of right heart strain (RV/LV Ratio = 1.0) consistent with at least submassive (intermediate risk) PE; the presence of right heart strain has been associated with an increased risk of morbidity and mortality. Large cecal mass extending into ileocecal valve region and terminal ileum measuring 5.6 x 4.0 x 4.5 cm consistent with colonic neoplasm. Multiple hepatic metastases. Mesenteric nodal metastases. Aortic Atherosclerosis (ICD10-I70.0). Critical Value/emergent results were called by telephone at the time of interpretation on 11/29/2019 at 12:24 pm to provider DR. ELIZABETH REES , who verbally acknowledged these results. Electronically Signed   By: Lavonia Dana M.D.   On: 11/29/2019 12:27   CT Abdomen Pelvis W Contrast  Result Date: 11/29/2019 CLINICAL DATA:  RIGHT upper quadrant and RIGHT lower chest pain, shortness of breath, lower extremity edema, former smoker EXAM: CT ANGIOGRAPHY CHEST CT ABDOMEN AND PELVIS WITH CONTRAST TECHNIQUE: Multidetector CT imaging of the chest was performed using the standard protocol during bolus administration of intravenous contrast. Multiplanar CT image reconstructions and MIPs were obtained to evaluate the vascular anatomy. Multidetector CT imaging of the abdomen and pelvis was performed using the standard protocol during bolus administration of intravenous contrast. CONTRAST:  138mL OMNIPAQUE IOHEXOL 300 MG/ML SOLN IV. No oral contrast. COMPARISON:  CT abdomen and pelvis 11/22/2006 FINDINGS: CTA CHEST FINDINGS Cardiovascular: Aorta normal caliber without aneurysm or dissection. Pulmonary arteries well opacified. Filling defects are seen within the pulmonary arteries bilaterally RIGHT greater than LEFT consistent with pulmonary emboli. These are greatest in the RIGHT lower  lobe but affect all remaining lobes as well. RV/LV ratio = 1.0, elevated, consistent with RIGHT heart strain. Mediastinum/Nodes: Base of cervical region normal appearance. Normal sized axillary nodes bilaterally. No thoracic adenopathy. Esophagus unremarkable. Lungs/Pleura: Linear subsegmental atelectasis LEFT lower lobe. Lungs otherwise clear. No pulmonary infiltrate, pleural effusion, or pneumothorax. Musculoskeletal: No acute osseous findings. Review of the MIP images confirms the above findings. CT ABDOMEN and PELVIS FINDINGS Hepatobiliary: Multiple poorly defined mass lesions are seen throughout the liver consistent with extensive hepatic metastatic disease. Largest of these measure 6.6 x 4.2 cm lateral segment LEFT lobe image 43, 5.7 x 5.7 cm posterior RIGHT lobe image 34, anterior RIGHT lobe 7.2 x 5.0 cm image 33, and 7.6 x 7.1 cm anterior RIGHT lobe image 22. Gallbladder unremarkable. Pancreas: Normal appearance Spleen: Normal appearance Adrenals/Urinary Tract: Adrenal glands normal appearance. BILATERAL renal cysts. Kidneys, ureters, and bladder otherwise normal appearance. Stomach/Bowel: Normal appendix. Large mass identified at cecum extending into ileocecal valve region and terminal ileum measuring 5.6 x 4.0 x 4.5 cm consistent with colonic neoplasm. Thickening of terminal ileum without obstruction. Stomach decompressed. Remaining large and small bowel loops unremarkable. Vascular/Lymphatic: Minimal atherosclerotic calcification aorta. Aorta normal caliber. Few pelvic phleboliths. Mesenteric mass with minimal calcification 4.1 x 2.8 cm image 54 likely nodal metastasis. Additional enlarged pathologic node mesentery medial to cecal tip 1.7 x 1.5 cm. Normal sized inguinal lymph nodes. Reproductive: Uterus and adnexa unremarkable Other: Tiny umbilical hernia containing fat. No free air or free fluid. Musculoskeletal: Bones demineralized. Lipoma involving the distal RIGHT iliopsoas muscle/tendon, 3.6 x 2.5 cm  x 8.9 cm length. Review of  the MIP images confirms the above findings. IMPRESSION: Multiple BILATERAL pulmonary emboli RIGHT greater than LEFT. Positive for acute PE with CT evidence of right heart strain (RV/LV Ratio = 1.0) consistent with at least submassive (intermediate risk) PE; the presence of right heart strain has been associated with an increased risk of morbidity and mortality. Large cecal mass extending into ileocecal valve region and terminal ileum measuring 5.6 x 4.0 x 4.5 cm consistent with colonic neoplasm. Multiple hepatic metastases. Mesenteric nodal metastases. Aortic Atherosclerosis (ICD10-I70.0). Critical Value/emergent results were called by telephone at the time of interpretation on 11/29/2019 at 12:24 pm to provider DR. ELIZABETH REES , who verbally acknowledged these results. Electronically Signed   By: Lavonia Dana M.D.   On: 11/29/2019 12:27   ECHOCARDIOGRAM COMPLETE  Result Date: 11/29/2019    ECHOCARDIOGRAM REPORT   Patient Name:   Kristie Cowman Date of Exam: 11/29/2019 Medical Rec #:  [id number]         Height:       62.0 in Accession #:    [id number]        Weight:       210.0 lb Date of Birth:  11-02-1960         BSA:          1.952 m Patient Age:    59 years          BP:           133/77 mmHg Patient Gender: F                 HR:           97 bpm. Exam Location:  Inpatient Procedure: 2D Echo Indications:    Pulmonary Embolus I26.99  History:        Patient has no prior history of Echocardiogram examinations.                 Risk Factors:Former Smoker.  Sonographer:    Mikki Santee RDCS (AE) Referring Phys: [id number] Michell Heinrich PAHWANI IMPRESSIONS  1. Left ventricular ejection fraction, by estimation, is 60 to 65%. The left ventricle has normal function. The left ventricle has no regional wall motion abnormalities. There is mild concentric left ventricular hypertrophy. Left ventricular diastolic parameters are consistent with Grade I diastolic dysfunction (impaired relaxation).  2.  Right ventricular systolic function is normal. The right ventricular size is normal. There is normal pulmonary artery systolic pressure.  3. The mitral valve is normal in structure. No evidence of mitral valve regurgitation. No evidence of mitral stenosis.  4. The aortic valve is normal in structure. Aortic valve regurgitation is not visualized. No aortic stenosis is present.  5. The inferior vena cava is normal in size with greater than 50% respiratory variability, suggesting right atrial pressure of 3 mmHg. FINDINGS  Left Ventricle: Left ventricular ejection fraction, by estimation, is 60 to 65%. The left ventricle has normal function. The left ventricle has no regional wall motion abnormalities. The left ventricular internal cavity size was normal in size. There is  mild concentric left ventricular hypertrophy. Left ventricular diastolic parameters are consistent with Grade I diastolic dysfunction (impaired relaxation). Right Ventricle: The right ventricular size is normal. No increase in right ventricular wall thickness. Right ventricular systolic function is normal. There is normal pulmonary artery systolic pressure. The tricuspid regurgitant velocity is 2.07 m/s, and  with an assumed right atrial pressure of 3 mmHg, the estimated right ventricular systolic pressure is [id number] mmHg. Left Atrium:  Left atrial size was normal in size. Right Atrium: Right atrial size was normal in size. Pericardium: Trivial pericardial effusion is present. Mitral Valve: The mitral valve is normal in structure. Normal mobility of the mitral valve leaflets. No evidence of mitral valve regurgitation. No evidence of mitral valve stenosis. Tricuspid Valve: The tricuspid valve is normal in structure. Tricuspid valve regurgitation is trivial. No evidence of tricuspid stenosis. Aortic Valve: The aortic valve is normal in structure. Aortic valve regurgitation is not visualized. No aortic stenosis is present. Pulmonic Valve: The pulmonic valve  was normal in structure. Pulmonic valve regurgitation is not visualized. No evidence of pulmonic stenosis. Aorta: The aortic root is normal in size and structure. Venous: The inferior vena cava is normal in size with greater than 50% respiratory variability, suggesting right atrial pressure of 3 mmHg. IAS/Shunts: No atrial level shunt detected by color flow Doppler.  LEFT VENTRICLE PLAX 2D LVIDd:         4.07 cm  Diastology LVIDs:         2.36 cm  LV e' lateral:   9.46 cm/s LV PW:         1.28 cm  LV E/e' lateral: 7.3 LV IVS:        1.01 cm  LV e' medial:    8.16 cm/s LVOT diam:     2.00 cm  LV E/e' medial:  8.4 LV SV:         55 LV SV Index:   28 LVOT Area:     3.14 cm  RIGHT VENTRICLE RV S prime:     18.60 cm/s TAPSE (M-mode): 1.4 cm LEFT ATRIUM             Index       RIGHT ATRIUM           Index LA diam:        3.20 cm 1.64 cm/m  RA Area:     10.60 cm LA Vol (A2C):   21.4 ml 10.96 ml/m RA Volume:   19.60 ml  10.04 ml/m LA Vol (A4C):   37.3 ml 19.11 ml/m LA Biplane Vol: 31.1 ml 15.93 ml/m  AORTIC VALVE LVOT Vmax:   117.00 cm/s LVOT Vmean:  67.500 cm/s LVOT VTI:    0.176 m  AORTA Ao Root diam: 2.60 cm MITRAL VALVE               TRICUSPID VALVE MV Area (PHT): 5.42 cm    TR Peak grad:   17.1 mmHg MV Decel Time: 140 msec    TR Vmax:        207.00 cm/s MV E velocity: 68.90 cm/s MV A velocity: 82.00 cm/s  SHUNTS MV E/A ratio:  0.84        Systemic VTI:  0.18 m                            Systemic Diam: 2.00 cm Skeet Latch MD Electronically signed by Skeet Latch MD Signature Date/Time: 11/29/2019/6:13:07 PM    Final    VAS [place] LOWER EXTREMITY VENOUS (DVT)  Result Date: 11/29/2019  Lower Venous DVTStudy Indications: Pulmonary embolism.  Comparison Study: no prior Performing Technologist: Abram Sander RVS  Examination Guidelines: A complete evaluation includes B-mode imaging, spectral Doppler, color Doppler, and power Doppler as needed of all accessible portions of each vessel. Bilateral testing is  considered an integral part of a complete examination. Limited examinations for reoccurring indications  may be performed as noted. The reflux portion of the exam is performed with the patient in reverse Trendelenburg.  +---------+---------------+---------+-----------+----------+--------------+ RIGHT    CompressibilityPhasicitySpontaneityPropertiesThrombus Aging +---------+---------------+---------+-----------+----------+--------------+ CFV      Full           Yes      Yes                                 +---------+---------------+---------+-----------+----------+--------------+ SFJ      Full                                                        +---------+---------------+---------+-----------+----------+--------------+ FV Prox  Full                                                        +---------+---------------+---------+-----------+----------+--------------+ FV Mid   Full                                                        +---------+---------------+---------+-----------+----------+--------------+ FV DistalFull                                                        +---------+---------------+---------+-----------+----------+--------------+ PFV      Full                                                        +---------+---------------+---------+-----------+----------+--------------+ POP      Full           Yes      Yes                                 +---------+---------------+---------+-----------+----------+--------------+ PTV      Full                                                        +---------+---------------+---------+-----------+----------+--------------+ PERO     Full                                                        +---------+---------------+---------+-----------+----------+--------------+   +---------+---------------+---------+-----------+----------+--------------+ LEFT      CompressibilityPhasicitySpontaneityPropertiesThrombus Aging +---------+---------------+---------+-----------+----------+--------------+ CFV      Full  Yes      Yes                                 +---------+---------------+---------+-----------+----------+--------------+ SFJ      Full                                                        +---------+---------------+---------+-----------+----------+--------------+ FV Prox  Full                                                        +---------+---------------+---------+-----------+----------+--------------+ FV Mid   Full                                                        +---------+---------------+---------+-----------+----------+--------------+ FV DistalFull                                                        +---------+---------------+---------+-----------+----------+--------------+ PFV      Full                                                        +---------+---------------+---------+-----------+----------+--------------+ POP      Full           Yes      Yes                                 +---------+---------------+---------+-----------+----------+--------------+ PTV      Full                                                        +---------+---------------+---------+-----------+----------+--------------+ PERO                                                  Not visualized +---------+---------------+---------+-----------+----------+--------------+ Gastroc  None                                         Acute          +---------+---------------+---------+-----------+----------+--------------+     Summary: RIGHT: - There is no evidence of deep vein thrombosis in the lower extremity.  - No cystic structure found in the popliteal  fossa.  LEFT: - Findings consistent with acute deep vein thrombosis involving the left gastrocnemius veins. - No cystic structure found in the  popliteal fossa.  *See table(s) above for measurements and observations. Electronically signed by Curt Jews MD on 11/29/2019 at 4:25:09 PM.    Final    US Abdomen Limited RUQ  Result Date: 11/29/2019 CLINICAL DATA:  Right upper quadrant pain 3 months. EXAM: ULTRASOUND ABDOMEN LIMITED RIGHT UPPER QUADRANT COMPARISON:  05/08/2007 FINDINGS: Gallbladder: Significant cholelithiasis with largest stone measuring 1.1 cm. Gallbladder wall thickening measuring 5.5 mm. Negative sonographic Murphy sign. No adjacent free fluid. Common bile duct: Diameter: 5.7 mm. Liver: Heterogeneous parenchymal echogenicity with numerous liver masses many of which have a more hyperechoic periphery with more hypoechoic appearance centrally suggesting a somewhat target appearance. There is a large irregular shape cystic lesion with echogenic periphery over the right lobe measuring approximately 7.3 cm. Portal vein is patent on color Doppler imaging with normal direction of blood flow towards the liver. Other: None. IMPRESSION: 1. Moderate to severe cholelithiasis with mild gallbladder wall thickening of 5.5 mm. Recommend clinical correlation as findings could be seen with acute cholecystitis. 2. Numerous liver masses with the largest over the right lobe measuring 7.3 cm containing central cystic/necrotic area. Findings are highly suspicious for metastatic disease. Recommend clinical correlation as further evaluation with chest/abdominal/pelvic CT may be helpful to search for primary malignancy. Electronically Signed   By: Marin Olp M.D.   On: 11/29/2019 09:11     Subjective: No acute issues or events overnight, tolerated port catheter placement without issue, otherwise denies nausea, vomiting, diarrhea, constipation, headache, fevers, chills.  Discharge Exam: Vitals:   12/06/19 1215 12/06/19 1234  BP: 109/77 106/76  Pulse: (!) 101 94  Resp: 20 20  Temp:    SpO2: 97% 97%   Vitals:   12/06/19 1205 12/06/19 1211 12/06/19 1215  12/06/19 1234  BP: (!) 89/57 111/75 109/77 106/76  Pulse: (!) 106 (!) 105 (!) 101 94  Resp: 19 18 20 20   Temp:      TempSrc:      SpO2: 97% 97% 97% 97%  Weight:      Height:        General: Pt is alert, awake, not in acute distress Cardiovascular: RRR, S1/S2 +, no rubs, no gallops Respiratory: CTA bilaterally, no wheezing, no rhonchi Abdominal: Soft, NT, ND, bowel sounds + Extremities: no edema, no cyanosis   The results of significant diagnostics from this hospitalization (including imaging, microbiology, ancillary and laboratory) are listed below for reference.     Microbiology: Recent Results (from the past 240 hour(s))  Respiratory Panel by RT PCR (Flu A&B, Covid) - Nasopharyngeal Swab     Status: None   Collection Time: 11/29/19 10:28 AM   Specimen: Nasopharyngeal Swab  Result Value Ref Range Status   SARS Coronavirus 2 by RT PCR NEGATIVE NEGATIVE Final    Comment: (NOTE) SARS-CoV-2 target nucleic acids are NOT DETECTED. The SARS-CoV-2 RNA is generally detectable in upper respiratoy specimens during the acute phase of infection. The lowest concentration of SARS-CoV-2 viral copies this assay can detect is 131 copies/mL. A negative result does not preclude SARS-Cov-2 infection and should not be used as the sole basis for treatment or other patient management decisions. A negative result may occur with  improper specimen collection/handling, submission of specimen other than nasopharyngeal swab, presence of viral mutation(s) within the areas targeted by this assay, and inadequate number of viral copies (<131 copies/mL). A negative  result must be combined with clinical observations, patient history, and epidemiological information. The expected result is Negative. Fact Sheet for Patients:  PinkCheek.be Fact Sheet for Healthcare Providers:  GravelBags.it This test is not yet ap proved or cleared by the [place] FDA and  has been authorized for detection and/or diagnosis of SARS-CoV-2 by FDA under an Emergency Use Authorization (EUA). This EUA will remain  in effect (meaning this test can be used) for the duration of the COVID-19 declaration under Section 564(b)(1) of the Act, 21 U.S.C. section 360bbb-3(b)(1), unless the authorization is terminated or revoked sooner.    Influenza A by PCR NEGATIVE NEGATIVE Final   Influenza B by PCR NEGATIVE NEGATIVE Final    Comment: (NOTE) The Xpert Xpress SARS-CoV-2/FLU/RSV assay is intended as an aid in  the diagnosis of influenza from Nasopharyngeal swab specimens and  should not be used as a sole basis for treatment. Nasal washings and  aspirates are unacceptable for Xpert Xpress SARS-CoV-2/FLU/RSV  testing. Fact Sheet for Patients: PinkCheek.be Fact Sheet for Healthcare Providers: GravelBags.it This test is not yet approved or cleared by the [place] FDA and  has been authorized for detection and/or diagnosis of SARS-CoV-2 by  FDA under an Emergency Use Authorization (EUA). This EUA will remain  in effect (meaning this test can be used) for the duration of the  Covid-19 declaration under Section 564(b)(1) of the Act, 21  U.S.C. section 360bbb-3(b)(1), unless the authorization is  terminated or revoked. Performed at [facility], [place] [address] [place], [place] [address]      Labs: BNP (last 3 results) No results for input(s): BNP in the last 8760 hours. Basic Metabolic Panel: Recent Labs  Lab 12/01/19 0334 12/02/19 0502 12/03/19 0411 12/04/19 0359 12/05/19 0455  NA 134* 135 135 135 136  K 3.0* 3.2* 3.5 3.4* 4.3  CL 102 105 103 102 101  CO2 19* 22 24 27 24   GLUCOSE 91 97 99 104* 91  BUN <5* <5* <5* <5* <5*  CREATININE 0.55 0.57 0.55 0.57 0.58  CALCIUM 7.8* 7.8* 7.7* 8.0* 8.2*  MG 1.8 2.1 1.8 1.8 2.2   Liver Function Tests: Recent Labs  Lab 11/29/19 1400  11/30/19 0309  AST 56* 53*  ALT 21 19  ALKPHOS 176* 162*  BILITOT 1.2 0.9  PROT 7.2 6.3*  ALBUMIN 2.5* 2.1*   No results for input(s): LIPASE, AMYLASE in the last 168 hours. No results for input(s): AMMONIA in the last 168 hours. CBC: Recent Labs  Lab 11/29/19 1400 11/30/19 0309 12/01/19 0334 12/01/19 0334 12/02/19 0502 12/03/19 0411 12/03/19 2045 12/04/19 0359 12/05/19 0455  WBC 11.0*   < > 11.0*  --  9.5 9.7  --  9.8 9.8  NEUTROABS 7.5  --   --   --   --   --   --   --   --   HGB 8.4*   < > 7.4*   < > 7.6* 7.1* 8.5* 8.6* 8.7*  HCT 28.1*   < > 24.0*   < > 24.7* 23.2* 27.4* 27.4* 27.9*  MCV 80.7   < > 76.9*  --  76.9* 76.6*  --  79.7* 80.9  PLT 464*   < > 510*  --  475* 422*  --  575* 618*   < > = values in this interval not displayed.   Cardiac Enzymes: No results for input(s): CKTOTAL, CKMB, CKMBINDEX, TROPONINI in the last 168 hours. BNP: Invalid input(s): POCBNP CBG:  No results for input(s): GLUCAP in the last 168 hours. D-Dimer No results for input(s): DDIMER in the last 72 hours. Hgb A1c No results for input(s): HGBA1C in the last 72 hours. Lipid Profile No results for input(s): CHOL, HDL, LDLCALC, TRIG, CHOLHDL, LDLDIRECT in the last 72 hours. Thyroid function studies No results for input(s): TSH, T4TOTAL, T3FREE, THYROIDAB in the last 72 hours.  Invalid input(s): FREET3 Anemia work up No results for input(s): VITAMINB12, FOLATE, FERRITIN, TIBC, IRON, RETICCTPCT in the last 72 hours. Urinalysis    Component Value Date/Time   COLORURINE AMBER (A) 11/30/2019 0629   APPEARANCEUR HAZY (A) 11/30/2019 0629   LABSPEC >1.046 (H) 11/30/2019 0629   PHURINE 5.0 11/30/2019 0629   GLUCOSEU NEGATIVE 11/30/2019 0629   HGBUR NEGATIVE 11/30/2019 0629   BILIRUBINUR NEGATIVE 11/30/2019 0629   KETONESUR 20 (A) 11/30/2019 0629   PROTEINUR 30 (A) 11/30/2019 0629   NITRITE NEGATIVE 11/30/2019 0629   LEUKOCYTESUR NEGATIVE 11/30/2019 0629   Sepsis Labs Invalid input(s):  PROCALCITONIN,  WBC,  LACTICIDVEN Microbiology Recent Results (from the past 240 hour(s))  Respiratory Panel by RT PCR (Flu A&B, Covid) - Nasopharyngeal Swab     Status: None   Collection Time: 11/29/19 10:28 AM   Specimen: Nasopharyngeal Swab  Result Value Ref Range Status   SARS Coronavirus 2 by RT PCR NEGATIVE NEGATIVE Final    Comment: (NOTE) SARS-CoV-2 target nucleic acids are NOT DETECTED. The SARS-CoV-2 RNA is generally detectable in upper respiratoy specimens during the acute phase of infection. The lowest concentration of SARS-CoV-2 viral copies this assay can detect is 131 copies/mL. A negative result does not preclude SARS-Cov-2 infection and should not be used as the sole basis for treatment or other patient management decisions. A negative result may occur with  improper specimen collection/handling, submission of specimen other than nasopharyngeal swab, presence of viral mutation(s) within the areas targeted by this assay, and inadequate number of viral copies (<131 copies/mL). A negative result must be combined with clinical observations, patient history, and epidemiological information. The expected result is Negative. Fact Sheet for Patients:  PinkCheek.be Fact Sheet for Healthcare Providers:  GravelBags.it This test is not yet ap proved or cleared by the [place] FDA and  has been authorized for detection and/or diagnosis of SARS-CoV-2 by FDA under an Emergency Use Authorization (EUA). This EUA will remain  in effect (meaning this test can be used) for the duration of the COVID-19 declaration under Section 564(b)(1) of the Act, 21 U.S.C. section 360bbb-3(b)(1), unless the authorization is terminated or revoked sooner.    Influenza A by PCR NEGATIVE NEGATIVE Final   Influenza B by PCR NEGATIVE NEGATIVE Final    Comment: (NOTE) The Xpert Xpress SARS-CoV-2/FLU/RSV assay is intended as an aid in  the  diagnosis of influenza from Nasopharyngeal swab specimens and  should not be used as a sole basis for treatment. Nasal washings and  aspirates are unacceptable for Xpert Xpress SARS-CoV-2/FLU/RSV  testing. Fact Sheet for Patients: PinkCheek.be Fact Sheet for Healthcare Providers: GravelBags.it This test is not yet approved or cleared by the [place] FDA and  has been authorized for detection and/or diagnosis of SARS-CoV-2 by  FDA under an Emergency Use Authorization (EUA). This EUA will remain  in effect (meaning this test can be used) for the duration of the  Covid-19 declaration under Section 564(b)(1) of the Act, 21  U.S.C. section 360bbb-3(b)(1), unless the authorization is  terminated or revoked. Performed at [facility], [place]  [address] [place], [place] [address]    Time coordinating discharge: Over 30 minutes  SIGNED:  Little Ishikawa, DO [facility] 12/06/2019, 1:19 PM Pager   If 7PM-7AM, please contact night-coverage [email or web address]

## 2019-12-06 NOTE — Progress Notes (Signed)
START ON PATHWAY REGIMEN - Colorectal     A cycle is every 14 days:     Irinotecan      Oxaliplatin      Leucovorin      Fluorouracil   **Always confirm dose/schedule in your pharmacy ordering system**  Patient Characteristics: Distant Metastases, Nonsurgical Candidate, KRAS/NRAS Mutation Positive/Unknown (BRAF V600 Wild-Type/Unknown), Standard Cytotoxic Therapy, First Line Standard Cytotoxic Therapy, Bevacizumab Ineligible, PS = 0,1 Tumor Location: Colon Therapeutic Status: Distant Metastases Microsatellite/Mismatch Repair Status: Unknown BRAF Mutation Status: Awaiting Test Results KRAS/NRAS Mutation Status: Awaiting Test Results Standard Cytotoxic Line of Therapy: First Line Standard Cytotoxic Therapy ECOG Performance Status: 0 Bevacizumab Eligibility: Ineligible Intent of Therapy: Non-Curative / Palliative Intent, Discussed with Patient

## 2019-12-06 NOTE — Progress Notes (Signed)
Heparin gtt IV d/ced per IR for patient to have port placement later this morning.  Patient states she has been NPO since midnight.

## 2019-12-07 ENCOUNTER — Encounter: Payer: Self-pay | Admitting: *Deleted

## 2019-12-07 NOTE — Progress Notes (Addendum)
Attempted to call [place] twice. Both time call goes directly to error message. Called patient contact Anita Hoffman and left message on voice mail requesting she or patient call me back. After no success in reaching patient, called her sister, Anita Hoffman and spoke to her. She will reach out to patient and have her call me.  Anita Hoffman called back at 1300. Her number was incorrect. Number updated.   Reached out to Kristie Cowman to introduce myself as the office RN Navigator and explain our new patient process. Reviewed the reason for their referral and scheduled their new patient appointment along with labs. Provided address and directions to the office including call back phone number. Reviewed with patient any concerns they may have or any possible barriers to attending their appointment.   Informed patient about my role as a navigator and that I will meet with them prior to their New Patient appointment and more fully discuss what services I can provide. At this time patient has no further questions or needs.

## 2019-12-10 ENCOUNTER — Other Ambulatory Visit: Payer: Self-pay | Admitting: *Deleted

## 2019-12-10 DIAGNOSIS — D490 Neoplasm of unspecified behavior of digestive system: Secondary | ICD-10-CM

## 2019-12-10 DIAGNOSIS — C787 Secondary malignant neoplasm of liver and intrahepatic bile duct: Secondary | ICD-10-CM

## 2019-12-10 DIAGNOSIS — D122 Benign neoplasm of ascending colon: Secondary | ICD-10-CM

## 2019-12-10 MED ORDER — LIDOCAINE-PRILOCAINE 2.5-2.5 % EX CREA: TOPICAL_CREAM | CUTANEOUS | 3 refills | Status: DC

## 2019-12-10 MED ORDER — DEXAMETHASONE 4 MG PO TABS: 8.0000 mg | ORAL_TABLET | Freq: Every day | ORAL | 5 refills | Status: DC

## 2019-12-10 MED ORDER — ONDANSETRON HCL 8 MG PO TABS: 8.0000 mg | ORAL_TABLET | Freq: Two times a day (BID) | ORAL | 1 refills | Status: DC | PRN

## 2019-12-10 MED ORDER — PROCHLORPERAZINE MALEATE 10 MG PO TABS: 10.0000 mg | ORAL_TABLET | Freq: Four times a day (QID) | ORAL | 1 refills | Status: DC | PRN

## 2019-12-10 MED ORDER — LORAZEPAM 0.5 MG PO TABS: 0.5000 mg | ORAL_TABLET | Freq: Four times a day (QID) | ORAL | 0 refills | Status: DC | PRN

## 2019-12-10 MED ORDER — LOPERAMIDE HCL 2 MG PO TABS: ORAL_TABLET | ORAL | 1 refills | Status: DC

## 2019-12-10 NOTE — Progress Notes (Unsigned)
Pharmacist Chemotherapy Monitoring - Initial Assessment    Anticipated start date: 12/11/19   Regimen:  . Are orders appropriate based on the patient's diagnosis, regimen, and cycle? Yes . Does the plan date match the patient's scheduled date? Yes . Is the sequencing of drugs appropriate? Yes . Are the premedications appropriate for the patient's regimen? Yes . Prior Authorization for treatment is: Not Started o If applicable, is the correct biosimilar selected based on the patient's insurance? not applicable  Organ Function and Labs: Marland Kitchen Are dose adjustments needed based on the patient's renal function, hepatic function, or hematologic function? No . Are appropriate labs ordered prior to the start of patient's treatment? Yes . Other organ system assessment, if indicated: N/A . The following baseline labs, if indicated, have been ordered: N/A  Dose Assessment: . Are the drug doses appropriate? Yes  . Are the following correct: o Drug concentrations Yes o IV fluid compatible with drug Yes o Administration routes Yes o Timing of therapy Yes . If applicable, does the patient have documented access for treatment and/or plans for port-a-cath placement? yes . If applicable, have lifetime cumulative doses been properly documented and assessed? not applicable Lifetime Dose Tracking  No doses have been documented on this patient for the following tracked chemicals: Doxorubicin, Epirubicin, Idarubicin, Daunorubicin, Mitoxantrone, Bleomycin, Oxaliplatin, Carboplatin, Liposomal Doxorubicin  o   Toxicity Monitoring/Prevention: . The patient has the following take home antiemetics prescribed: Ondansetron, Prochlorperazine, Dexamethasone and Lorazepam . The patient has the following take home medications prescribed: N/A . Medication allergies and previous infusion related reactions, if applicable, have been reviewed and addressed. Yes . The patient's current medication list has been assessed for  drug-drug interactions with their chemotherapy regimen. no significant drug-drug interactions were identified on review.  Order Review: . Are the treatment plan orders signed? Yes . Is the patient scheduled to see a provider prior to their treatment? Yes  I verify that I have reviewed each item in the above checklist and answered each question accordingly.  Anita Hoffman, Jacqlyn Larsen 12/10/2019 11:56 AM

## 2019-12-11 ENCOUNTER — Other Ambulatory Visit: Payer: Self-pay

## 2019-12-11 ENCOUNTER — Ambulatory Visit: Payer: Self-pay

## 2019-12-11 ENCOUNTER — Encounter: Payer: Self-pay | Admitting: Nutrition

## 2019-12-11 ENCOUNTER — Ambulatory Visit: Payer: Self-pay | Admitting: Hematology & Oncology

## 2019-12-11 ENCOUNTER — Encounter: Payer: Self-pay | Admitting: *Deleted

## 2019-12-11 LAB — CEA: CEA: 17442 ng/mL — ABNORMAL HIGH (ref 0.0–4.7)

## 2019-12-11 NOTE — Progress Notes (Signed)
Patient is a no-show to todays appointment. Called this morning at 915 and left message on patient's voicemail. Called again at 1355 with still no answer.   Will continue to attempt patient contact.

## 2019-12-12 ENCOUNTER — Inpatient Hospital Stay: Payer: Self-pay

## 2019-12-12 ENCOUNTER — Inpatient Hospital Stay: Payer: Self-pay | Attending: Hematology & Oncology | Admitting: Hematology & Oncology

## 2019-12-12 ENCOUNTER — Encounter: Payer: Self-pay | Admitting: *Deleted

## 2019-12-12 ENCOUNTER — Other Ambulatory Visit: Payer: Self-pay | Admitting: *Deleted

## 2019-12-12 ENCOUNTER — Other Ambulatory Visit: Payer: Self-pay

## 2019-12-12 ENCOUNTER — Encounter: Payer: Self-pay | Admitting: Hematology & Oncology

## 2019-12-12 ENCOUNTER — Telehealth: Payer: Self-pay | Admitting: Hematology & Oncology

## 2019-12-12 VITALS — BP 107/68 | HR 97 | Temp 97.3°F | Resp 16 | Ht 62.0 in | Wt 166.5 lb

## 2019-12-12 DIAGNOSIS — C787 Secondary malignant neoplasm of liver and intrahepatic bile duct: Secondary | ICD-10-CM | POA: Insufficient documentation

## 2019-12-12 DIAGNOSIS — R778 Other specified abnormalities of plasma proteins: Secondary | ICD-10-CM

## 2019-12-12 DIAGNOSIS — Z7901 Long term (current) use of anticoagulants: Secondary | ICD-10-CM | POA: Insufficient documentation

## 2019-12-12 DIAGNOSIS — D490 Neoplasm of unspecified behavior of digestive system: Secondary | ICD-10-CM

## 2019-12-12 DIAGNOSIS — K922 Gastrointestinal hemorrhage, unspecified: Secondary | ICD-10-CM | POA: Insufficient documentation

## 2019-12-12 DIAGNOSIS — Z5111 Encounter for antineoplastic chemotherapy: Secondary | ICD-10-CM | POA: Insufficient documentation

## 2019-12-12 DIAGNOSIS — C180 Malignant neoplasm of cecum: Secondary | ICD-10-CM | POA: Insufficient documentation

## 2019-12-12 DIAGNOSIS — Z21 Asymptomatic human immunodeficiency virus [HIV] infection status: Secondary | ICD-10-CM | POA: Insufficient documentation

## 2019-12-12 DIAGNOSIS — Z79899 Other long term (current) drug therapy: Secondary | ICD-10-CM | POA: Insufficient documentation

## 2019-12-12 DIAGNOSIS — D500 Iron deficiency anemia secondary to blood loss (chronic): Secondary | ICD-10-CM

## 2019-12-12 DIAGNOSIS — I82462 Acute embolism and thrombosis of left calf muscular vein: Secondary | ICD-10-CM | POA: Insufficient documentation

## 2019-12-12 DIAGNOSIS — I2699 Other pulmonary embolism without acute cor pulmonale: Secondary | ICD-10-CM | POA: Insufficient documentation

## 2019-12-12 DIAGNOSIS — D122 Benign neoplasm of ascending colon: Secondary | ICD-10-CM

## 2019-12-12 HISTORY — DX: Iron deficiency anemia secondary to blood loss (chronic): D50.0

## 2019-12-12 LAB — CMP (CANCER CENTER ONLY)
ALT: 23 U/L (ref 0–44)
AST: 63 U/L — ABNORMAL HIGH (ref 15–41)
Albumin: 3.1 g/dL — ABNORMAL LOW (ref 3.5–5.0)
Alkaline Phosphatase: 254 U/L — ABNORMAL HIGH (ref 38–126)
Anion gap: 11 (ref 5–15)
BUN: 7 mg/dL (ref 6–20)
CO2: 26 mmol/L (ref 22–32)
Calcium: 8.4 mg/dL — ABNORMAL LOW (ref 8.9–10.3)
Chloride: 97 mmol/L — ABNORMAL LOW (ref 98–111)
Creatinine: 0.54 mg/dL (ref 0.44–1.00)
GFR, Est AFR Am: >60 mL/min (ref >60)
GFR, Estimated: >60 mL/min (ref >60)
Glucose, Bld: 105 mg/dL — ABNORMAL HIGH (ref 70–99)
Potassium: 3.4 mmol/L — ABNORMAL LOW (ref 3.5–5.1)
Sodium: 134 mmol/L — ABNORMAL LOW (ref 135–145)
Total Bilirubin: 0.7 mg/dL (ref 0.3–1.2)
Total Protein: 6.8 g/dL (ref 6.5–8.1)

## 2019-12-12 LAB — CBC WITH DIFFERENTIAL (CANCER CENTER ONLY)
Abs Immature Granulocytes: 0.06 10*3/uL (ref 0.00–0.07)
Basophils Absolute: 0.1 10*3/uL (ref 0.0–0.1)
Basophils Relative: 1 %
Eosinophils Absolute: 0 10*3/uL (ref 0.0–0.5)
Eosinophils Relative: 0 %
HCT: 29.5 % — ABNORMAL LOW (ref 36.0–46.0)
Hemoglobin: 9.2 g/dL — ABNORMAL LOW (ref 12.0–15.0)
Immature Granulocytes: 1 %
Lymphocytes Relative: 12 %
Lymphs Abs: 1.4 10*3/uL (ref 0.7–4.0)
MCH: 25.3 pg — ABNORMAL LOW (ref 26.0–34.0)
MCHC: 31.2 g/dL (ref 30.0–36.0)
MCV: 81 fL (ref 80.0–100.0)
Monocytes Absolute: 0.8 10*3/uL (ref 0.1–1.0)
Monocytes Relative: 8 %
Neutro Abs: 8.6 10*3/uL — ABNORMAL HIGH (ref 1.7–7.7)
Neutrophils Relative %: 78 %
Platelet Count: 491 10*3/uL — ABNORMAL HIGH (ref 150–400)
RBC: 3.64 MIL/uL — ABNORMAL LOW (ref 3.87–5.11)
RDW: 21.9 % — ABNORMAL HIGH (ref 11.5–15.5)
WBC Count: 10.9 10*3/uL — ABNORMAL HIGH (ref 4.0–10.5)
nRBC: 0 % (ref 0.0–0.2)

## 2019-12-12 MED ORDER — ACETAMINOPHEN 325 MG PO TABS
650.0000 mg | ORAL_TABLET | Freq: Once | ORAL | Status: AC
  Administered 2019-12-12: 650 mg via ORAL

## 2019-12-12 MED ORDER — DEXTROSE 5 % IV SOLN
Freq: Once | INTRAVENOUS | Status: AC
  Filled 2019-12-12: qty 250

## 2019-12-12 MED ORDER — SODIUM CHLORIDE 0.9 % IV SOLN
2880.0000 mg/m2 | INTRAVENOUS | Status: DC
  Administered 2019-12-12: 5300 mg via INTRAVENOUS
  Filled 2019-12-12: qty 106

## 2019-12-12 MED ORDER — SODIUM CHLORIDE 0.9 % IV SOLN
Freq: Once | INTRAVENOUS | Status: AC
  Filled 2019-12-12: qty 250

## 2019-12-12 MED ORDER — PALONOSETRON HCL INJECTION 0.25 MG/5ML
INTRAVENOUS | Status: AC
  Filled 2019-12-12: qty 5

## 2019-12-12 MED ORDER — LORAZEPAM 0.5 MG PO TABS: 0.5000 mg | ORAL_TABLET | Freq: Four times a day (QID) | ORAL | 0 refills | Status: DC | PRN

## 2019-12-12 MED ORDER — SODIUM CHLORIDE 0.9 % IV SOLN
10.0000 mg | Freq: Once | INTRAVENOUS | Status: AC
  Administered 2019-12-12: 10 mg via INTRAVENOUS
  Filled 2019-12-12: qty 10

## 2019-12-12 MED ORDER — OXALIPLATIN CHEMO INJECTION 100 MG/20ML
72.2500 mg/m2 | Freq: Once | INTRAVENOUS | Status: AC
  Administered 2019-12-12: 135 mg via INTRAVENOUS
  Filled 2019-12-12: qty 27

## 2019-12-12 MED ORDER — PALONOSETRON HCL INJECTION 0.25 MG/5ML
0.2500 mg | Freq: Once | INTRAVENOUS | Status: AC
  Administered 2019-12-12: 0.25 mg via INTRAVENOUS

## 2019-12-12 MED ORDER — FAMOTIDINE IN NACL 20-0.9 MG/50ML-% IV SOLN
INTRAVENOUS | Status: AC
  Filled 2019-12-12: qty 50

## 2019-12-12 MED ORDER — LIDOCAINE-PRILOCAINE 2.5-2.5 % EX CREA: TOPICAL_CREAM | CUTANEOUS | 3 refills | Status: DC

## 2019-12-12 MED ORDER — DEXAMETHASONE 4 MG PO TABS: 8.0000 mg | ORAL_TABLET | Freq: Every day | ORAL | 5 refills | Status: DC

## 2019-12-12 MED ORDER — LEUCOVORIN CALCIUM INJECTION 350 MG
200.0000 mg/m2 | Freq: Once | INTRAVENOUS | Status: AC
  Administered 2019-12-12: 368 mg via INTRAVENOUS
  Filled 2019-12-12: qty 18.4

## 2019-12-12 MED ORDER — FAMOTIDINE IN NACL 20-0.9 MG/50ML-% IV SOLN
20.0000 mg | Freq: Once | INTRAVENOUS | Status: AC
  Administered 2019-12-12: 20 mg via INTRAVENOUS

## 2019-12-12 MED ORDER — HEPARIN SOD (PORK) LOCK FLUSH 100 UNIT/ML IV SOLN
500.0000 [IU] | Freq: Once | INTRAVENOUS | Status: DC | PRN
  Filled 2019-12-12: qty 5

## 2019-12-12 MED ORDER — SODIUM CHLORIDE 0.9 % IV SOLN
150.0000 mg | Freq: Once | INTRAVENOUS | Status: AC
  Administered 2019-12-12: 150 mg via INTRAVENOUS
  Filled 2019-12-12: qty 150

## 2019-12-12 MED ORDER — ATROPINE SULFATE 1 MG/ML IJ SOLN
0.5000 mg | Freq: Once | INTRAMUSCULAR | Status: AC | PRN
  Administered 2019-12-12: 0.5 mg via INTRAVENOUS

## 2019-12-12 MED ORDER — SODIUM CHLORIDE 0.9 % IV SOLN
510.0000 mg | Freq: Once | INTRAVENOUS | Status: AC
  Administered 2019-12-12: 510 mg via INTRAVENOUS
  Filled 2019-12-12: qty 510

## 2019-12-12 MED ORDER — SODIUM CHLORIDE 0.9% FLUSH
10.0000 mL | INTRAVENOUS | Status: DC | PRN
  Filled 2019-12-12: qty 10

## 2019-12-12 MED ORDER — SODIUM CHLORIDE 0.9 % IV SOLN
140.2500 mg/m2 | Freq: Once | INTRAVENOUS | Status: AC
  Administered 2019-12-12: 260 mg via INTRAVENOUS
  Filled 2019-12-12: qty 8

## 2019-12-12 MED ORDER — ATROPINE SULFATE 1 MG/ML IJ SOLN
INTRAMUSCULAR | Status: AC
  Filled 2019-12-12: qty 1

## 2019-12-12 MED ORDER — PROCHLORPERAZINE MALEATE 10 MG PO TABS: 10.0000 mg | ORAL_TABLET | Freq: Four times a day (QID) | ORAL | 1 refills | Status: DC | PRN

## 2019-12-12 MED ORDER — ACETAMINOPHEN 325 MG PO TABS
ORAL_TABLET | ORAL | Status: AC
  Filled 2019-12-12: qty 2

## 2019-12-12 MED ORDER — LOPERAMIDE HCL 2 MG PO TABS: ORAL_TABLET | ORAL | 1 refills | Status: DC

## 2019-12-12 MED ORDER — ONDANSETRON HCL 8 MG PO TABS: 8.0000 mg | ORAL_TABLET | Freq: Two times a day (BID) | ORAL | 1 refills | Status: DC | PRN

## 2019-12-12 NOTE — Telephone Encounter (Signed)
Appointments have already been scheduled per 5/12 los

## 2019-12-12 NOTE — Progress Notes (Signed)
Patient arrived to the office this morning believing her appointments were today. Dr Marin Olp will add her on to today's schedule. She will receive chemo education from the infusion RN. We will revisit dietary consult at another time.   Initial RN Navigator Patient Visit  Name: NAELANI LAFRANCE Date of Referral : hospital referral  Diagnosis: Metastatic adenocarcinoma of the cecum-liver metastasis  Met with patient prior to their visit with MD. Hanley Seamen patient "Your Patient Navigator" handout which explains my role, areas in which I am able to help, and all the contact information for myself and the office. Also gave patient MD and Navigator business card. Reviewed with patient the general overview of expected course after initial diagnosis and time frame for all steps to be completed.  New patient packet given to patient which includes: orientation to office and staff; campus directory; education on My Chart and Advance Directives; and patient centered education on colorectal cancer.   Patient is here for first chemo treatment. All work up complete

## 2019-12-12 NOTE — Progress Notes (Signed)
Hematology and Oncology Follow Up Visit  Anita Hoffman [id number] 01/19/1961 58 y.o. 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 daily  IV iron-dose of Feraheme given on 12/12/2019     Interim History:  Anita Hoffman is back for her her first office visit.  I had been seeing her in the hospital when she was admitted to [facility].  She basic presented with bilateral pulmonary emboli and a thrombus in the left leg.  She was found to have a cecal mass.  This was biopsied and came out to be an adenocarcinoma.  She had extensive liver metastasis.  Her last CEA level was about 17,000.  She has HIV.  She has been pretty asymptomatic with this.  She did have a CD4 count over 500 when she was in the hospital.  She had did have a Port-A-Cath placed while in the hospital.  She now is into the office for her first cycle of chemotherapy.  I have to believe that she will respond very nicely.  Again, we are awaiting the molecular markers.  She comes in with her mother.  She looks quite good.  She feels better.  She has had no chest wall pain.  She has had no shortness of breath.  She has had no nausea or vomiting.  There is been no problems with bowels or bladder.  She has had no headache.  She has had no rashes.  There is been no leg swelling.  Currently, I would say her performance status is ECOG 1.  Medications:  Current Outpatient Medications:  .  bictegravir-emtricitabine-tenofovir AF (BIKTARVY) 50-200-25 MG TABS tablet, Take 1 tablet by mouth daily., Disp: 30 tablet, Rfl: 0 .  dexamethasone (DECADRON) 4 MG tablet, Take 2 tablets (8 mg total) by mouth daily. Start the day after chemotherapy for 3 days. Take  with food., Disp: 8 tablet, Rfl: 5 .  enoxaparin (LOVENOX) 80 MG/0.8ML injection, Inject 0.8 mLs (80 mg total) into the skin 2 (two) times daily., Disp: 48 mL, Rfl: 0 .  lidocaine-prilocaine (EMLA) cream, Apply to affected area once, Disp: 30 g, Rfl: 3 .  loperamide (IMODIUM A-D) 2 MG tablet, Take 2 at onset of diarrhea, then 1 every 2hrs until 12hr without a BM. May take 2 tab every 4hrs at bedtime. If diarrhea recurs repeat., Disp: 100 tablet, Rfl: 1 .  LORazepam (ATIVAN) 0.5 MG tablet, Take 1 tablet (0.5 mg total) by mouth every 6 (six) hours as needed for anxiety. (Patient not taking: Reported on 12/12/2019), Disp: 30 tablet, Rfl: 0 .  ondansetron (ZOFRAN) 8 MG tablet, Take 1 tablet (8 mg total) by mouth 2 (two) times daily as needed. Start on day 3 after chemotherapy. (Patient not taking: Reported on 12/12/2019), Disp: 30 tablet, Rfl: 1 .  prochlorperazine (COMPAZINE) 10 MG tablet, Take 1 tablet (10 mg total) by mouth every 6 (six) hours as needed (Nausea or vomiting). (Patient not taking: Reported on 12/12/2019), Disp: 30 tablet, Rfl: 1  Allergies: No Known Allergies  Past Medical History, Surgical history, Social history, and Family History were reviewed and updated.  Review of Systems: Review of Systems  Constitutional: Negative.   HENT:  Negative.   Eyes: Negative.   Respiratory: Negative.     Cardiovascular: Negative.   Gastrointestinal: Negative.   Endocrine: Negative.   Genitourinary: Negative.    Musculoskeletal: Negative.   Skin: Negative.   Neurological: Negative.   Hematological: Negative.     Physical Exam:  height is 5' 2" (1.575 m) and weight is 166 lb 8 oz (75.5 kg). Her temporal temperature is 97.3 F (36.3 C) (abnormal). Her blood pressure is 107/68 and her pulse is 97. Her respiration is 16 and oxygen saturation is 100%.   Wt Readings from Last 3 Encounters:  12/12/19 166 lb 8 oz (75.5 kg)  12/06/19 170 lb 1.6 oz (77.2 kg)    Physical Exam Vitals reviewed.    HENT:     Head: Normocephalic and atraumatic.  Eyes:     Pupils: Pupils are equal, round, and reactive to light.  Cardiovascular:     Rate and Rhythm: Normal rate and regular rhythm.     Heart sounds: Normal heart sounds.  Pulmonary:     Effort: Pulmonary effort is normal.     Breath sounds: Normal breath sounds.  Abdominal:     General: Bowel sounds are normal.     Palpations: Abdomen is soft.  Musculoskeletal:        General: No tenderness or deformity. Normal range of motion.     Cervical back: Normal range of motion.  Lymphadenopathy:     Cervical: No cervical adenopathy.  Skin:    General: Skin is warm and dry.     Findings: No erythema or rash.  Neurological:     Mental Status: She is alert and oriented to person, place, and time.  Psychiatric:        Behavior: Behavior normal.        Thought Content: Thought content normal.        Judgment: Judgment normal.      Lab Results  Component Value Date   WBC 10.9 (H) 12/12/2019   HGB 9.2 (L) 12/12/2019   HCT 29.5 (L) 12/12/2019   MCV 81.0 12/12/2019   PLT 491 (H) 12/12/2019     Chemistry      Component Value Date/Time   NA 134 (L) 12/12/2019 0900   K 3.4 (L) 12/12/2019 0900   CL 97 (L) 12/12/2019 0900   CO2 26 12/12/2019 0900   BUN 7 12/12/2019 0900   CREATININE 0.54 12/12/2019 0900      Component Value Date/Time   CALCIUM 8.4 (L) 12/12/2019 0900   ALKPHOS 254 (H) 12/12/2019 0900   AST 63 (H) 12/12/2019 0900   ALT 23 12/12/2019 0900   BILITOT 0.7 12/12/2019 0900      Impression and Plan: Anita Hoffman is a very nice 58-year-old African-American female with metastatic colon cancer.  The primary is in the cecum.  She has extensive liver metastasis.  The CEA clearly tells us the problem with extensive disease.  I am sure that the thromboembolic disease is from her underlying malignancy.  We will go ahead and get her started on chemotherapy with FOLFOXIRI.  I think this would be a good option for her.  I  do not think we need to add a vascular inhibitor or EGFR inhibitor.  We will have to wait the K-ras analysis.  I really feel confident that she will respond and that her CEA will come down.  She is on Lovenox.  I would like to keep her on Lovenox for least 2 or 3 months.  I want to make sure that we thinned out these thrombi before   we switch her over to an oral anticoagulant.  I reviewed chemotherapy with her.  I went over the side effects of chemotherapy with her.  I do not think we need a white cell booster.  I forgot to mention that she is iron deficient.  We will go ahead and give her dose of IV iron.  I probably would plan for 4 cycles of chemotherapy and then rescan her.  I do not think we need a PET scan.  We clearly will know how things are going with respect to the CEA and also with her CT scans.  We will plan to get her back in 2 weeks for her second cycle of treatment.   Volanda Napoleon, MD 5/12/20219:37 AM

## 2019-12-12 NOTE — Progress Notes (Signed)
As oxaliplatin and leucovorin infusion completed; pt started to c/o lower abdominal cramping. Pt denies nausea or any other complaints. Sarah NP at chair side to evaluate pt. Atropine given per orders; refer to [facility]. Pt educated on atropine. Pt verbalized understanding and had no further questions. Pt verbalized immediate relief with intervention.

## 2019-12-12 NOTE — Progress Notes (Signed)
MD does want third dose of Feraheme today.  Pt self- pay. Will sign Feraheme paperwork today.  Hardie Pulley, PharmD, BCPS, BCOP

## 2019-12-12 NOTE — Patient Instructions (Signed)
[facility] Discharge Instructions for Patients Receiving Chemotherapy  Today you received the following chemotherapy agents Irinotecan, Oxaliplatin, Leucovorin and 5FU  To help prevent nausea and vomiting after your treatment, we encourage you to take your nausea medication as prescribed by MD.   If you develop nausea and vomiting that is not controlled by your nausea medication, call the clinic.   BELOW ARE SYMPTOMS THAT SHOULD BE REPORTED IMMEDIATELY:  *FEVER GREATER THAN 100.5 F  *CHILLS WITH OR WITHOUT FEVER  NAUSEA AND VOMITING THAT IS NOT CONTROLLED WITH YOUR NAUSEA MEDICATION  *UNUSUAL SHORTNESS OF BREATH  *UNUSUAL BRUISING OR BLEEDING  TENDERNESS IN MOUTH AND THROAT WITH OR WITHOUT PRESENCE OF ULCERS  *URINARY PROBLEMS  *BOWEL PROBLEMS  UNUSUAL RASH Items with * indicate a potential emergency and should be followed up as soon as possible.  Feel free to call the clinic should you have any questions or concerns. The clinic phone number is [phone].  Please show the [place] at check-in to the Emergency Department and triage nurse.

## 2019-12-13 ENCOUNTER — Telehealth: Payer: Self-pay | Admitting: *Deleted

## 2019-12-13 ENCOUNTER — Other Ambulatory Visit: Payer: Self-pay

## 2019-12-13 ENCOUNTER — Inpatient Hospital Stay (HOSPITAL_COMMUNITY)
Admission: EM | Admit: 2019-12-13 | Discharge: 2019-12-17 | DRG: 374 | Disposition: A | Discharge status: Home / Self Care | Payer: Self-pay | Attending: Family Medicine | Admitting: Family Medicine

## 2019-12-13 ENCOUNTER — Encounter (HOSPITAL_COMMUNITY): Payer: Self-pay | Admitting: Emergency Medicine

## 2019-12-13 DIAGNOSIS — Z86711 Personal history of pulmonary embolism: Secondary | ICD-10-CM

## 2019-12-13 DIAGNOSIS — C787 Secondary malignant neoplasm of liver and intrahepatic bile duct: Secondary | ICD-10-CM | POA: Diagnosis present

## 2019-12-13 DIAGNOSIS — Z79899 Other long term (current) drug therapy: Secondary | ICD-10-CM

## 2019-12-13 DIAGNOSIS — C180 Malignant neoplasm of cecum: Principal | ICD-10-CM | POA: Diagnosis present

## 2019-12-13 DIAGNOSIS — D509 Iron deficiency anemia, unspecified: Secondary | ICD-10-CM | POA: Diagnosis present

## 2019-12-13 DIAGNOSIS — C799 Secondary malignant neoplasm of unspecified site: Secondary | ICD-10-CM | POA: Diagnosis present

## 2019-12-13 DIAGNOSIS — Z801 Family history of malignant neoplasm of trachea, bronchus and lung: Secondary | ICD-10-CM

## 2019-12-13 DIAGNOSIS — Z86718 Personal history of other venous thrombosis and embolism: Secondary | ICD-10-CM

## 2019-12-13 DIAGNOSIS — R7989 Other specified abnormal findings of blood chemistry: Secondary | ICD-10-CM | POA: Diagnosis present

## 2019-12-13 DIAGNOSIS — Z66 Do not resuscitate: Secondary | ICD-10-CM | POA: Diagnosis present

## 2019-12-13 DIAGNOSIS — Z87891 Personal history of nicotine dependence: Secondary | ICD-10-CM

## 2019-12-13 DIAGNOSIS — D696 Thrombocytopenia, unspecified: Secondary | ICD-10-CM | POA: Diagnosis present

## 2019-12-13 DIAGNOSIS — K219 Gastro-esophageal reflux disease without esophagitis: Secondary | ICD-10-CM | POA: Diagnosis present

## 2019-12-13 DIAGNOSIS — D62 Acute posthemorrhagic anemia: Secondary | ICD-10-CM

## 2019-12-13 DIAGNOSIS — K625 Hemorrhage of anus and rectum: Secondary | ICD-10-CM | POA: Diagnosis present

## 2019-12-13 DIAGNOSIS — Z7901 Long term (current) use of anticoagulants: Secondary | ICD-10-CM

## 2019-12-13 DIAGNOSIS — Z21 Asymptomatic human immunodeficiency virus [HIV] infection status: Secondary | ICD-10-CM | POA: Diagnosis present

## 2019-12-13 DIAGNOSIS — Z85038 Personal history of other malignant neoplasm of large intestine: Secondary | ICD-10-CM

## 2019-12-13 DIAGNOSIS — E876 Hypokalemia: Secondary | ICD-10-CM | POA: Diagnosis present

## 2019-12-13 DIAGNOSIS — B20 Human immunodeficiency virus [HIV] disease: Secondary | ICD-10-CM | POA: Diagnosis present

## 2019-12-13 DIAGNOSIS — R7401 Elevation of levels of liver transaminase levels: Secondary | ICD-10-CM | POA: Diagnosis present

## 2019-12-13 DIAGNOSIS — Z20822 Contact with and (suspected) exposure to covid-19: Secondary | ICD-10-CM | POA: Diagnosis present

## 2019-12-13 DIAGNOSIS — D500 Iron deficiency anemia secondary to blood loss (chronic): Secondary | ICD-10-CM | POA: Diagnosis present

## 2019-12-13 DIAGNOSIS — I2699 Other pulmonary embolism without acute cor pulmonale: Secondary | ICD-10-CM | POA: Diagnosis present

## 2019-12-13 DIAGNOSIS — C189 Malignant neoplasm of colon, unspecified: Secondary | ICD-10-CM

## 2019-12-13 DIAGNOSIS — Z8041 Family history of malignant neoplasm of ovary: Secondary | ICD-10-CM

## 2019-12-13 LAB — SARS CORONAVIRUS 2 BY RT PCR (HOSPITAL ORDER, PERFORMED IN ~~LOC~~ HOSPITAL LAB): SARS Coronavirus 2: NEGATIVE

## 2019-12-13 LAB — CBC WITH DIFFERENTIAL/PLATELET
Abs Immature Granulocytes: 0.1 10*3/uL — ABNORMAL HIGH (ref 0.00–0.07)
Basophils Absolute: 0 10*3/uL (ref 0.0–0.1)
Basophils Relative: 0 %
Eosinophils Absolute: 0 10*3/uL (ref 0.0–0.5)
Eosinophils Relative: 0 %
HCT: 32.7 % — ABNORMAL LOW (ref 36.0–46.0)
Hemoglobin: 10 g/dL — ABNORMAL LOW (ref 12.0–15.0)
Immature Granulocytes: 1 %
Lymphocytes Relative: 6 %
Lymphs Abs: 0.8 10*3/uL (ref 0.7–4.0)
MCH: 25.5 pg — ABNORMAL LOW (ref 26.0–34.0)
MCHC: 30.6 g/dL (ref 30.0–36.0)
MCV: 83.4 fL (ref 80.0–100.0)
Monocytes Absolute: 0.7 10*3/uL (ref 0.1–1.0)
Monocytes Relative: 5 %
Neutro Abs: 13.5 10*3/uL — ABNORMAL HIGH (ref 1.7–7.7)
Neutrophils Relative %: 88 %
Platelets: 616 10*3/uL — ABNORMAL HIGH (ref 150–400)
RBC: 3.92 MIL/uL (ref 3.87–5.11)
RDW: 22.1 % — ABNORMAL HIGH (ref 11.5–15.5)
WBC: 15.2 10*3/uL — ABNORMAL HIGH (ref 4.0–10.5)
nRBC: 0 % (ref 0.0–0.2)

## 2019-12-13 LAB — COMPREHENSIVE METABOLIC PANEL
ALT: 28 U/L (ref 0–44)
AST: 59 U/L — ABNORMAL HIGH (ref 15–41)
Albumin: 2.6 g/dL — ABNORMAL LOW (ref 3.5–5.0)
Alkaline Phosphatase: 265 U/L — ABNORMAL HIGH (ref 38–126)
Anion gap: 13 (ref 5–15)
BUN: 10 mg/dL (ref 6–20)
CO2: 22 mmol/L (ref 22–32)
Calcium: 8.1 mg/dL — ABNORMAL LOW (ref 8.9–10.3)
Chloride: 98 mmol/L (ref 98–111)
Creatinine, Ser: 0.68 mg/dL (ref 0.44–1.00)
GFR calc Af Amer: >60 mL/min (ref >60)
GFR calc non Af Amer: >60 mL/min (ref >60)
Glucose, Bld: 148 mg/dL — ABNORMAL HIGH (ref 70–99)
Potassium: 3.5 mmol/L (ref 3.5–5.1)
Sodium: 133 mmol/L — ABNORMAL LOW (ref 135–145)
Total Bilirubin: 0.9 mg/dL (ref 0.3–1.2)
Total Protein: 7.4 g/dL (ref 6.5–8.1)

## 2019-12-13 LAB — APTT: aPTT: 36 seconds (ref 24–36)

## 2019-12-13 LAB — HEPARIN LEVEL (UNFRACTIONATED)
Heparin Unfractionated: 0.49 IU/mL (ref 0.30–0.70)
Heparin Unfractionated: 0.55 IU/mL (ref 0.30–0.70)

## 2019-12-13 LAB — HEMOGLOBIN AND HEMATOCRIT, BLOOD
HCT: 30.8 % — ABNORMAL LOW (ref 36.0–46.0)
HCT: 30.6 % — ABNORMAL LOW (ref 36.0–46.0)
Hemoglobin: 9.5 g/dL — ABNORMAL LOW (ref 12.0–15.0)
Hemoglobin: 9.5 g/dL — ABNORMAL LOW (ref 12.0–15.0)

## 2019-12-13 LAB — PROTIME-INR
INR: 1.1 (ref 0.8–1.2)
Prothrombin Time: 14.2 seconds (ref 11.4–15.2)

## 2019-12-13 LAB — ABO/RH: ABO/RH(D): O POS

## 2019-12-13 LAB — POC OCCULT BLOOD, ED: Fecal Occult Bld: POSITIVE — AB

## 2019-12-13 MED ORDER — ONDANSETRON HCL 4 MG PO TABS: 4.0000 mg | ORAL_TABLET | Freq: Four times a day (QID) | ORAL | Status: DC | PRN

## 2019-12-13 MED ORDER — HYDROCODONE-ACETAMINOPHEN 5-325 MG PO TABS
1.0000 | ORAL_TABLET | ORAL | Status: DC | PRN
  Administered 2019-12-15: 2 via ORAL
  Administered 2019-12-15 – 2019-12-17 (×3): 1 via ORAL
  Filled 2019-12-13: qty 2
  Filled 2019-12-13: qty 1
  Filled 2019-12-13: qty 2
  Filled 2019-12-13: qty 1

## 2019-12-13 MED ORDER — ONDANSETRON HCL 4 MG/2ML IJ SOLN: 4.0000 mg | Freq: Four times a day (QID) | INTRAMUSCULAR | Status: DC | PRN

## 2019-12-13 MED ORDER — BICTEGRAVIR-EMTRICITAB-TENOFOV 50-200-25 MG PO TABS
1.0000 | ORAL_TABLET | Freq: Every day | ORAL | Status: DC
  Administered 2019-12-13 – 2019-12-17 (×5): 1 via ORAL
  Filled 2019-12-13 (×5): qty 1

## 2019-12-13 MED ORDER — HEPARIN (PORCINE) 25000 UT/250ML-% IV SOLN
1000.0000 [IU]/h | INTRAVENOUS | Status: DC
  Administered 2019-12-13: 1100 [IU]/h via INTRAVENOUS
  Filled 2019-12-13: qty 250

## 2019-12-13 MED ORDER — LOPERAMIDE HCL 2 MG PO CAPS: 2.0000 mg | ORAL_CAPSULE | Freq: Four times a day (QID) | ORAL | Status: DC | PRN

## 2019-12-13 MED ORDER — ACETAMINOPHEN 325 MG PO TABS
650.0000 mg | ORAL_TABLET | Freq: Four times a day (QID) | ORAL | Status: DC | PRN
  Administered 2019-12-14: 650 mg via ORAL
  Filled 2019-12-13: qty 2

## 2019-12-13 MED ORDER — ACETAMINOPHEN 650 MG RE SUPP: 650.0000 mg | Freq: Four times a day (QID) | RECTAL | Status: DC | PRN

## 2019-12-13 NOTE — Progress Notes (Signed)
ANTICOAGULATION CONSULT NOTE  Pharmacy Consult for Heparin Indication: pulmonary embolus  No Known Allergies  Patient Measurements: Height: 5\' 2"  (157.5 cm) Weight: 75.5 kg (166 lb 7.2 oz) IBW/kg (Calculated) : 50.1 Heparin Dosing Weight: 66.5 kg  Vital Signs: Temp: 97.6 F (36.4 C) (05/13 1847) Temp Source: Oral (05/13 1847) BP: 111/80 (05/13 1847) Pulse Rate: 77 (05/13 1847)  Labs: Recent Labs    12/12/19 0900 12/13/19 1043 12/13/19 1401 12/13/19 1938  HGB 9.2* 10.0* 9.5*  --   HCT 29.5* 32.7* 30.8*  --   PLT 491* 616*  --   --   APTT  --  36  --   --   LABPROT  --  14.2  --   --   INR  --  1.1  --   --   HEPARINUNFRC  --   --  0.49 0.55  CREATININE 0.54 0.68  --   --     Estimated Creatinine Clearance: 73.9 mL/min (by C-G formula based on SCr of 0.68 mg/dL).   Medications:  Lovenox 80 mg q 12 h PTA, last dose 5/12 @ 5 pm  Assessment: 59 y/o F with recent diagnosis of DVT/PE with right heart strain on Lovenox 1 mg/kg q 12 h prior to admission admitted for lower GIB. 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 Per GI, patient has a large obstructing/ulcerated cecal mass. Anemia stable after transfusion  Today, 12/13/2019:  Hgb remains stable despite reported rectal bleeding; no reported bleeding since admitted; Plt now elevated  Most recent heparin level slightly above goal on 1100 units/hr  No infusion issues per RN  Goal of Therapy:  Heparin level 0.3-0.5 units/ml Monitor platelets by anticoagulation protocol: Yes   Plan:   Target lower end of goal range with bleeding (but would still prefer to stay in the 0.4-0.5 units/ml range given PE w/ RH strain)  Reduce heparin infusion to 1000 units/hr  Check anti-Xa level in 6 hours and daily while on heparin  Daily CBC  Monitor for signs of worsening bleeding or thrombosis  Reuel Boom, PharmD,  BCPS [phone] 12/13/2019, 8:41 PM

## 2019-12-13 NOTE — ED Provider Notes (Signed)
[facility] DEPT Provider Note   CSN: [id number] Arrival date & time: 12/13/19  [id number]     History Chief Complaint  Patient presents with  . Rectal Bleeding    Anita Hoffman is a 59 y.o. female.  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  Patient states that this morning she awoke with some mild abdominal discomfort.  She then had the urge to have a bowel movement.  When she went to the restroom she passed bright red blood.  She had one episode.  She called her oncologist and was referred immediately to the emergency department.  Patient continues to have some mild right upper quadrant discomfort.  She denies any chest pain or worsening shortness of breath.  She has not had any lightheadedness or syncope.  She has never needed a blood transfusion.  She denies any abdominal surgeries.        Past Medical History:  Diagnosis Date  . Former smoker   . Goals of care, counseling/discussion 12/06/2019  . HIV infection ([place])   . Iron deficiency anemia due to chronic blood loss 12/12/2019  . Medical history non-contributory     Patient Active Problem List   Diagnosis Date Noted  . Iron deficiency anemia due to chronic blood loss 12/12/2019  . Goals of care, counseling/discussion 12/06/2019  . HIV disease ([place])   . Adenomatous polyp of ascending colon   . Adenomatous polyp of sigmoid colon   . Metastatic malignant neoplasm ([place])   . Bilateral pulmonary embolism ([place]) 11/29/2019  . 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 Liver metastases ([place]) 11/29/2019  . Elevated troponin 11/29/2019  . ANXIETY, SITUATIONAL 10/13/2009  . PALPITATIONS, RECURRENT 10/03/2009  . TOBACCO USE, QUIT 09/29/2006    Past Surgical History:  Procedure Laterality Date  . BIOPSY  12/01/2019     Procedure: BIOPSY;  Surgeon: Lavena Bullion, DO;  Location: [facility];  Service: Gastroenterology;;  . COLONOSCOPY WITH PROPOFOL N/A 12/01/2019   Procedure: COLONOSCOPY WITH PROPOFOL;  Surgeon: Lavena Bullion, DO;  Location: [facility];  Service: Gastroenterology;  Laterality: N/A;  . HEMOSTASIS CLIP PLACEMENT  12/01/2019   Procedure: HEMOSTASIS CLIP PLACEMENT;  Surgeon: Lavena Bullion, DO;  Location: [facility];  Service: Gastroenterology;;  . IR IMAGING GUIDED PORT INSERTION  12/06/2019  . POLYPECTOMY  12/01/2019   Procedure: POLYPECTOMY;  Surgeon: Lavena Bullion, DO;  Location: [facility];  Service: Gastroenterology;;  . SUBMUCOSAL TATTOO INJECTION  12/01/2019   Procedure: SUBMUCOSAL TATTOO INJECTION;  Surgeon: Lavena Bullion, DO;  Location: [facility];  Service: Gastroenterology;;  . TUBAL LIGATION       OB History   No obstetric history on file.     Family History  Problem Relation Age of Onset  . Ovarian cancer Maternal Grandmother   . Lung cancer Father     Social History   Tobacco Use  . Smoking status: Former Smoker    Packs/day: 0.50    Types: Cigarettes    Quit date: 11/29/2003    Years since quitting: 16.0  . Smokeless tobacco: Never Used  Substance Use Topics  . Alcohol use: Not Currently    Comment: 1990  . Drug use: Yes    Types: Marijuana    Comment: 1990    Home Medications Prior to Admission medications  Medication Sig Start Date End Date Taking? Authorizing Provider  bictegravir-emtricitabine-tenofovir AF (BIKTARVY) 50-200-25 MG TABS tablet Take 1 tablet by mouth daily. 12/03/19   Amin, Jeanella Flattery, MD  dexamethasone (DECADRON) 4 MG tablet Take 2 tablets (8 mg total) by mouth daily. Start the day after chemotherapy for 3 days. Take with food. 12/12/19   Volanda Napoleon, MD  enoxaparin (LOVENOX) 80 MG/0.8ML injection Inject 0.8 mLs (80 mg total) into the skin 2 (two) times daily. 12/05/19 01/04/20  Little Ishikawa, MD   lidocaine-prilocaine (EMLA) cream Apply to affected area once 12/12/19   Volanda Napoleon, MD  loperamide (IMODIUM A-D) 2 MG tablet Take 2 at onset of diarrhea, then 1 every 2hrs until 12hr without a BM. May take 2 tab every 4hrs at bedtime. If diarrhea recurs repeat. 12/12/19   Volanda Napoleon, MD  LORazepam (ATIVAN) 0.5 MG tablet Take 1 tablet (0.5 mg total) by mouth every 6 (six) hours as needed for anxiety. 12/12/19   Volanda Napoleon, MD  ondansetron (ZOFRAN) 8 MG tablet Take 1 tablet (8 mg total) by mouth 2 (two) times daily as needed. Start on day 3 after chemotherapy. 12/12/19   Volanda Napoleon, MD  prochlorperazine (COMPAZINE) 10 MG tablet Take 1 tablet (10 mg total) by mouth every 6 (six) hours as needed (Nausea or vomiting). 12/12/19   Volanda Napoleon, MD    Allergies    Patient has no known allergies.  Review of Systems   Review of Systems  Constitutional: Negative for fever.  HENT: Negative for rhinorrhea and sore throat.   Eyes: Negative for redness.  Respiratory: Negative for cough.   Cardiovascular: Negative for chest pain.  Gastrointestinal: Positive for abdominal pain and blood in stool. Negative for diarrhea, nausea and vomiting.  Genitourinary: Negative for dysuria.  Musculoskeletal: Negative for myalgias.  Skin: Negative for rash.  Neurological: Negative for headaches.    Physical Exam Updated Vital Signs BP 124/90 (BP Location: Right Arm)   Pulse (!) 113   Temp (!) 97.5 F (36.4 C) (Oral)   Resp 18   SpO2 100%   Physical Exam Vitals and nursing note reviewed. Exam conducted with a chaperone present.  Constitutional:      Appearance: She is well-developed.  HENT:     Head: Normocephalic and atraumatic.  Eyes:     General:        Right eye: No discharge.        Left eye: No discharge.     Conjunctiva/sclera: Conjunctivae normal.  Cardiovascular:     Rate and Rhythm: Normal rate and regular rhythm.     Heart sounds: Normal heart sounds.  Pulmonary:      Effort: Pulmonary effort is normal.     Breath sounds: Normal breath sounds.  Abdominal:     Palpations: Abdomen is soft.     Tenderness: There is abdominal tenderness (RUQ, mild).  Genitourinary:    Exam position: Knee-chest position.     Rectum: Guaiac result positive. External hemorrhoid (Nonbleeding, nonthrombosed) present. No mass or tenderness.  Musculoskeletal:     Cervical back: Normal range of motion and neck supple.  Skin:    General: Skin is warm and dry.  Neurological:     Mental Status: She is alert.     ED Results / Procedures / Treatments   Labs (all labs ordered are listed, but only abnormal results are displayed) Labs Reviewed  COMPREHENSIVE METABOLIC PANEL - Abnormal; Notable for the following components:  Result Value   Sodium 133 (*)    Glucose, Bld 148 (*)    Calcium 8.1 (*)    Albumin 2.6 (*)    AST 59 (*)    Alkaline Phosphatase 265 (*)    All other components within normal limits  CBC WITH DIFFERENTIAL/PLATELET - Abnormal; Notable for the following components:   WBC 15.2 (*)    Hemoglobin 10.0 (*)    HCT 32.7 (*)    MCH 25.5 (*)    RDW 22.1 (*)    Platelets 616 (*)    Neutro Abs 13.5 (*)    Abs Immature Granulocytes 0.10 (*)    All other components within normal limits  POC OCCULT BLOOD, ED - Abnormal; Notable for the following components:   Fecal Occult Bld POSITIVE (*)    All other components within normal limits  SARS CORONAVIRUS 2 BY RT PCR (HOSPITAL ORDER, [facility])  PROTIME-INR  APTT  HEMOGLOBIN AND HEMATOCRIT, BLOOD  HEMOGLOBIN AND HEMATOCRIT, BLOOD  TYPE AND SCREEN    EKG None  Radiology No results found.  Procedures Procedures (including critical care time)  Medications Ordered in ED Medications - No data to display  ED Course  I have reviewed the triage vital signs and the nursing notes.  Pertinent labs & imaging results that were available during my care of the patient were reviewed  by me and considered in my medical decision making (see chart for details).  Patient seen and examined. Work-up initiated. Rectal exam performed with RN chaperone.   Vital signs reviewed and are as follows: BP 124/90 (BP Location: Right Arm)   Pulse (!) 113   Temp (!) 97.5 F (36.4 C) (Oral)   Resp 18   SpO2 100%   12:31 PM Patient discussed with and seen by Dr. Regenia Skeeter.   Spoke with Amy Stover PA-C with [facility].  Plan is admission for observation in case further bleeding occurs.  They will consult on patient.  Patient updated and agrees with the plan.  Spoke with Dr. Lupita Leash who will see for admission. Pt stable at current time.    MDM Rules/Calculators/A&P                      Admit.    Final Clinical Impression(s) / ED Diagnoses Final diagnoses:  BRBPR (bright red blood per rectum)  Current use of long term anticoagulation    Rx / DC Orders ED Discharge Orders    None       Carlisle Cater, PA-C 12/13/19 [place], MD 12/13/19 1515

## 2019-12-13 NOTE — ED Triage Notes (Signed)
Pt reports that this morning she had to have BM, thought was diarrhea but was bright red blood. Pt takes Lovenox.

## 2019-12-13 NOTE — Progress Notes (Addendum)
ANTICOAGULATION CONSULT NOTE - Initial Consult  Pharmacy Consult for Heparin Indication: pulmonary embolus  No Known Allergies  Patient Measurements:   Heparin Dosing Weight: 66.5 kg  Vital Signs: Temp: 97.5 F (36.4 C) (05/13 0918) Temp Source: Oral (05/13 0918) BP: 107/79 (05/13 1330) Pulse Rate: 82 (05/13 1330)  Labs: Recent Labs    12/12/19 0900 12/13/19 1043  HGB 9.2* 10.0*  HCT 29.5* 32.7*  PLT 491* 616*  APTT  --  36  LABPROT  --  14.2  INR  --  1.1  CREATININE 0.54 0.68    Estimated Creatinine Clearance: 73.9 mL/min (by C-G formula based on SCr of 0.68 mg/dL).   Medical History: Past Medical History:  Diagnosis Date  . Former smoker   . Goals of care, counseling/discussion 12/06/2019  . HIV infection ([place])   . Iron deficiency anemia due to chronic blood loss 12/12/2019  . Medical history non-contributory     Medications:  Lovenox 80 mg q 12 h PTA, last dose 5/12 @ 5 pm  Assessment: 59 y/o F with recent diagnosis of DVT/PE with right heart strain on Lovenox 1 mg/kg q 12 h prior to admission admitted for lower GIB. 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 Per GI, patient has a large obstructing/ulcerated cecal mass. Anemia stable after transfusion  Goal of Therapy:  Heparin level 0.3-0.5 units/ml Monitor platelets by anticoagulation protocol: Yes   Plan:  Target lower end of goal range with bleeding Start heparin infusion at 1100 units/hr Check anti-Xa level in 6 hours and daily while on heparin Continue to monitor H&H and platelets  Ulice Dash D 12/13/2019,1:54 PM

## 2019-12-13 NOTE — H&P (Signed)
History and Physical    KALIJAH JARECKI [id number] DOB: 04-10-1961 DOA: 12/13/2019  PCP: Patient, No Pcp Per   Patient coming from: HOME  Chief Complaint: RECTAL BLEEDING  HPI: CHISOM RAZZA is a 59 y.o. 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 It was painless and no clot and described as " just a little blood".She c/o mild pain on epigastrium. Patient otherwise denies any nausea, vomiting, chest pain, shortness of breath, fever, chills, headache, focal weakness, numbness tingling, speech difficulties.  ED Course: Hemodynamically stable blood work shows leukocytosis at 15 K hemoglobin slightly up at 10 g, GI was consulted and advised admission for monitoring of bleeding in the setting of anticoagulation.  Review of Systems: All systems were reviewed and were negative except as mentioned in HPI above. Negative for fever Negative for chest pain Negative for shortness of breath  Past Medical History:  Diagnosis Date  . Former smoker   . Goals of care, counseling/discussion 12/06/2019  . HIV infection ([place])   . Iron deficiency anemia due to chronic blood loss 12/12/2019  . Medical history non-contributory     Past Surgical History:  Procedure Laterality Date  . BIOPSY  12/01/2019   Procedure: BIOPSY;  Surgeon: Lavena Bullion, DO;  Location: [facility];  Service: Gastroenterology;;  . COLONOSCOPY WITH PROPOFOL N/A 12/01/2019   Procedure: COLONOSCOPY WITH PROPOFOL;  Surgeon: Lavena Bullion, DO;  Location: [facility];  Service: Gastroenterology;  Laterality: N/A;  . HEMOSTASIS CLIP PLACEMENT  12/01/2019   Procedure: HEMOSTASIS CLIP PLACEMENT;  Surgeon: Lavena Bullion, DO;  Location: [facility];  Service:  Gastroenterology;;  . IR IMAGING GUIDED PORT INSERTION  12/06/2019  . POLYPECTOMY  12/01/2019   Procedure: POLYPECTOMY;  Surgeon: Lavena Bullion, DO;  Location: [facility];  Service: Gastroenterology;;  . SUBMUCOSAL TATTOO INJECTION  12/01/2019   Procedure: SUBMUCOSAL TATTOO INJECTION;  Surgeon: Lavena Bullion, DO;  Location: [facility];  Service: Gastroenterology;;  . TUBAL LIGATION       reports that she quit smoking about 16 years ago. Her smoking use included cigarettes. She smoked 0.50 packs per day. She has never used smokeless tobacco. She reports previous alcohol use. She reports current drug use. Drug: Marijuana.  No Known Allergies  Family History  Problem Relation Age of Onset  . Ovarian cancer Maternal Grandmother   . Lung cancer Father      Prior to Admission medications   Medication Sig Start Date End Date Taking? Authorizing Provider  bictegravir-emtricitabine-tenofovir AF (BIKTARVY) 50-200-25 MG TABS tablet Take 1 tablet by mouth daily. 12/03/19  Yes Amin, Ankit Chirag, MD  enoxaparin (LOVENOX) 80 MG/0.8ML injection Inject 0.8 mLs (80 mg total) into the skin 2 (two) times daily. 12/05/19 01/04/20 Yes Little Ishikawa, MD  dexamethasone (DECADRON) 4 MG tablet Take 2 tablets (8 mg total) by mouth daily. Start the day after chemotherapy for 3 days. Take with food. 12/12/19   Volanda Napoleon, MD  lidocaine-prilocaine (EMLA) cream Apply to affected area once Patient taking differently: Apply 1 application topically as needed (port access). Apply to affected area once 12/12/19   Volanda Napoleon, MD  loperamide (IMODIUM A-D) 2 MG tablet Take 2 at onset of diarrhea, then 1 every 2hrs until 12hr without a BM. May  take 2 tab every 4hrs at bedtime. If diarrhea recurs repeat. Patient taking differently: Take 2 mg by mouth 4 (four) times daily as needed for diarrhea or loose stools. Take 2 at onset of diarrhea, then 1 every 2hrs until 12hr without a BM. May take 2 tab every 4hrs at  bedtime. If diarrhea recurs repeat. 12/12/19   Volanda Napoleon, MD  LORazepam (ATIVAN) 0.5 MG tablet Take 1 tablet (0.5 mg total) by mouth every 6 (six) hours as needed for anxiety. 12/12/19   Volanda Napoleon, MD  ondansetron (ZOFRAN) 8 MG tablet Take 1 tablet (8 mg total) by mouth 2 (two) times daily as needed. Start on day 3 after chemotherapy. 12/12/19   Volanda Napoleon, MD  prochlorperazine (COMPAZINE) 10 MG tablet Take 1 tablet (10 mg total) by mouth every 6 (six) hours as needed (Nausea or vomiting). 12/12/19   Volanda Napoleon, MD    Physical Exam: Vitals:   12/13/19 1117 12/13/19 1130 12/13/19 1145 12/13/19 1219  BP: 97/75 102/76 108/81 112/79  Pulse: (!) 109 83 84 79  Resp: 15 15 16 15   Temp:      TempSrc:      SpO2: 100% 100% 100% 100%    General exam: AAOx3 , NAD, weak appearing. anxious. HEENT:Oral mucosa moist, Ear/Nose WNL grossly, dentition normal. Respiratory system: bilaterally clear, no wheezing or crackles,no use of accessory muscle Cardiovascular system: S1 & S2 +, No JVD,. Gastrointestinal system: Abdomen soft, NT,ND, BS+ Nervous System:Alert, awake, moving extremities and grossly nonfocal Extremities: No edema, distal peripheral pulses palpable.  Skin: No rashes,no icterus. MSK: Normal muscle bulk,tone, power    Labs on Admission: I have personally reviewed following labs and imaging studies  CBC: Recent Labs  Lab 12/12/19 0900 12/13/19 1043  WBC 10.9* 15.2*  NEUTROABS 8.6* 13.5*  HGB 9.2* 10.0*  HCT 29.5* 32.7*  MCV 81.0 83.4  PLT 491* [id number]*   Basic Metabolic Panel: Recent Labs  Lab 12/12/19 0900 12/13/19 1043  NA 134* 133*  K 3.4* 3.5  CL 97* 98  CO2 26 22  GLUCOSE 105* 148*  BUN 7 10  CREATININE 0.54 0.68  CALCIUM 8.4* 8.1*   GFR: Estimated Creatinine Clearance: 73.9 mL/min (by C-G formula based on SCr of 0.68 mg/dL). Liver Function Tests: Recent Labs  Lab 12/12/19 0900 12/13/19 1043  AST 63* 59*  ALT 23 28  ALKPHOS 254* 265*    BILITOT 0.7 0.9  PROT 6.8 7.4  ALBUMIN 3.1* 2.6*   No results for input(s): LIPASE, AMYLASE in the last 168 hours. No results for input(s): AMMONIA in the last 168 hours. Coagulation Profile: Recent Labs  Lab 12/13/19 1043  INR 1.1   Cardiac Enzymes: No results for input(s): CKTOTAL, CKMB, CKMBINDEX, TROPONINI in the last 168 hours. BNP (last 3 results) No results for input(s): PROBNP in the last 8760 hours. HbA1C: No results for input(s): HGBA1C in the last 72 hours. CBG: No results for input(s): GLUCAP in the last 168 hours. Lipid Profile: No results for input(s): CHOL, HDL, LDLCALC, TRIG, CHOLHDL, LDLDIRECT in the last 72 hours. Thyroid Function Tests: No results for input(s): TSH, T4TOTAL, FREET4, T3FREE, THYROIDAB in the last 72 hours. Anemia Panel: No results for input(s): VITAMINB12, FOLATE, FERRITIN, TIBC, IRON, RETICCTPCT in the last 72 hours. Urine analysis:    Component Value Date/Time   COLORURINE AMBER (A) 11/30/2019 0629   APPEARANCEUR HAZY (A) 11/30/2019 0629   LABSPEC >1.046 (H) 11/30/2019 0629   PHURINE 5.0 11/30/2019  [place] 11/30/2019 0629   HGBUR NEGATIVE 11/30/2019 0629   BILIRUBINUR NEGATIVE 11/30/2019 0629   KETONESUR 20 (A) 11/30/2019 0629   PROTEINUR 30 (A) 11/30/2019 0629   NITRITE NEGATIVE 11/30/2019 0629   LEUKOCYTESUR NEGATIVE 11/30/2019 0629    Radiological Exams on Admission: No results found.  Assessment/Plan  Bright red bleeding per rectum in the setting of cecal adenocarcinoma, episode of bright red bleeding per rectum x1 today.This is in the setting of anticoagulation for her bilateral PE.GI was consulted in the ED advised monitoring for observation.  Check serial H&H, continue clear liquid diet. I called/consulted and discussed with Dr. Marin Olp from oncology he is worried that this will probably go to be ongoing issues if it is in fact a tumor bleeding given that patient needs anticoagulation, awaiting GI eval to  see patient need scope to figure out where the bleeding is from. If this is infact a tumor bleeding patient may need some sort of tumor resection to prevent ongoing bleeding as she will need to be on anticoagulation. IVC unfortunately not helpful as she already has PE.  Recent Bilateral pulmonary embolism/left gastrocnemius DVT: Discharge on 12/06/2019 on Lovenox 80 mg twice daily last dose yesterday. I discussed with Dr Marin Olp how advised to continue on heparin gtt for now.  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 Marin Olp on 5/12 on  FOLFOX IR  cycle 1 on 5/12,  Mild leucocytosis- but no fever, no dysurea or respiratory symptoms.Repeat cbc in am. Thrombocytosis likely in the setting of chemo treatment Mild transaminitis  AST at 59. repeat CMP in a.m.  Hponatremia: monitor  Iron deficiency anemia- Had Feraheme doseD yesterday.  HIV disease: Resume home HIV medication Biktarvy.  There is no height or weight on file to calculate BMI.   Severity of Illness: The appropriate patient status for this patient is OBSERVATION. Observation status is judged to be reasonable and necessary in order to provide the required intensity of service to ensure the patient's safety. The patient's presenting symptoms, physical exam findings, and initial radiographic and laboratory data in the context of their medical condition is felt to place them at decreased risk for further clinical deterioration. Furthermore, it is anticipated that the patient will be medically stable for discharge from the hospital within 2 midnights of admission. The following factors support the patient status of observation.    DVT prophylaxis:  SCD/heparin gtt. Code Status:DNR  Family Communication: Admission, patients condition and plan of care including tests being ordered have been discussed with the patient and indicates understanding and agree with the plan and Code  Status.  Consults called:  GI Oncology-[place]  Antonieta Pert MD [facility]  If 7PM-7AM, please contact night-coverage [email or web address]  12/13/2019, 12:29 PM

## 2019-12-13 NOTE — Telephone Encounter (Signed)
Call received from patient stating that she is having red, bloody, diarrhea stools which started this morning.  Dr. Marin Olp notified and order received for pt to go to the ER now.  Pt notified to go to the [facility] now per order of Dr. Marin Olp.  Pt. States that she will have her mother take her to the ER now.

## 2019-12-13 NOTE — Consult Note (Addendum)
Consultation  Referring Provider:  [facility]/ [facility] Primary Care Physician:  Patient, No Pcp Per Primary Gastroenterologist:  Dr.Cirigliano  Reason for Consultation: Bloody stools, lower GI bleed  HPI: Anita Hoffman is a 59 y.o. female, recently known to Dr. Bryan Lemma from hospitalization 4/28 through 12/06/2019.  She had presented with complaints of shortness of breath, abdominal pain fatigue and 35 pound weight loss. She was found to have a left lower extremity DVT and PE with right heart strain and has been on Lovenox. CT of the abdomen and pelvis on 11/29/2019 showed multiple poorly differentiated masses in the liver consistent with extensive hepatic metastatic disease, there was also a large cecal mass and a mesenteric mass measuring 4.1 x 2.8 cm likely representative of nodal mets. She had colonoscopy on 12/01/2019 which showed a frond-like fungating, ulcerated obstructing large cecal mass extending into the ascending colon.  Biopsies consistent with an invasive adenocarcinoma. She was discharged home on Lovenox on 12/06/2019 and saw Dr. Marin Olp to initiate chemotherapy yesterday.  She was treated with FOLFOX. She awakened this morning with what she thought was diarrhea and then realized she had had agrossly bloody bowel movement.  She has not had any further episodes since arrival to the ER.  She is complaining of right-sided abdominal pain which is the same pain she has been having over the past couple of months, no worse. Denies any nausea or vomiting Hemodynamically stable since arrival to the ER. WBC 15.2, hemoglobin 10/hematocrit 32.7, platelets 616, INR within normal limits. Yesterday prechemo hemoglobin 9.2 hematocrit of 29.8 On 12/03/2019 hemoglobin 7.1 hematocrit of 23.2 and was transfused.     Past Medical History:  Diagnosis Date  . Former smoker   . Goals of care, counseling/discussion 12/06/2019  . HIV infection ([place])   . Iron deficiency anemia due to chronic blood loss  12/12/2019  . Medical history non-contributory     Past Surgical History:  Procedure Laterality Date  . BIOPSY  12/01/2019   Procedure: BIOPSY;  Surgeon: Lavena Bullion, DO;  Location: [facility];  Service: Gastroenterology;;  . COLONOSCOPY WITH PROPOFOL N/A 12/01/2019   Procedure: COLONOSCOPY WITH PROPOFOL;  Surgeon: Lavena Bullion, DO;  Location: [place];  Service: Gastroenterology;  Laterality: N/A;  . HEMOSTASIS CLIP PLACEMENT  12/01/2019   Procedure: HEMOSTASIS CLIP PLACEMENT;  Surgeon: Lavena Bullion, DO;  Location: [place];  Service: Gastroenterology;;  . IR IMAGING GUIDED PORT INSERTION  12/06/2019  . POLYPECTOMY  12/01/2019   Procedure: POLYPECTOMY;  Surgeon: Lavena Bullion, DO;  Location: [facility];  Service: Gastroenterology;;  . SUBMUCOSAL TATTOO INJECTION  12/01/2019   Procedure: SUBMUCOSAL TATTOO INJECTION;  Surgeon: Lavena Bullion, DO;  Location: [place];  Service: Gastroenterology;;  . TUBAL LIGATION      Prior to Admission medications   Medication Sig Start Date End Date Taking? Authorizing Provider  bictegravir-emtricitabine-tenofovir AF (BIKTARVY) 50-200-25 MG TABS tablet Take 1 tablet by mouth daily. 12/03/19  Yes Amin, Ankit Chirag, MD  enoxaparin (LOVENOX) 80 MG/0.8ML injection Inject 0.8 mLs (80 mg total) into the skin 2 (two) times daily. 12/05/19 01/04/20 Yes Little Ishikawa, MD  dexamethasone (DECADRON) 4 MG tablet Take 2 tablets (8 mg total) by mouth daily. Start the day after chemotherapy for 3 days. Take with food. 12/12/19   Volanda Napoleon, MD  lidocaine-prilocaine (EMLA) cream Apply to affected area once Patient taking differently: Apply 1 application topically as needed (port access). Apply to affected area once 12/12/19  Volanda Napoleon, MD  loperamide (IMODIUM A-D) 2 MG tablet Take 2 at onset of diarrhea, then 1 every 2hrs until 12hr without a BM. May take 2 tab every 4hrs at bedtime. If diarrhea recurs repeat. Patient taking  differently: Take 2 mg by mouth 4 (four) times daily as needed for diarrhea or loose stools. Take 2 at onset of diarrhea, then 1 every 2hrs until 12hr without a BM. May take 2 tab every 4hrs at bedtime. If diarrhea recurs repeat. 12/12/19   Volanda Napoleon, MD  LORazepam (ATIVAN) 0.5 MG tablet Take 1 tablet (0.5 mg total) by mouth every 6 (six) hours as needed for anxiety. 12/12/19   Volanda Napoleon, MD  ondansetron (ZOFRAN) 8 MG tablet Take 1 tablet (8 mg total) by mouth 2 (two) times daily as needed. Start on day 3 after chemotherapy. 12/12/19   Volanda Napoleon, MD  prochlorperazine (COMPAZINE) 10 MG tablet Take 1 tablet (10 mg total) by mouth every 6 (six) hours as needed (Nausea or vomiting). 12/12/19   Volanda Napoleon, MD    No current facility-administered medications for this encounter.   Current Outpatient Medications  Medication Sig Dispense Refill  . bictegravir-emtricitabine-tenofovir AF (BIKTARVY) 50-200-25 MG TABS tablet Take 1 tablet by mouth daily. 30 tablet 0  . enoxaparin (LOVENOX) 80 MG/0.8ML injection Inject 0.8 mLs (80 mg total) into the skin 2 (two) times daily. 48 mL 0  . dexamethasone (DECADRON) 4 MG tablet Take 2 tablets (8 mg total) by mouth daily. Start the day after chemotherapy for 3 days. Take with food. 8 tablet 5  . lidocaine-prilocaine (EMLA) cream Apply to affected area once (Patient taking differently: Apply 1 application topically as needed (port access). Apply to affected area once) 30 g 3  . loperamide (IMODIUM A-D) 2 MG tablet Take 2 at onset of diarrhea, then 1 every 2hrs until 12hr without a BM. May take 2 tab every 4hrs at bedtime. If diarrhea recurs repeat. (Patient taking differently: Take 2 mg by mouth 4 (four) times daily as needed for diarrhea or loose stools. Take 2 at onset of diarrhea, then 1 every 2hrs until 12hr without a BM. May take 2 tab every 4hrs at bedtime. If diarrhea recurs repeat.) 100 tablet 1  . LORazepam (ATIVAN) 0.5 MG tablet Take 1  tablet (0.5 mg total) by mouth every 6 (six) hours as needed for anxiety. 30 tablet 0  . ondansetron (ZOFRAN) 8 MG tablet Take 1 tablet (8 mg total) by mouth 2 (two) times daily as needed. Start on day 3 after chemotherapy. 30 tablet 1  . prochlorperazine (COMPAZINE) 10 MG tablet Take 1 tablet (10 mg total) by mouth every 6 (six) hours as needed (Nausea or vomiting). 30 tablet 1    Allergies as of 12/13/2019  . (No Known Allergies)    Family History  Problem Relation Age of Onset  . Ovarian cancer Maternal Grandmother   . Lung cancer Father     Social History   Socioeconomic History  . Marital status: Single    Spouse name: Not on file  . Number of children: 5  . Years of education: Not on file  . Highest education level: Not on file  Occupational History  . Not on file  Tobacco Use  . Smoking status: Former Smoker    Packs/day: 0.50    Types: Cigarettes    Quit date: 11/29/2003    Years since quitting: 16.0  . Smokeless tobacco: Never Used  Substance  and Sexual Activity  . Alcohol use: Not Currently    Comment: 1990  . Drug use: Yes    Types: Marijuana    Comment: 1990  . Sexual activity: Not Currently  Other Topics Concern  . Not on file  Social History Narrative  . Not on file   Social Determinants of Health   Financial Resource Strain:   . Difficulty of Paying Living Expenses:   Food Insecurity:   . Worried About Charity fundraiser in the Last Year:   . Arboriculturist in the Last Year:   Transportation Needs:   . Film/video editor (Medical):   Marland Kitchen Lack of Transportation (Non-Medical):   Physical Activity:   . Days of Exercise per Week:   . Minutes of Exercise per Session:   Stress:   . Feeling of Stress :   Social Connections:   . Frequency of Communication with Friends and Family:   . Frequency of Social Gatherings with Friends and Family:   . Attends Religious Services:   . Active Member of Clubs or Organizations:   . Attends Theatre manager Meetings:   Marland Kitchen Marital Status:   Intimate Partner Violence:   . Fear of Current or Ex-Partner:   . Emotionally Abused:   Marland Kitchen Physically Abused:   . Sexually Abused:     Review of Systems: Pertinent positive and negative review of systems were noted in the above HPI section.  All other review of systems was otherwise negative. Physical Exam: Vital signs in last 24 hours: Temp:  [97.5 F (36.4 C)] 97.5 F (36.4 C) (05/13 0918) Pulse Rate:  [79-113] 79 (05/13 1219) Resp:  [15-18] 15 (05/13 1219) BP: (97-124)/(75-90) 112/79 (05/13 1219) SpO2:  [99 %-100 %] 100 % (05/13 1219)   General:   Alert,  Well-developed, chronically ill-appearing African-American female, pleasant and cooperative in NAD Family member at bedside Head:  Normocephalic and atraumatic. Eyes:  Sclera clear, no icterus.   Conjunctiva pink. Ears:  Normal auditory acuity. Nose:  No deformity, discharge,  or lesions. Mouth:  No deformity or lesions.   Neck:  Supple; no masses or thyromegaly. Lungs:  Clear throughout to auscultation.   No wheezes, crackles, or rhonchi. Heart:  Regular rate and rhythm; no murmurs, clicks, rubs,  or gallops. Abdomen:  Soft, she is tender in the right lower quadrant and in the right upper quadrant over enlarged liver no guarding or rebound, bowel sounds are present Rectal:  Deferred  Msk:  Symmetrical without gross deformities. . Pulses:  Normal pulses noted. Extremities:  Without clubbing or edema. Neurologic:  Alert and  oriented x4;  grossly normal neurologically. Skin:  Intact without significant lesions or rashes.. Psych:  Alert and cooperative. Normal mood and affect.  Intake/Output from previous day: No intake/output data recorded. Intake/Output this shift: No intake/output data recorded.  Lab Results: Recent Labs    12/12/19 0900 12/13/19 1043  WBC 10.9* 15.2*  HGB 9.2* 10.0*  HCT 29.5* 32.7*  PLT 491* 616*   BMET Recent Labs    12/12/19 0900  12/13/19 1043  NA 134* 133*  K 3.4* 3.5  CL 97* 98  CO2 26 22  GLUCOSE 105* 148*  BUN 7 10  CREATININE 0.54 0.68  CALCIUM 8.4* 8.1*   LFT Recent Labs    12/13/19 1043  PROT 7.4  ALBUMIN 2.6*  AST 59*  ALT 28  ALKPHOS 265*  BILITOT 0.9   PT/INR Recent Labs  12/13/19 1043  LABPROT 14.2  INR 1.1   Hepatitis Panel No results for input(s): HEPBSAG, HCVAB, HEPAIGM, HEPBIGM in the last 72 hours.     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  She has a large obstructing/ulcerated cecal mass. She was also diagnosed with a left lower extremity DVT and PE with right heart strain at the time of recent admission and has been on Lovenox at home over the past 2 weeks  Patient presents now with grossly bloody stool x1 this morning, after initiating chemotherapy with FOLFOX yesterday.  Certainly she has bleeding from the known ulcerated/fungating obstructing cecal mass.  #2 anemia chronic secondary to above-hemoglobin actually stable over the past few days  Plan; clear liquid diet Serial hemoglobins and transfuse as indicated There is no endoscopic intervention for tumor bleeding from the colon.  Hold Lovenox Consult Dr. Ennever/oncology regarding management of DVT/PE in the setting.  Expect she will need IVC filter.  I discussed this with the patient at bedside today.     Amy EsterwoodPA-C  12/13/2019, 12:31 PM  I have reviewed the entire case in detail with the above APP and discussed the plan in detail.  Therefore, I agree with the diagnoses recorded above. In addition,  I have personally interviewed and examined the patient and have personally reviewed any abdominal/pelvic CT scan images.  My additional thoughts are as follows:  Case reviewed with PA, records of recent hospitalization reviewed including but not limited to Dr.  Vivia Ewing colonoscopy report and recent CT scan report.  Here with hematochezia, single episode this morning after starting chemotherapy for metastatic right-sided colon cancer with heavy tumor burden in the liver.  Patient recently had extensive PE with right heart strain requiring anticoagulation.  She is now on unfractionated heparin, no recurrence of bleeding since this morning. Patient's hematologist has been consulted regarding need for an type of anticoagulation versus placement of IVC filter.  If patient continues to have lower GI bleeding with worsening anemia, anticoagulation must be stopped and IVC filter placed. If anticoagulation cannot be stopped for some reason, then the only option at that point would be high risk palliative right colon resection.  Unfortunately, there is no endoscopic therapy for this bleeding that would give a durable response.   Nelida Meuse III [phone]

## 2019-12-13 NOTE — ED Notes (Signed)
Nurse unable to take report at this time, will call back when ready.

## 2019-12-14 ENCOUNTER — Encounter: Payer: Self-pay | Admitting: *Deleted

## 2019-12-14 DIAGNOSIS — K625 Hemorrhage of anus and rectum: Secondary | ICD-10-CM

## 2019-12-14 DIAGNOSIS — I2699 Other pulmonary embolism without acute cor pulmonale: Secondary | ICD-10-CM

## 2019-12-14 DIAGNOSIS — C180 Malignant neoplasm of cecum: Secondary | ICD-10-CM | POA: Insufficient documentation

## 2019-12-14 LAB — COMPREHENSIVE METABOLIC PANEL
ALT: 77 U/L — ABNORMAL HIGH (ref 0–44)
AST: 295 U/L — ABNORMAL HIGH (ref 15–41)
Albumin: 2.4 g/dL — ABNORMAL LOW (ref 3.5–5.0)
Alkaline Phosphatase: 277 U/L — ABNORMAL HIGH (ref 38–126)
Anion gap: 12 (ref 5–15)
BUN: 13 mg/dL (ref 6–20)
CO2: 23 mmol/L (ref 22–32)
Calcium: 8 mg/dL — ABNORMAL LOW (ref 8.9–10.3)
Chloride: 101 mmol/L (ref 98–111)
Creatinine, Ser: 0.59 mg/dL (ref 0.44–1.00)
GFR calc Af Amer: >60 mL/min (ref >60)
GFR calc non Af Amer: >60 mL/min (ref >60)
Glucose, Bld: 121 mg/dL — ABNORMAL HIGH (ref 70–99)
Potassium: 3.6 mmol/L (ref 3.5–5.1)
Sodium: 136 mmol/L (ref 135–145)
Total Bilirubin: 1.1 mg/dL (ref 0.3–1.2)
Total Protein: 6.8 g/dL (ref 6.5–8.1)

## 2019-12-14 LAB — HEPARIN LEVEL (UNFRACTIONATED)
Heparin Unfractionated: 0.32 IU/mL (ref 0.30–0.70)
Heparin Unfractionated: 0.23 IU/mL — ABNORMAL LOW (ref 0.30–0.70)
Heparin Unfractionated: 0.2 IU/mL — ABNORMAL LOW (ref 0.30–0.70)

## 2019-12-14 LAB — CBC
HCT: 32.5 % — ABNORMAL LOW (ref 36.0–46.0)
Hemoglobin: 9.9 g/dL — ABNORMAL LOW (ref 12.0–15.0)
MCH: 25.6 pg — ABNORMAL LOW (ref 26.0–34.0)
MCHC: 30.5 g/dL (ref 30.0–36.0)
MCV: 84 fL (ref 80.0–100.0)
Platelets: 574 10*3/uL — ABNORMAL HIGH (ref 150–400)
RBC: 3.87 MIL/uL (ref 3.87–5.11)
RDW: 22.6 % — ABNORMAL HIGH (ref 11.5–15.5)
WBC: 12.7 10*3/uL — ABNORMAL HIGH (ref 4.0–10.5)
nRBC: 0 % (ref 0.0–0.2)

## 2019-12-14 LAB — HEMOGLOBIN AND HEMATOCRIT, BLOOD
HCT: 30.2 % — ABNORMAL LOW (ref 36.0–46.0)
HCT: 28.8 % — ABNORMAL LOW (ref 36.0–46.0)
Hemoglobin: 9.3 g/dL — ABNORMAL LOW (ref 12.0–15.0)
Hemoglobin: 9 g/dL — ABNORMAL LOW (ref 12.0–15.0)

## 2019-12-14 MED ORDER — SODIUM CHLORIDE 0.9% FLUSH
10.0000 mL | INTRAVENOUS | Status: DC | PRN
  Administered 2019-12-17: 10 mL

## 2019-12-14 MED ORDER — PANTOPRAZOLE SODIUM 40 MG PO TBEC
40.0000 mg | DELAYED_RELEASE_TABLET | Freq: Every day | ORAL | Status: DC
  Administered 2019-12-14: 40 mg via ORAL
  Filled 2019-12-14: qty 1

## 2019-12-14 MED ORDER — CHLORHEXIDINE GLUCONATE CLOTH 2 % EX PADS
6.0000 | MEDICATED_PAD | Freq: Every day | CUTANEOUS | Status: DC
  Administered 2019-12-14 – 2019-12-17 (×4): 6 via TOPICAL

## 2019-12-14 MED ORDER — HEPARIN (PORCINE) 25000 UT/250ML-% IV SOLN: 1200.0000 [IU]/h | INTRAVENOUS | Status: DC

## 2019-12-14 MED ORDER — HEPARIN (PORCINE) 25000 UT/250ML-% IV SOLN
1100.0000 [IU]/h | INTRAVENOUS | Status: DC
  Administered 2019-12-14: 1100 [IU]/h via INTRAVENOUS
  Filled 2019-12-14: qty 250

## 2019-12-14 NOTE — TOC Progression Note (Signed)
Transition of Care [facility]) - Progression Note    Patient Details  Name: Anita Hoffman MRN: [id number] Date of Birth: 07-18-1961  Transition of Care [facility]) CM/SW Contact  Purcell Mouton, RN Phone Number: 12/14/2019, 1:46 PM  Clinical Narrative:    Spoke with pt concerning discharge plans. Pt states that she will not need HH at present time.    Expected Discharge Plan: Home/Self Care Barriers to Discharge: No Barriers Identified  Expected Discharge Plan and Services Expected Discharge Plan: Home/Self Care   Discharge Planning Services: CM Consult   Living arrangements for the past 2 months: Single Family Home Expected Discharge Date: (unknown)                                     Social Determinants of Health (SDOH) Interventions    Readmission Risk Interventions No flowsheet data found.

## 2019-12-14 NOTE — Progress Notes (Signed)
VAST RN spoke with pt's nurse during shift change. Asked if 1930 heparin level and  2200 H&H could be collected at same time to reduce infection risk through port by minimizing breaks in line. Unit RN asked that VAST RN place order and notify lab that all further blood work will be collected through port. Lab notified that IVT will collect all labs until further notice.  Order placed for labs to be collected around 2100.

## 2019-12-14 NOTE — Progress Notes (Signed)
[facility]  PROGRESS NOTE  Anita Hoffman:[id number] DOB: August 19, 1960 DOA: 12/13/2019 PCP: Patient, No Pcp Per   Brief HPI:   59 year old female with a history of recent diagnosis of metastatic adenocarcinoma cecum with liver metastasis, pulmonary embolism left gastrocnemius vein thrombosis she was discharged a week ago on Lovenox.  Had a chemo infusion done on 12/12/2019 and came to ED with complaints of rectal bleeding.  GI was consulted and recommended observation.    Subjective   Patient seen and examined, no more bleeding per rectum.  Oncology has seen the patient.   Assessment/Plan:     1. Hematochezia-resented with bright red blood per rectum in setting of cecal adenocarcinoma.  In the setting of anticoagulation for bilateral PE.  GI was consulted and advised monitoring for observation.  Serial H&H.  Dr. Marin Olp has seen and is concerned that she may be bleeding from the tumor and she may need resection of the tumor if she continues to bleed.  Will monitor. 2. Recent bilateral pulmonary embolism/left gastrocnemius DVT-patient was discharged on Lovenox 80 mg twice daily.  She is off Lovenox and started on heparin GTT. 3. 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 Marin Olp as outpatient.  She is on FOLFOX.   4. Thrombocytopenia-likely in setting of chemotherapy. 5. Iron deficiency anemia-patient received Feraheme dose before coming to the hospital. 6. His HIV-resumed HIV medication Biktarvy 7. Transaminitis-patient has elevated LFTs with alk phos 277, AST 295, ALT 77.    SpO2: 98 %   COVID-19 Labs  No results for input(s): DDIMER, FERRITIN, LDH, CRP in the last 72 hours.  Lab Results  Component Value Date   SARSCOV2NAA NEGATIVE 12/13/2019   [place] NEGATIVE 11/29/2019     CBG: No results for input(s): GLUCAP in the last 168 hours.  CBC: Recent Labs  Lab 12/12/19 0900 12/13/19 1043 12/13/19 1401 12/13/19 2247  12/14/19 0441  WBC 10.9* 15.2*  --   --  12.7*  NEUTROABS 8.6* 13.5*  --   --   --   HGB 9.2* 10.0* 9.5* 9.5* 9.9*  HCT 29.5* 32.7* 30.8* 30.6* 32.5*  MCV 81.0 83.4  --   --  84.0  PLT 491* 616*  --   --  574*    Basic Metabolic Panel: Recent Labs  Lab 12/12/19 0900 12/13/19 1043 12/14/19 0441  NA 134* 133* 136  K 3.4* 3.5 3.6  CL 97* 98 101  CO2 _0 GLUCOSE 105* 148* 121*  BUN _1 CREATININE 0.54 0.68 0.59  CALCIUM 8.4* 8.1* 8.0*     Liver Function Tests: Recent Labs  Lab 12/12/19 0900 12/13/19 1043 12/14/19 0441  AST 63* 59* 295*  ALT 23 28 77*  ALKPHOS 254* 265* 277*  BILITOT 0.7 0.9 1.1  PROT 6.8 7.4 6.8  ALBUMIN 3.1* 2.6* 2.4*        DVT prophylaxis: SCDs/heparin  Code Status: Full code  Family Communication: No family at bedside  Disposition Plan:   Status is: Observation  Dispo: The patient is from: Home              Anticipated d/c is to: Home              Anticipated d/c date is: 12/17/2019              Patient currently admitted with rectal bleeding.  Barrier to discharge-might need surgical evaluation for colon surgery if she continues to bleed.  Scheduled medications:  . bictegravir-emtricitabine-tenofovir AF  1 tablet Oral Daily  . Chlorhexidine Gluconate Cloth  6 each Topical Daily  . pantoprazole  40 mg Oral Q1200    Consultants:  Oncology  Procedures:    Antibiotics:   Anti-infectives (From admission, onward)   Start     Dose/Rate Route Frequency Ordered Stop   12/13/19 1600  bictegravir-emtricitabine-tenofovir AF (BIKTARVY) 50-200-25 MG per tablet 1 tablet     1 tablet Oral Daily 12/13/19 1233         Objective   Vitals:   12/13/19 1847 12/13/19 2113 12/14/19 0417 12/14/19 1325  BP: 111/80 120/88 126/78 108/74  Pulse: 77 75 89 93  Resp: _0 Temp: 97.6 F (36.4 C) 97.6 F (36.4 C) 98 F (36.7 C) 98.7 F (37.1 C)  TempSrc: Oral Oral Oral Oral  SpO2: 100% 98% 100% 98%   Weight:      Height:        Intake/Output Summary (Last 24 hours) at 12/14/2019 1405 Last data filed at 12/14/2019 1540 Gross per 24 hour  Intake 131.16 ml  Output --  Net 131.16 ml    05/12 1901 - 05/14 0700 In: 131.2 [I.V.:131.2] Out: -   Filed Weights   12/13/19 1656  Weight: 75.5 kg    Physical Examination:   General-appears in no acute distress Heart-S1-S2, regular, no murmur auscultated Lungs-clear to auscultation bilaterally, no wheezing or crackles auscultated Abdomen-soft, nontender, no organomegaly Extremities-no edema in the lower extremities Neuro-alert, oriented x3, no focal deficit noted   Data Reviewed:   Recent Results (from the past 240 hour(s))  SARS Coronavirus 2 by RT PCR (hospital order, performed in [place] hospital lab) Nasopharyngeal Nasopharyngeal Swab     Status: None   Collection Time: 12/13/19 12:11 PM   Specimen: Nasopharyngeal Swab  Result Value Ref Range Status   SARS Coronavirus 2 NEGATIVE NEGATIVE Final    Comment: (NOTE) SARS-CoV-2 target nucleic acids are NOT DETECTED. The SARS-CoV-2 RNA is generally detectable in upper and lower respiratory specimens during the acute phase of infection. The lowest concentration of SARS-CoV-2 viral copies this assay can detect is 250 copies / mL. A negative result does not preclude SARS-CoV-2 infection and should not be used as the sole basis for treatment or other patient management decisions.  A negative result may occur with improper specimen collection / handling, submission of specimen other than nasopharyngeal swab, presence of viral mutation(s) within the areas targeted by this assay, and inadequate number of viral copies (<250 copies / mL). A negative result must be combined with clinical observations, patient history, and epidemiological information. Fact Sheet for Patients:   StrictlyIdeas.no Fact Sheet for Healthcare  Providers: BankingDealers.co.za This test is not yet approved or cleared  by the [place] FDA and has been authorized for detection and/or diagnosis of SARS-CoV-2 by FDA under an Emergency Use Authorization (EUA).  This EUA will remain in effect (meaning this test can be used) for the duration of the COVID-19 declaration under Section 564(b)(1) of the Act, 21 U.S.C. section 360bbb-3(b)(1), unless the authorization is terminated or revoked sooner. Performed at [facility], [place] [address] [place], [place] [address]         Oswald Hillock   [facility] If 7PM-7AM, please contact night-coverage at [email or web address], Office  [phone]   12/14/2019, 2:05 PM  LOS: 0 days

## 2019-12-14 NOTE — Consult Note (Signed)
Referral MD  Reason for Referral: Rectal bleeding-likely secondary to cecal mass; metastatic colon cancer; history of pulmonary embolism and left lower extremity thrombus.  Chief Complaint  Patient presents with  . Rectal Bleeding  : I had rectal bleeding yesterday morning.  HPI: Anita Hoffman is well-known to me.  She is very nice 59 year old African-American female.  She has metastatic colon cancer.  She initially presented with a pulmonary embolism and left lower extremity thrombus.  This was a couple weeks ago.  We started her on systemic chemotherapy with FOLFOXIRI.  She had this on May 13.  She called [place] in the office on May 14.  She woke up.  She has some abdominal cramping.  She then had bright red blood per rectum.  She is on Lovenox.  She has to be on Lovenox because of the extensive pulmonary embolism that she has.  She subsequently went to the emergency room.  She had lab work done.  Her labs showed a white cell count of 15.2.  Hemoglobin 10.  Platelet count 616,000.  Her creatinine is 0.68.  Blood sugar 148.  Albumin 2.6.  She has been seen by gastroenterology.  They do not seem to think that she needs a colonoscopy.  They says she needs an IVC filter.  She has had no other episodes of bleeding.  She has some abdominal cramps.  This might be from the irinotecan that she received.  Her labs today show white cell count 12.7.  Hemoglobin 9.9.  Platelet count 574,000.  Her BUN is 13 creatinine 0.59.  Albumin 2.4.  Bilirubin 1.1.  She has had no fever.  There is no chest wall pain.  She has had no cough.  There is no vomiting.  She has had no problems with her urine.  There is been no leg swelling.  Overall, I would say her performance status is ECOG 1.     Past Medical History:  Diagnosis Date  . Former smoker   . Goals of care, counseling/discussion 12/06/2019  . HIV infection ([place])   . Iron deficiency anemia due to chronic blood loss 12/12/2019  . Medical history  non-contributory   :  Past Surgical History:  Procedure Laterality Date  . BIOPSY  12/01/2019   Procedure: BIOPSY;  Surgeon: Lavena Bullion, DO;  Location: [facility];  Service: Gastroenterology;;  . COLONOSCOPY WITH PROPOFOL N/A 12/01/2019   Procedure: COLONOSCOPY WITH PROPOFOL;  Surgeon: Lavena Bullion, DO;  Location: [facility];  Service: Gastroenterology;  Laterality: N/A;  . HEMOSTASIS CLIP PLACEMENT  12/01/2019   Procedure: HEMOSTASIS CLIP PLACEMENT;  Surgeon: Lavena Bullion, DO;  Location: [facility];  Service: Gastroenterology;;  . IR IMAGING GUIDED PORT INSERTION  12/06/2019  . POLYPECTOMY  12/01/2019   Procedure: POLYPECTOMY;  Surgeon: Lavena Bullion, DO;  Location: [facility];  Service: Gastroenterology;;  . SUBMUCOSAL TATTOO INJECTION  12/01/2019   Procedure: SUBMUCOSAL TATTOO INJECTION;  Surgeon: Lavena Bullion, DO;  Location: [facility];  Service: Gastroenterology;;  . TUBAL LIGATION    :   Current Facility-Administered Medications:  .  acetaminophen (TYLENOL) tablet 650 mg, 650 mg, Oral, Q6H PRN **OR** acetaminophen (TYLENOL) suppository 650 mg, 650 mg, Rectal, Q6H PRN, Kc, Ramesh, MD .  bictegravir-emtricitabine-tenofovir AF (BIKTARVY) 50-200-25 MG per tablet 1 tablet, 1 tablet, Oral, Daily, Kc, Ramesh, MD, 1 tablet at 12/13/19 1549 .  Chlorhexidine Gluconate Cloth 2 % PADS 6 each, 6 each, Topical, Daily, Kc, Ramesh, MD .  heparin ADULT  infusion 100 units/mL (25000 units/222mL sodium chloride 0.45%), 1,000 Units/hr, Intravenous, Continuous, [place], Cindie Laroche, RPH, Last Rate: 10 mL/hr at 12/14/19 0323, 1,000 Units/hr at 12/14/19 0323 .  HYDROcodone-acetaminophen (NORCO/VICODIN) 5-325 MG per tablet 1-2 tablet, 1-2 tablet, Oral, Q4H PRN, Kc, Ramesh, MD .  loperamide (IMODIUM) capsule 2 mg, 2 mg, Oral, QID PRN, Kc, Ramesh, MD .  ondansetron (ZOFRAN) tablet 4 mg, 4 mg, Oral, Q6H PRN **OR** ondansetron (ZOFRAN) injection 4 mg, 4 mg, Intravenous, Q6H PRN, Kc,  Ramesh, MD .  sodium chloride flush (NS) 0.9 % injection 10-40 mL, 10-40 mL, Intracatheter, PRN, Antonieta Pert, MD:  . bictegravir-emtricitabine-tenofovir AF  1 tablet Oral Daily  . Chlorhexidine Gluconate Cloth  6 each Topical Daily  :  No Known Allergies:  Family History  Problem Relation Age of Onset  . Ovarian cancer Maternal Grandmother   . Lung cancer Father   :  Social History   Socioeconomic History  . Marital status: Single    Spouse name: Not on file  . Number of children: 5  . Years of education: Not on file  . Highest education level: Not on file  Occupational History  . Not on file  Tobacco Use  . Smoking status: Former Smoker    Packs/day: 0.50    Types: Cigarettes    Quit date: 11/29/2003    Years since quitting: 16.0  . Smokeless tobacco: Never Used  Substance and Sexual Activity  . Alcohol use: Not Currently    Comment: 1990  . Drug use: Yes    Types: Marijuana    Comment: 1990  . Sexual activity: Not Currently  Other Topics Concern  . Not on file  Social History Narrative  . Not on file   Social Determinants of Health   Financial Resource Strain:   . Difficulty of Paying Living Expenses:   Food Insecurity:   . Worried About Charity fundraiser in the Last Year:   . Arboriculturist in the Last Year:   Transportation Needs:   . Film/video editor (Medical):   Marland Kitchen Lack of Transportation (Non-Medical):   Physical Activity:   . Days of Exercise per Week:   . Minutes of Exercise per Session:   Stress:   . Feeling of Stress :   Social Connections:   . Frequency of Communication with Friends and Family:   . Frequency of Social Gatherings with Friends and Family:   . Attends Religious Services:   . Active Member of Clubs or Organizations:   . Attends Archivist Meetings:   Marland Kitchen Marital Status:   Intimate Partner Violence:   . Fear of Current or Ex-Partner:   . Emotionally Abused:   Marland Kitchen Physically Abused:   . Sexually Abused:    :  Review of Systems  Constitutional: Negative.   HENT: Negative.   Eyes: Negative.   Respiratory: Negative.   Cardiovascular: Negative.   Gastrointestinal: Positive for abdominal pain and blood in stool.  Genitourinary: Negative.   Musculoskeletal: Negative.   Skin: Negative.   Neurological: Negative.   Endo/Heme/Allergies: Negative.   Psychiatric/Behavioral: Negative.      Exam:  Well-developed and well-nourished African-American female.  She is alert and oriented.  Her vital signs show temperature 98.  Pulse 89.  Blood pressure 126/78.  Her head and neck exam shows no scleral icterus.  There is no mucositis in the oral cavity.  She has no adenopathy in the neck.  Lungs are clear.  Cardiac  exam regular rate and rhythm.  Abdomen is soft.  There may be some tenderness in the right upper quadrant.  There is no fluid wave.  There is no guarding or rebound tenderness.  She does have a liver edge just below the right costal margin.  Extremities shows no clubbing, cyanosis or edema.  She has good range of motion of her joints.  Skin exam shows no rashes, ecchymoses or petechia.  Neurological exam is nonfocal.   Patient Vitals for the past 24 hrs:  BP Temp Temp src Pulse Resp SpO2 Height Weight  12/14/19 0417 126/78 98 F (36.7 C) Oral 89 18 100 % -- --  12/13/19 2113 120/88 97.6 F (36.4 C) Oral 75 18 98 % -- --  12/13/19 1847 111/80 97.6 F (36.4 C) Oral 77 15 100 % -- --  12/13/19 1656 -- -- -- -- -- -- 5\' 2"  (1.575 m) 166 lb 7.2 oz (75.5 kg)  12/13/19 1649 106/81 97.7 F (36.5 C) Oral 72 14 100 % -- --  12/13/19 1554 108/79 -- -- 79 15 100 % -- --  12/13/19 1500 103/80 -- -- 81 15 99 % -- --  12/13/19 1400 102/81 -- -- 81 16 97 % -- --  12/13/19 1330 107/79 -- -- 82 15 99 % -- --  12/13/19 1245 103/73 -- -- 79 14 99 % -- --  12/13/19 1219 112/79 -- -- 79 15 100 % -- --  12/13/19 1145 108/81 -- -- 84 16 100 % -- --  12/13/19 1130 102/76 -- -- 83 15 100 % -- --  12/13/19 1117  97/75 -- -- (!) 109 15 100 % -- --  12/13/19 1043 100/80 -- -- 92 18 99 % -- --  12/13/19 0918 124/90 (!) 97.5 F (36.4 C) Oral (!) 113 18 100 % -- --     Recent Labs    12/13/19 1043 12/13/19 1401 12/13/19 2247 12/14/19 0441  WBC 15.2*  --   --  12.7*  HGB 10.0*   < > 9.5* 9.9*  HCT 32.7*   < > 30.6* 32.5*  PLT 616*  --   --  574*   < > = values in this interval not displayed.   Recent Labs    12/13/19 1043 12/14/19 0441  NA 133* 136  K 3.5 3.6  CL 98 101  CO2 22 23  GLUCOSE 148* 121*  BUN 10 13  CREATININE 0.68 0.59  CALCIUM 8.1* 8.0*    Blood smear review: None  Pathology: None    Assessment and Plan: Ms. Ricarte is a very charming 59 year old African-American female with metastatic colon cancer.  She has a cecal mass that was the primary.  She has had 1 cycle of chemotherapy.  She is on Lovenox for her history of thromboembolic disease.  She is on heparin in the hospital which is perfect.  I think the real question is whether or not she needs to have the primary in the cecum removed.  She is not bleeding right now.  I think she does have another episode of bleeding, then the cecal mass might need to come out.  I understand the issues because of her metastatic disease.  For right now, I would just keep her on the heparin.   Anita Haw, MD  Hebrew 6:10

## 2019-12-14 NOTE — Progress Notes (Signed)
VAST received consult to flush pt's right chest port as her CADD infusion is complete. See flowsheet for care provided.

## 2019-12-14 NOTE — Plan of Care (Signed)
  Problem: Elimination: Goal: Will not experience complications related to bowel motility Outcome: Progressing Goal: Will not experience complications related to urinary retention Outcome: Progressing   Problem: Safety: Goal: Ability to remain free from injury will improve Outcome: Progressing   

## 2019-12-14 NOTE — Progress Notes (Signed)
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  She presented to the ED on 5/13 with c/o diarrhea and bloody stools.  She was transitioned to heparin drip on admission.  Per GI team, "If patient continues to have lower GI bleeding with worsening anemia, anticoagulation must be stopped and IVC filter placed. If anticoagulation cannot be stopped for some reason, then the only option at that point would be high risk palliative right colon resection."  - heparin level is therapeutic at 0.32 with current rate of 1000 units/hr.  Goal of Therapy:  Heparin level 0.3-0.5 units/ml Monitor platelets by anticoagulation protocol: Yes    Plan: - continue heparin drip at 1000 units/hr - With heparin level at the lower end of a narrow therapeutic range, will recheck another level at 11am to ensure level is still therapeutic before changing to daily monitoring - monitor for s/sx bleeding  Dia Sitter, PharmD, BCPS 12/14/2019 5:22 AM

## 2019-12-14 NOTE — Progress Notes (Signed)
Called unit and spoke with pt's nurse, Jackelyn Poling. Pt is receiving Fluorouracil through her chest port via CADD pump from home. Educated nurse that while this drug is infusing, pt must have labs drawn via venipuncture. Requested VAST consult be placed once drug has finished infusing so labs may be collected from port to minimize patient discomfort and risk for infection from multiple venipunctures. Debbie verbalized understanding.

## 2019-12-14 NOTE — Progress Notes (Signed)
Hematology note reviewed.  Unfortunately, GI service cannot offer anything further at this point.  Signing off - call as needed.

## 2019-12-14 NOTE — Progress Notes (Addendum)
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  She presented to the ED on 5/13 with c/o diarrhea and bloody stools.  Patient was transitioned to heparin drip on admission.  Per GI team, "If patient continues to have lower GI bleeding with worsening anemia, anticoagulation must be stopped and IVC filter placed. If anticoagulation cannot be stopped for some reason, then the only option at that point would be high risk palliative right colon resection."  - heparin level is sub-therapeutic at 0.20 with current rate of 1100 units/hr. - hgb down 9 - per pt's RN, no issues with IV line and no new bleeding noted  Goal of Therapy: Heparin level 0.3-0.5units/ml Monitor platelets by anticoagulation protocol: Yes   Plan: - increase heparin drip to 1200 units/hr - check 6 hr heparin level  - monitor for s/sx bleeding  Dia Sitter, PharmD, BCPS 12/14/2019 11:00 PM  ______________________________  Adden: Heparin level came back subtherapeutic at 0.22 with current rate of 1200 units/hr - increase heparin drip to 1400 units/hr - check 6 hr heparin level  Dia Sitter, PharmD, BCPS 12/15/2019 5:32 AM

## 2019-12-14 NOTE — Progress Notes (Signed)
ANTICOAGULATION CONSULT NOTE  Pharmacy Consult for Heparin Indication: pulmonary embolus  No Known Allergies  Patient Measurements: Height: 5\' 2"  (157.5 cm) Weight: 75.5 kg (166 lb 7.2 oz) IBW/kg (Calculated) : 50.1 Heparin Dosing Weight: 66 kg  Vital Signs: Temp: 98 F (36.7 C) (05/14 0417) Temp Source: Oral (05/14 0417) BP: 126/78 (05/14 0417) Pulse Rate: 89 (05/14 0417)  Labs: Recent Labs    12/12/19 0900 12/12/19 0900 12/13/19 1043 12/13/19 1043 12/13/19 1401 12/13/19 1401 12/13/19 1938 12/13/19 2247 12/14/19 0441 12/14/19 1138  HGB 9.2*  --  10.0*   < > 9.5*   < >  --  9.5* 9.9*  --   HCT 29.5*   < > 32.7*   < > 30.8*  --   --  30.6* 32.5*  --   PLT 491*  --  616*  --   --   --   --   --  574*  --   APTT  --   --  36  --   --   --   --   --   --   --   LABPROT  --   --  14.2  --   --   --   --   --   --   --   INR  --   --  1.1  --   --   --   --   --   --   --   HEPARINUNFRC  --   --   --   --  0.49   < > 0.55  --  0.32 0.23*  CREATININE 0.54  --  0.68  --   --   --   --   --  0.59  --    < > = values in this interval not displayed.    Estimated Creatinine Clearance: 73.9 mL/min (by C-G formula based on SCr of 0.59 mg/dL).   Medications:  Lovenox 80 mg q 12 h PTA, last dose 5/12 @ 5 pm  Assessment: 58 y/o F with recent diagnosis of DVT/PE with right heart strain on Lovenox 1 mg/kg q 12 h prior to admission admitted for lower GIB. 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 Per GI, patient has a large obstructing/ulcerated cecal mass. Anemia stable after transfusion. GI following.  Today, 12/14/2019:  CBC: Hgb (9.9) remains low but stable; Plt (574) elevated  HL = 0.23 is subtherapeutic on heparin infusion of 1000 units/hr  Confirmed with RN that heparin infusing at correct rate with no pauses/interruptions. RN reports no additional incidences of rectal  bleeding.   Goal of Therapy:  Heparin level 0.3-0.5 units/ml Monitor platelets by anticoagulation protocol: Yes   Plan:   Target lower end of goal range with bleeding (but would still prefer to stay in the 0.4-0.5 units/mL range given PE w/ RH strain)  No heparin bolus given rectal bleeding  Increase heparin infusion to 1100 units/hr  Check HL in 6 hours  Daily CBC, HL  Monitor for signs of worsening bleeding or thrombosis  Lenis Noon, PharmD 12/14/19 12:12 PM

## 2019-12-15 DIAGNOSIS — C180 Malignant neoplasm of cecum: Principal | ICD-10-CM

## 2019-12-15 LAB — HEMOGLOBIN AND HEMATOCRIT, BLOOD
HCT: 27.5 % — ABNORMAL LOW (ref 36.0–46.0)
HCT: 34.5 % — ABNORMAL LOW (ref 36.0–46.0)
Hemoglobin: 8.5 g/dL — ABNORMAL LOW (ref 12.0–15.0)
Hemoglobin: 10.9 g/dL — ABNORMAL LOW (ref 12.0–15.0)

## 2019-12-15 LAB — CBC
HCT: 28.5 % — ABNORMAL LOW (ref 36.0–46.0)
Hemoglobin: 8.7 g/dL — ABNORMAL LOW (ref 12.0–15.0)
MCH: 25.7 pg — ABNORMAL LOW (ref 26.0–34.0)
MCHC: 30.5 g/dL (ref 30.0–36.0)
MCV: 84.1 fL (ref 80.0–100.0)
Platelets: 452 10*3/uL — ABNORMAL HIGH (ref 150–400)
RBC: 3.39 MIL/uL — ABNORMAL LOW (ref 3.87–5.11)
RDW: 23 % — ABNORMAL HIGH (ref 11.5–15.5)
WBC: 10.3 10*3/uL (ref 4.0–10.5)
nRBC: 0 % (ref 0.0–0.2)

## 2019-12-15 LAB — PREPARE RBC (CROSSMATCH)

## 2019-12-15 LAB — HEPARIN LEVEL (UNFRACTIONATED): Heparin Unfractionated: 0.22 IU/mL — ABNORMAL LOW (ref 0.30–0.70)

## 2019-12-15 MED ORDER — FUROSEMIDE 10 MG/ML IJ SOLN
20.0000 mg | Freq: Once | INTRAMUSCULAR | Status: AC
  Administered 2019-12-15: 20 mg via INTRAVENOUS

## 2019-12-15 MED ORDER — HEPARIN (PORCINE) 25000 UT/250ML-% IV SOLN: 1400.0000 [IU]/h | INTRAVENOUS | Status: DC

## 2019-12-15 MED ORDER — SODIUM CHLORIDE 0.9% IV SOLUTION: Freq: Once | INTRAVENOUS | Status: AC

## 2019-12-15 MED ORDER — PANTOPRAZOLE SODIUM 40 MG PO TBEC
40.0000 mg | DELAYED_RELEASE_TABLET | Freq: Two times a day (BID) | ORAL | Status: DC
  Administered 2019-12-15 – 2019-12-17 (×5): 40 mg via ORAL
  Filled 2019-12-15 (×5): qty 1

## 2019-12-15 MED ORDER — FUROSEMIDE 10 MG/ML IJ SOLN
20.0000 mg | Freq: Once | INTRAMUSCULAR | Status: DC
  Filled 2019-12-15: qty 2

## 2019-12-15 MED ORDER — FUROSEMIDE 10 MG/ML IJ SOLN
20.0000 mg | Freq: Once | INTRAMUSCULAR | Status: AC
  Administered 2019-12-15: 20 mg via INTRAVENOUS
  Filled 2019-12-15: qty 2

## 2019-12-15 MED ORDER — ENOXAPARIN SODIUM 80 MG/0.8ML ~~LOC~~ SOLN
1.0000 mg/kg | Freq: Two times a day (BID) | SUBCUTANEOUS | Status: DC
  Administered 2019-12-15 – 2019-12-17 (×5): 75 mg via SUBCUTANEOUS
  Filled 2019-12-15 (×5): qty 0.8

## 2019-12-15 NOTE — Progress Notes (Signed)
Anita Hoffman she has been doing little bit better. She is not having any obvious bleeding.  She really needs to be on anticoagulation. She presented with thromboembolic disease from her cancer. As such, she is incredibly hypercoagulable from her cancer. An IVC filter would do [place] know good because she already has the pulmonary embolism.  I will switch her over to Lovenox again. We will see how she does with Lovenox.  I will also transfuse her 2 units of blood. I really think she needs to be transfused. I still think she is going be able to mount a good erythropoietic response because of the chemotherapy and the underlying malignancy.  I really do not think she needs to be on a cardiac monitor.  She is still having some right upper quadrant abdominal pain.  She is having no cough or shortness of breath. There is no chest wall pain.  Her vital signs show a temperature 98.8. Pulse 86. Blood pressure 99/70. Her lungs sound clear bilaterally. Cardiac exam regular rate and rhythm with no murmurs, rubs or bruits. Abdomen is soft. There is decreased bowel sounds. There is some slight discomfort in the right upper quadrant. Extremities shows no clubbing, cyanosis or edema.  Anita Hoffman is quite complicated. She has a metastatic colon cancer. She is hypercoagulable. She has the lower GI bleeding likely from her malignancy in the cecum.  Again, this is somebody who must be on anticoagulation because of her underlying cancer. I realize that this is somewhat complicated.  I think if she does have bleeding again, then this might be an indication for surgery to remove the source of bleeding which would be the cecal mass.  I know that she is getting outstanding care from all the staff on [facility] [address]. I very much appreciate their compassion and there professional approach to Anita Hoffman.  Lattie Haw, MD  Michaelyn Barter 2:10

## 2019-12-15 NOTE — Progress Notes (Signed)
[facility]  PROGRESS NOTE  Anita Hoffman VXY:[id number] DOB: 12-12-1960 DOA: 12/13/2019 PCP: Patient, No Pcp Per   Brief HPI:   59 year old female with a history of recent diagnosis of metastatic adenocarcinoma cecum with liver metastasis, pulmonary embolism left gastrocnemius vein thrombosis she was discharged a week ago on Lovenox.  Had a chemo infusion done on 12/12/2019 and came to ED with complaints of rectal bleeding.  GI was consulted and recommended observation.    Subjective   Patient seen and examined, no more bloody bowel movement.  Oncology has seen the patient and ordered 2 units PRBC.  Plan to monitor for rectal bleeding.   Assessment/Plan:     1. Hematochezia-resented with bright red blood per rectum in setting of cecal adenocarcinoma.  In the setting of anticoagulation for bilateral PE.  GI was consulted and advised monitoring for observation.  Serial H&H.  Dr. Marin Olp has seen and is concerned that she may be bleeding from the tumor and she may need resection of the tumor if she continues to bleed.  Will monitor. 2. Anemia-secondary to ABL, 2 units PRBC ordered by Dr. Luetta Nutting.  Follow CBC in a.m. 3. Recent bilateral pulmonary embolism/left gastrocnemius DVT-patient was discharged on Lovenox 80 mg twice daily.  She is off Lovenox and started on heparin GTT. 4. 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 Marin Olp as outpatient.  She is on FOLFOX.   5. Thrombocytopenia-likely in setting of chemotherapy. 6. Iron deficiency anemia-patient received Feraheme dose before coming to the hospital. 7. His HIV-resumed HIV medication Biktarvy 8. Transaminitis-patient has elevated LFTs with alk phos 277, AST 295, ALT 77.  Unclear etiology.?  Liver mets.  Follow LFTs in a.m.    SpO2: 97 %   COVID-19 Labs  No results for input(s): DDIMER, FERRITIN, LDH, CRP in the last 72 hours.  Lab Results  Component Value Date   SARSCOV2NAA NEGATIVE  12/13/2019   [place] NEGATIVE 11/29/2019     CBG: No results for input(s): GLUCAP in the last 168 hours.  CBC: Recent Labs  Lab 12/12/19 0900 12/12/19 0900 12/13/19 1043 12/13/19 1401 12/14/19 0441 12/14/19 1410 12/14/19 2106 12/15/19 0441 12/15/19 0758  WBC 10.9*  --  15.2*  --  12.7*  --   --  10.3  --   NEUTROABS 8.6*  --  13.5*  --   --   --   --   --   --   HGB 9.2*  --  10.0*   < > 9.9* 9.3* 9.0* 8.7* 8.5*  HCT 29.5*   < > 32.7*   < > 32.5* 30.2* 28.8* 28.5* 27.5*  MCV 81.0  --  83.4  --  84.0  --   --  84.1  --   PLT 491*  --  616*  --  574*  --   --  452*  --    < > = values in this interval not displayed.    Basic Metabolic Panel: Recent Labs  Lab 12/12/19 0900 12/13/19 1043 12/14/19 0441  NA 134* 133* 136  K 3.4* 3.5 3.6  CL 97* 98 101  CO2 _0 GLUCOSE 105* 148* 121*  BUN _1 CREATININE 0.54 0.68 0.59  CALCIUM 8.4* 8.1* 8.0*     Liver Function Tests: Recent Labs  Lab 12/12/19 0900 12/13/19 1043 12/14/19 0441  AST 63* 59* 295*  ALT 23 28 77*  ALKPHOS 254* 265* 277*  BILITOT 0.7 0.9 1.1  PROT 6.8 7.4 6.8  ALBUMIN 3.1* 2.6* 2.4*        DVT prophylaxis: SCDs/heparin  Code Status: Full code  Family Communication: No family at bedside  Disposition Plan:   Status is: Observation  Dispo: The patient is from: Home              Anticipated d/c is to: Home              Anticipated d/c date is: 12/17/2019              Patient currently admitted with rectal bleeding.  Barrier to discharge-might need surgical evaluation for colon surgery if she continues to bleed.        Scheduled medications:  . bictegravir-emtricitabine-tenofovir AF  1 tablet Oral Daily  . Chlorhexidine Gluconate Cloth  6 each Topical Daily  . enoxaparin (LOVENOX) injection  1 mg/kg Subcutaneous Q12H  . furosemide  20 mg Intravenous Once  . furosemide  20 mg Intravenous Once  . pantoprazole  40 mg Oral BID     Consultants:  Oncology  Procedures:    Antibiotics:   Anti-infectives (From admission, onward)   Start     Dose/Rate Route Frequency Ordered Stop   12/13/19 1600  bictegravir-emtricitabine-tenofovir AF (BIKTARVY) 50-200-25 MG per tablet 1 tablet     1 tablet Oral Daily 12/13/19 1233         Objective   Vitals:   12/14/19 2058 12/15/19 0602 12/15/19 0957 12/15/19 1030  BP: 105/78 99/70 110/78 104/72  Pulse: 97 86 99 99  Resp: _0 Temp: (!) 101 F (38.3 C) 98.8 F (37.1 C) 98.5 F (36.9 C) 99.3 F (37.4 C)  TempSrc: Oral Oral Oral Oral  SpO2: 100% 97% 99% 97%  Weight:      Height:        Intake/Output Summary (Last 24 hours) at 12/15/2019 1227 Last data filed at 12/15/2019 1030 Gross per 24 hour  Intake 307.4 ml  Output --  Net 307.4 ml    05/13 1901 - 05/15 0700 In: 124.4 [I.V.:124.4] Out: -   Filed Weights   12/13/19 1656  Weight: 75.5 kg    Physical Examination:   General-appears in no acute distress Heart-S1-S2, regular, no murmur auscultated Lungs-clear to auscultation bilaterally, no wheezing or crackles auscultated Abdomen-soft, nontender, no organomegaly Extremities-no edema in the lower extremities Neuro-alert, oriented x3, no focal deficit noted   Data Reviewed:   Recent Results (from the past 240 hour(s))  SARS Coronavirus 2 by RT PCR (hospital order, performed in [place] hospital lab) Nasopharyngeal Nasopharyngeal Swab     Status: None   Collection Time: 12/13/19 12:11 PM   Specimen: Nasopharyngeal Swab  Result Value Ref Range Status   SARS Coronavirus 2 NEGATIVE NEGATIVE Final    Comment: (NOTE) SARS-CoV-2 target nucleic acids are NOT DETECTED. The SARS-CoV-2 RNA is generally detectable in upper and lower respiratory specimens during the acute phase of infection. The lowest concentration of SARS-CoV-2 viral copies this assay can detect is 250 copies / mL. A negative result does not preclude SARS-CoV-2  infection and should not be used as the sole basis for treatment or other patient management decisions.  A negative result may occur with improper specimen collection / handling, submission of specimen other than nasopharyngeal swab, presence of viral mutation(s) within the areas targeted by this assay, and inadequate number of viral copies (<250 copies / mL). A negative result must be combined with clinical observations, patient  history, and epidemiological information. Fact Sheet for Patients:   StrictlyIdeas.no Fact Sheet for Healthcare Providers: BankingDealers.co.za This test is not yet approved or cleared  by the [place] FDA and has been authorized for detection and/or diagnosis of SARS-CoV-2 by FDA under an Emergency Use Authorization (EUA).  This EUA will remain in effect (meaning this test can be used) for the duration of the COVID-19 declaration under Section 564(b)(1) of the Act, 21 U.S.C. section 360bbb-3(b)(1), unless the authorization is terminated or revoked sooner. Performed at [facility], [place] [address], [place], [place] [address]         Oswald Hillock   [facility] If 7PM-7AM, please contact night-coverage at [email or web address], Office  [phone]   12/15/2019, 12:27 PM  LOS: 1 day

## 2019-12-16 LAB — TYPE AND SCREEN
ABO/RH(D): O POS
Antibody Screen: NEGATIVE
Unit division: 0
Unit division: 0

## 2019-12-16 LAB — CBC
HCT: 32.9 % — ABNORMAL LOW (ref 36.0–46.0)
Hemoglobin: 10.5 g/dL — ABNORMAL LOW (ref 12.0–15.0)
MCH: 26.8 pg (ref 26.0–34.0)
MCHC: 31.9 g/dL (ref 30.0–36.0)
MCV: 83.9 fL (ref 80.0–100.0)
Platelets: 327 10*3/uL (ref 150–400)
RBC: 3.92 MIL/uL (ref 3.87–5.11)
RDW: 20.4 % — ABNORMAL HIGH (ref 11.5–15.5)
WBC: 8 10*3/uL (ref 4.0–10.5)
nRBC: 0 % (ref 0.0–0.2)

## 2019-12-16 LAB — COMPREHENSIVE METABOLIC PANEL
ALT: 60 U/L — ABNORMAL HIGH (ref 0–44)
AST: 75 U/L — ABNORMAL HIGH (ref 15–41)
Albumin: 2.2 g/dL — ABNORMAL LOW (ref 3.5–5.0)
Alkaline Phosphatase: 197 U/L — ABNORMAL HIGH (ref 38–126)
Anion gap: 10 (ref 5–15)
BUN: 10 mg/dL (ref 6–20)
CO2: 27 mmol/L (ref 22–32)
Calcium: 7.6 mg/dL — ABNORMAL LOW (ref 8.9–10.3)
Chloride: 99 mmol/L (ref 98–111)
Creatinine, Ser: 0.44 mg/dL (ref 0.44–1.00)
GFR calc Af Amer: >60 mL/min (ref >60)
GFR calc non Af Amer: >60 mL/min (ref >60)
Glucose, Bld: 100 mg/dL — ABNORMAL HIGH (ref 70–99)
Potassium: 3.1 mmol/L — ABNORMAL LOW (ref 3.5–5.1)
Sodium: 136 mmol/L (ref 135–145)
Total Bilirubin: 1.3 mg/dL — ABNORMAL HIGH (ref 0.3–1.2)
Total Protein: 6.1 g/dL — ABNORMAL LOW (ref 6.5–8.1)

## 2019-12-16 LAB — BPAM RBC
Blood Product Expiration Date: 202106102359
Blood Product Expiration Date: 202106122359
ISSUE DATE / TIME: 202105151003
ISSUE DATE / TIME: 202105151534
Unit Type and Rh: 5100
Unit Type and Rh: 5100

## 2019-12-16 LAB — HEMOGLOBIN AND HEMATOCRIT, BLOOD
HCT: 32.7 % — ABNORMAL LOW (ref 36.0–46.0)
HCT: 35.4 % — ABNORMAL LOW (ref 36.0–46.0)
Hemoglobin: 10.4 g/dL — ABNORMAL LOW (ref 12.0–15.0)
Hemoglobin: 11.3 g/dL — ABNORMAL LOW (ref 12.0–15.0)

## 2019-12-16 NOTE — Hospital Course (Signed)
Anita Hoffman

## 2019-12-16 NOTE — Progress Notes (Signed)
[facility]  PROGRESS NOTE  Anita Hoffman MRN:[id number] DOB: 09/29/1960 DOA: 12/13/2019 PCP: Patient, No Pcp Per   Brief HPI:   58-year-old female with a history of recent diagnosis of metastatic adenocarcinoma cecum with liver metastasis, pulmonary embolism left gastrocnemius vein thrombosis she was discharged a week ago on Lovenox.  Had a chemo infusion done on 12/12/2019 and came to ED with complaints of rectal bleeding.  GI was consulted and recommended observation.    Subjective   Patient seen and examined, denies any complaints.  Status post 2 units PRBC.  Hemoglobin has improved today to 10.5.   Assessment/Plan:     1. Hematochezia-resented with bright red blood per rectum in setting of cecal adenocarcinoma.  In the setting of anticoagulation for bilateral PE.  GI was consulted and advised monitoring for observation.  Serial H&H.  Dr. Ennever has seen and is concerned that she may be bleeding from the tumor and she may need resection of the tumor if she continues to bleed.  Will monitor. 2. Anemia-secondary to ABL, 2 units PRBC ordered by Dr. Ennever.  Hemoglobin has improved to 10.5. 3. Recent bilateral pulmonary embolism/left gastrocnemius DVT-patient was discharged on Lovenox 80 mg twice daily.  She is off Lovenox and started on heparin GTT. 4. 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 Ennever as outpatient.  She is on FOLFOX.   5. Thrombocytopenia-likely in setting of chemotherapy. 6. Iron deficiency anemia-patient received Feraheme dose before coming to the hospital. 7. His HIV-resumed HIV medication Biktarvy 8. Transaminitis-patient has elevated LFTs with alk phos 277, AST 295, ALT 77.   LFTs have improved today AST is 75, ALT 60.  Alk phos is 197.  Likely from liver mets    SpO2: 99 %   COVID-19 Labs  No results for input(s): DDIMER, FERRITIN, LDH, CRP in the last 72 hours.  Lab Results  Component Value Date   SARSCOV2NAA  NEGATIVE 12/13/2019   SARSCOV2NAA NEGATIVE 11/29/2019     CBG: No results for input(s): GLUCAP in the last 168 hours.  CBC: Recent Labs  Lab 12/12/19 0900 12/12/19 0900 12/13/19 1043 12/13/19 1401 12/14/19 0441 12/14/19 1410 12/14/19 2106 12/15/19 0441 12/15/19 0758 12/15/19 2214 12/16/19 0558  WBC 10.9*  --  15.2*  --  12.7*  --   --  10.3  --   --  8.0  NEUTROABS 8.6*  --  13.5*  --   --   --   --   --   --   --   --   HGB 9.2*  --  10.0*   < > 9.9*   < > 9.0* 8.7* 8.5* 10.9* 10.5*  10.4*  HCT 29.5*   < > 32.7*   < > 32.5*   < > 28.8* 28.5* 27.5* 34.5* 32.9*  32.7*  MCV 81.0  --  83.4  --  84.0  --   --  84.1  --   --  83.9  PLT 491*  --  616*  --  574*  --   --  452*  --   --  327   < > = values in this interval not displayed.    Basic Metabolic Panel: Recent Labs  Lab 12/12/19 0900 12/13/19 1043 12/14/19 0441 12/16/19 0558  NA 134* 133* 136 136  K 3.4* 3.5 3.6 3.1*  CL 97* 98 101 99  CO2 26 22 23 27  GLUCOSE 105* 148* 121* 100*  BUN 7 10 13 10    CREATININE 0.54 0.68 0.59 0.44  CALCIUM 8.4* 8.1* 8.0* 7.6*     Liver Function Tests: Recent Labs  Lab 12/12/19 0900 12/13/19 1043 12/14/19 0441 12/16/19 0558  AST 63* 59* 295* 75*  ALT 23 28 77* 60*  ALKPHOS 254* 265* 277* 197*  BILITOT 0.7 0.9 1.1 1.3*  PROT 6.8 7.4 6.8 6.1*  ALBUMIN 3.1* 2.6* 2.4* 2.2*        DVT prophylaxis: SCDs/heparin  Code Status: Full code  Family Communication: No family at bedside  Disposition Plan:   Status is: Observation  Dispo: The patient is from: Home              Anticipated d/c is to: Home              Anticipated d/c date is: 12/17/2019              Patient currently admitted with rectal bleeding.  Barrier to discharge-might need surgical evaluation for colon surgery if she continues to bleed.        Scheduled medications:  . bictegravir-emtricitabine-tenofovir AF  1 tablet Oral Daily  . Chlorhexidine Gluconate Cloth  6 each Topical Daily  .  enoxaparin (LOVENOX) injection  1 mg/kg Subcutaneous Q12H  . furosemide  20 mg Intravenous Once  . pantoprazole  40 mg Oral BID    Consultants:  Oncology  Procedures:    Antibiotics:   Anti-infectives (From admission, onward)   Start     Dose/Rate Route Frequency Ordered Stop   12/13/19 1600  bictegravir-emtricitabine-tenofovir AF (BIKTARVY) 50-200-25 MG per tablet 1 tablet     1 tablet Oral Daily 12/13/19 1233         Objective   Vitals:   12/15/19 1600 12/15/19 1903 12/15/19 2012 12/16/19 0616  BP: 100/73 99/71 105/78 111/82  Pulse: 99 84 82 75  Resp: 19 18 14 14  Temp: 98.5 F (36.9 C) 98.3 F (36.8 C) 98.3 F (36.8 C) 97.8 F (36.6 C)  TempSrc: Oral Oral Oral Oral  SpO2: 97% 97% 99% 99%  Weight:      Height:        Intake/Output Summary (Last 24 hours) at 12/16/2019 1200 Last data filed at 12/16/2019 1025 Gross per 24 hour  Intake 1178 ml  Output 3 ml  Net 1175 ml    05/14 1901 - 05/16 0700 In: 1365.4 [P.O.:480; I.V.:117.4] Out: 1 [Urine:1]  Filed Weights   12/13/19 1656  Weight: 75.5 kg    Physical Examination:   General-appears in no acute distress Heart-S1-S2, regular, no murmur auscultated Lungs-clear to auscultation bilaterally, no wheezing or crackles auscultated Abdomen-soft, nontender, no organomegaly Extremities-no edema in the lower extremities Neuro-alert, oriented x3, no focal deficit noted   Data Reviewed:   Recent Results (from the past 240 hour(s))  SARS Coronavirus 2 by RT PCR (hospital order, performed in [place] hospital lab) Nasopharyngeal Nasopharyngeal Swab     Status: None   Collection Time: 12/13/19 12:11 PM   Specimen: Nasopharyngeal Swab  Result Value Ref Range Status   SARS Coronavirus 2 NEGATIVE NEGATIVE Final    Comment: (NOTE) SARS-CoV-2 target nucleic acids are NOT DETECTED. The SARS-CoV-2 RNA is generally detectable in upper and lower respiratory specimens during the acute phase of infection. The  lowest concentration of SARS-CoV-2 viral copies this assay can detect is 250 copies / mL. A negative result does not preclude SARS-CoV-2 infection and should not be used as the sole basis for treatment or other patient management decisions.    A negative result may occur with improper specimen collection / handling, submission of specimen other than nasopharyngeal swab, presence of viral mutation(s) within the areas targeted by this assay, and inadequate number of viral copies (<250 copies / mL). A negative result must be combined with clinical observations, patient history, and epidemiological information. Fact Sheet for Patients:   StrictlyIdeas.no Fact Sheet for Healthcare Providers: BankingDealers.co.za This test is not yet approved or cleared  by the [place] FDA and has been authorized for detection and/or diagnosis of SARS-CoV-2 by FDA under an Emergency Use Authorization (EUA).  This EUA will remain in effect (meaning this test can be used) for the duration of the COVID-19 declaration under Section 564(b)(1) of the Act, 21 U.S.C. section 360bbb-3(b)(1), unless the authorization is terminated or revoked sooner. Performed at [facility], [address] [place], [place] [address]         Oswald Hillock   [facility] If 7PM-7AM, please contact night-coverage at [email or web address], Office  [phone]   12/16/2019, 12:00 PM  LOS: 2 days

## 2019-12-17 ENCOUNTER — Ambulatory Visit: Payer: Self-pay | Admitting: Infectious Disease

## 2019-12-17 LAB — CBC
HCT: 32.1 % — ABNORMAL LOW (ref 36.0–46.0)
Hemoglobin: 10.4 g/dL — ABNORMAL LOW (ref 12.0–15.0)
MCH: 27.4 pg (ref 26.0–34.0)
MCHC: 32.4 g/dL (ref 30.0–36.0)
MCV: 84.5 fL (ref 80.0–100.0)
Platelets: 258 10*3/uL (ref 150–400)
RBC: 3.8 MIL/uL — ABNORMAL LOW (ref 3.87–5.11)
RDW: 20.6 % — ABNORMAL HIGH (ref 11.5–15.5)
WBC: 4.7 10*3/uL (ref 4.0–10.5)
nRBC: 0 % (ref 0.0–0.2)

## 2019-12-17 LAB — COMPREHENSIVE METABOLIC PANEL
ALT: 44 U/L (ref 0–44)
AST: 60 U/L — ABNORMAL HIGH (ref 15–41)
Albumin: 2.1 g/dL — ABNORMAL LOW (ref 3.5–5.0)
Alkaline Phosphatase: 190 U/L — ABNORMAL HIGH (ref 38–126)
Anion gap: 10 (ref 5–15)
BUN: 9 mg/dL (ref 6–20)
CO2: 27 mmol/L (ref 22–32)
Calcium: 7.5 mg/dL — ABNORMAL LOW (ref 8.9–10.3)
Chloride: 96 mmol/L — ABNORMAL LOW (ref 98–111)
Creatinine, Ser: 0.49 mg/dL (ref 0.44–1.00)
GFR calc Af Amer: >60 mL/min (ref >60)
GFR calc non Af Amer: >60 mL/min (ref >60)
Glucose, Bld: 84 mg/dL (ref 70–99)
Potassium: 3.2 mmol/L — ABNORMAL LOW (ref 3.5–5.1)
Sodium: 133 mmol/L — ABNORMAL LOW (ref 135–145)
Total Bilirubin: 1.4 mg/dL — ABNORMAL HIGH (ref 0.3–1.2)
Total Protein: 5.6 g/dL — ABNORMAL LOW (ref 6.5–8.1)

## 2019-12-17 LAB — HIV GENOSURE(R) MG

## 2019-12-17 LAB — REFLEX TO GENOSURE(R) MG EDI: HIV GenoSure(R): 1

## 2019-12-17 LAB — HIV-1 RNA, PCR (GRAPH) RFX/GENO EDI
HIV-1 RNA BY PCR: 1810 copies/mL
HIV-1 RNA Quant, Log: 3.258 log10copy/mL

## 2019-12-17 LAB — POTASSIUM: Potassium: 4 mmol/L (ref 3.5–5.1)

## 2019-12-17 MED ORDER — HEPARIN SOD (PORK) LOCK FLUSH 100 UNIT/ML IV SOLN
500.0000 [IU] | INTRAVENOUS | Status: AC | PRN
  Administered 2019-12-17: 500 [IU]
  Filled 2019-12-17: qty 5

## 2019-12-17 MED ORDER — FOLIC ACID 1 MG PO TABS: 2.0000 mg | ORAL_TABLET | Freq: Every day | ORAL | 1 refills | Status: DC

## 2019-12-17 MED ORDER — PANTOPRAZOLE SODIUM 40 MG PO TBEC: 40.0000 mg | DELAYED_RELEASE_TABLET | Freq: Two times a day (BID) | ORAL | 1 refills | Status: DC

## 2019-12-17 MED ORDER — FOLIC ACID 1 MG PO TABS
2.0000 mg | ORAL_TABLET | Freq: Every day | ORAL | Status: DC
  Administered 2019-12-17: 2 mg via ORAL
  Filled 2019-12-17: qty 2

## 2019-12-17 MED ORDER — SODIUM CHLORIDE 0.9 % IV SOLN
510.0000 mg | Freq: Once | INTRAVENOUS | Status: AC
  Administered 2019-12-17: 510 mg via INTRAVENOUS
  Filled 2019-12-17: qty 510

## 2019-12-17 MED ORDER — POTASSIUM CHLORIDE CRYS ER 20 MEQ PO TBCR
40.0000 meq | EXTENDED_RELEASE_TABLET | ORAL | Status: AC
  Administered 2019-12-17 (×2): 40 meq via ORAL
  Filled 2019-12-17 (×2): qty 2

## 2019-12-17 NOTE — Discharge Summary (Addendum)
Physician Discharge Summary  Anita Hoffman:[id number] DOB: [birth year] DOA: 12/13/2019  PCP: Patient, No Pcp Per  Admit date: 12/13/2019 Discharge date: 12/17/2019  Time spent: 50 minutes  Recommendations for Outpatient Follow-up:  1. Follow-up Dr. Marin Olp in 1 week  Discharge Diagnoses:  Principal Problem:   Rectal bleeding Active Problems:   Bilateral pulmonary embolism (HCC)   Metastatic malignant neoplasm (HCC)   HIV disease ([place])   Bright red rectal bleeding   Discharge Condition: Stable  Diet recommendation: Regular diet  Filed Weights   12/13/19 1656  Weight: 75.5 kg    History of present illness:  59 year old female with a history of recent diagnosis of metastatic adenocarcinoma cecum with liver metastasis, pulmonary embolism left gastrocnemius vein thrombosis she was discharged a week ago on Lovenox.  Had a chemo infusion done on 12/12/2019 and came to ED with complaints of rectal bleeding.  GI was consulted and recommended observation.   Hospital Course:  1. *Hematochezia-resented with bright red blood per rectum in setting of cecal adenocarcinoma.  In the setting of anticoagulation for bilateral PE.  GI was consulted and advised monitoring for observation.  Dr. Marin Olp has seen and is concerned that she may be bleeding from the tumor and she may need resection of the tumor if she continues to bleed.  Patient had no more bleeding over the weekend.  Will likely discharge home today. 2. Hypokalemia-replete.  Repeat potassium is 4.0. 3. Anemia-secondary to ABL, 2 units PRBC ordered by Dr. Marin Olp.  Hemoglobin has improved to 10.5. 4. Recent bilateral pulmonary embolism/left gastrocnemius DVT-patient was discharged on Lovenox 80 mg twice daily.  Will continue with Lovenox 80 mg subcu twice a day. 5. 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 Marin Olp as outpatient.  She is on FOLFOX.   6. GERD-continue Protonix 40 mg p.o. twice  daily. 7. Thrombocytopenia-likely in setting of chemotherapy. 8. Iron deficiency anemia-patient will receive 1 dose of Feraheme before discharge.    Will also discharged on folic acid 2 mg p.o. daily as per Dr. Marin Olp recommendation. 9. His HIV-resumed HIV medication Biktarvy 10. Transaminitis-patient has elevated LFTs with alk phos 277, AST 295, ALT 77.   LFTs have improved today AST is 75, ALT 60.  Alk phos is 197.  Likely from liver mets  Procedures:    Consultations:  Oncology  Discharge Exam: Vitals:   12/17/19 0506 12/17/19 1359  BP: 109/71 112/80  Pulse: 92 80  Resp: 16 19  Temp: 99.6 F (37.6 C) 97.8 F (36.6 C)  SpO2: 97% 100%    Discharge Instructions   Discharge Instructions    Diet - low sodium heart healthy   Complete by: As directed    Increase activity slowly   Complete by: As directed      Allergies as of 12/17/2019   No Known Allergies     Medication List    TAKE these medications   Biktarvy 50-200-25 MG Tabs tablet Generic drug: bictegravir-emtricitabine-tenofovir AF Take 1 tablet by mouth daily.   dexamethasone 4 MG tablet Commonly known as: DECADRON Take 2 tablets (8 mg total) by mouth daily. Start the day after chemotherapy for 3 days. Take with food.   enoxaparin 80 MG/0.8ML injection Commonly known as: LOVENOX Inject 0.8 mLs (80 mg total) into the skin 2 (two) times daily.   folic acid 1 MG tablet Commonly known as: FOLVITE Take 2 tablets (2 mg total) by mouth daily. Start taking on: Dec 18, 2019   lidocaine-prilocaine  cream Commonly known as: EMLA Apply to affected area once What changed:   how much to take  how to take this  when to take this  reasons to take this   loperamide 2 MG tablet Commonly known as: Imodium A-D Take 2 at onset of diarrhea, then 1 every 2hrs until 12hr without a BM. May take 2 tab every 4hrs at bedtime. If diarrhea recurs repeat. What changed:   how much to take  how to take this  when  to take this  reasons to take this   LORazepam 0.5 MG tablet Commonly known as: Ativan Take 1 tablet (0.5 mg total) by mouth every 6 (six) hours as needed for anxiety.   ondansetron 8 MG tablet Commonly known as: Zofran Take 1 tablet (8 mg total) by mouth 2 (two) times daily as needed. Start on day 3 after chemotherapy.   pantoprazole 40 MG tablet Commonly known as: PROTONIX Take 1 tablet (40 mg total) by mouth 2 (two) times daily.   prochlorperazine 10 MG tablet Commonly known as: COMPAZINE Take 1 tablet (10 mg total) by mouth every 6 (six) hours as needed (Nausea or vomiting).      No Known Allergies    The results of significant diagnostics from this hospitalization (including imaging, microbiology, ancillary and laboratory) are listed below for reference.    Significant Diagnostic Studies: DG Chest 2 View  Result Date: 11/28/2019 CLINICAL DATA:  59 year old female with history of chest and abdominal pain for the past 2 weeks. EXAM: CHEST - 2 VIEW COMPARISON:  No priors. FINDINGS: Lung volumes are normal. No consolidative airspace disease. No pleural effusions. No pneumothorax. No pulmonary nodule or mass noted. Pulmonary vasculature and the cardiomediastinal silhouette are within normal limits. IMPRESSION: No radiographic evidence of acute cardiopulmonary disease. Electronically Signed   By: Vinnie Langton M.D.   On: 11/28/2019 13:46   CT Angio Chest PE W/Cm &/Or Wo Cm  Result Date: 11/29/2019 CLINICAL DATA:  RIGHT upper quadrant and RIGHT lower chest pain, shortness of breath, lower extremity edema, former smoker EXAM: CT ANGIOGRAPHY CHEST CT ABDOMEN AND PELVIS WITH CONTRAST TECHNIQUE: Multidetector CT imaging of the chest was performed using the standard protocol during bolus administration of intravenous contrast. Multiplanar CT image reconstructions and MIPs were obtained to evaluate the vascular anatomy. Multidetector CT imaging of the abdomen and pelvis was performed  using the standard protocol during bolus administration of intravenous contrast. CONTRAST:  190m OMNIPAQUE IOHEXOL 300 MG/ML SOLN IV. No oral contrast. COMPARISON:  CT abdomen and pelvis 11/22/2006 FINDINGS: CTA CHEST FINDINGS Cardiovascular: Aorta normal caliber without aneurysm or dissection. Pulmonary arteries well opacified. Filling defects are seen within the pulmonary arteries bilaterally RIGHT greater than LEFT consistent with pulmonary emboli. These are greatest in the RIGHT lower lobe but affect all remaining lobes as well. RV/LV ratio = 1.0, elevated, consistent with RIGHT heart strain. Mediastinum/Nodes: Base of cervical region normal appearance. Normal sized axillary nodes bilaterally. No thoracic adenopathy. Esophagus unremarkable. Lungs/Pleura: Linear subsegmental atelectasis LEFT lower lobe. Lungs otherwise clear. No pulmonary infiltrate, pleural effusion, or pneumothorax. Musculoskeletal: No acute osseous findings. Review of the MIP images confirms the above findings. CT ABDOMEN and PELVIS FINDINGS Hepatobiliary: Multiple poorly defined mass lesions are seen throughout the liver consistent with extensive hepatic metastatic disease. Largest of these measure 6.6 x 4.2 cm lateral segment LEFT lobe image 43, 5.7 x 5.7 cm posterior RIGHT lobe image 34, anterior RIGHT lobe 7.2 x 5.0 cm image 33, and 7.6 x  7.1 cm anterior RIGHT lobe image 22. Gallbladder unremarkable. Pancreas: Normal appearance Spleen: Normal appearance Adrenals/Urinary Tract: Adrenal glands normal appearance. BILATERAL renal cysts. Kidneys, ureters, and bladder otherwise normal appearance. Stomach/Bowel: Normal appendix. Large mass identified at cecum extending into ileocecal valve region and terminal ileum measuring 5.6 x 4.0 x 4.5 cm consistent with colonic neoplasm. Thickening of terminal ileum without obstruction. Stomach decompressed. Remaining large and small bowel loops unremarkable. Vascular/Lymphatic: Minimal atherosclerotic  calcification aorta. Aorta normal caliber. Few pelvic phleboliths. Mesenteric mass with minimal calcification 4.1 x 2.8 cm image 54 likely nodal metastasis. Additional enlarged pathologic node mesentery medial to cecal tip 1.7 x 1.5 cm. Normal sized inguinal lymph nodes. Reproductive: Uterus and adnexa unremarkable Other: Tiny umbilical hernia containing fat. No free air or free fluid. Musculoskeletal: Bones demineralized. Lipoma involving the distal RIGHT iliopsoas muscle/tendon, 3.6 x 2.5 cm x 8.9 cm length. Review of the MIP images confirms the above findings. IMPRESSION: Multiple BILATERAL pulmonary emboli RIGHT greater than LEFT. Positive for acute PE with CT evidence of right heart strain (RV/LV Ratio = 1.0) consistent with at least submassive (intermediate risk) PE; the presence of right heart strain has been associated with an increased risk of morbidity and mortality. Large cecal mass extending into ileocecal valve region and terminal ileum measuring 5.6 x 4.0 x 4.5 cm consistent with colonic neoplasm. Multiple hepatic metastases. Mesenteric nodal metastases. Aortic Atherosclerosis (ICD10-I70.0). Critical Value/emergent results were called by telephone at the time of interpretation on 11/29/2019 at 12:24 pm to provider DR. ELIZABETH REES , who verbally acknowledged these results. Electronically Signed   By: Lavonia Dana M.D.   On: 11/29/2019 12:27   CT Abdomen Pelvis W Contrast  Result Date: 11/29/2019 CLINICAL DATA:  RIGHT upper quadrant and RIGHT lower chest pain, shortness of breath, lower extremity edema, former smoker EXAM: CT ANGIOGRAPHY CHEST CT ABDOMEN AND PELVIS WITH CONTRAST TECHNIQUE: Multidetector CT imaging of the chest was performed using the standard protocol during bolus administration of intravenous contrast. Multiplanar CT image reconstructions and MIPs were obtained to evaluate the vascular anatomy. Multidetector CT imaging of the abdomen and pelvis was performed using the standard  protocol during bolus administration of intravenous contrast. CONTRAST:  17m OMNIPAQUE IOHEXOL 300 MG/ML SOLN IV. No oral contrast. COMPARISON:  CT abdomen and pelvis 11/22/2006 FINDINGS: CTA CHEST FINDINGS Cardiovascular: Aorta normal caliber without aneurysm or dissection. Pulmonary arteries well opacified. Filling defects are seen within the pulmonary arteries bilaterally RIGHT greater than LEFT consistent with pulmonary emboli. These are greatest in the RIGHT lower lobe but affect all remaining lobes as well. RV/LV ratio = 1.0, elevated, consistent with RIGHT heart strain. Mediastinum/Nodes: Base of cervical region normal appearance. Normal sized axillary nodes bilaterally. No thoracic adenopathy. Esophagus unremarkable. Lungs/Pleura: Linear subsegmental atelectasis LEFT lower lobe. Lungs otherwise clear. No pulmonary infiltrate, pleural effusion, or pneumothorax. Musculoskeletal: No acute osseous findings. Review of the MIP images confirms the above findings. CT ABDOMEN and PELVIS FINDINGS Hepatobiliary: Multiple poorly defined mass lesions are seen throughout the liver consistent with extensive hepatic metastatic disease. Largest of these measure 6.6 x 4.2 cm lateral segment LEFT lobe image 43, 5.7 x 5.7 cm posterior RIGHT lobe image 34, anterior RIGHT lobe 7.2 x 5.0 cm image 33, and 7.6 x 7.1 cm anterior RIGHT lobe image 22. Gallbladder unremarkable. Pancreas: Normal appearance Spleen: Normal appearance Adrenals/Urinary Tract: Adrenal glands normal appearance. BILATERAL renal cysts. Kidneys, ureters, and bladder otherwise normal appearance. Stomach/Bowel: Normal appendix. Large mass identified at cecum extending into ileocecal  valve region and terminal ileum measuring 5.6 x 4.0 x 4.5 cm consistent with colonic neoplasm. Thickening of terminal ileum without obstruction. Stomach decompressed. Remaining large and small bowel loops unremarkable. Vascular/Lymphatic: Minimal atherosclerotic calcification aorta.  Aorta normal caliber. Few pelvic phleboliths. Mesenteric mass with minimal calcification 4.1 x 2.8 cm image 54 likely nodal metastasis. Additional enlarged pathologic node mesentery medial to cecal tip 1.7 x 1.5 cm. Normal sized inguinal lymph nodes. Reproductive: Uterus and adnexa unremarkable Other: Tiny umbilical hernia containing fat. No free air or free fluid. Musculoskeletal: Bones demineralized. Lipoma involving the distal RIGHT iliopsoas muscle/tendon, 3.6 x 2.5 cm x 8.9 cm length. Review of the MIP images confirms the above findings. IMPRESSION: Multiple BILATERAL pulmonary emboli RIGHT greater than LEFT. Positive for acute PE with CT evidence of right heart strain (RV/LV Ratio = 1.0) consistent with at least submassive (intermediate risk) PE; the presence of right heart strain has been associated with an increased risk of morbidity and mortality. Large cecal mass extending into ileocecal valve region and terminal ileum measuring 5.6 x 4.0 x 4.5 cm consistent with colonic neoplasm. Multiple hepatic metastases. Mesenteric nodal metastases. Aortic Atherosclerosis (ICD10-I70.0). Critical Value/emergent results were called by telephone at the time of interpretation on 11/29/2019 at 12:24 pm to provider DR. ELIZABETH REES , who verbally acknowledged these results. Electronically Signed   By: Lavonia Dana M.D.   On: 11/29/2019 12:27   ECHOCARDIOGRAM COMPLETE  Result Date: 11/29/2019    ECHOCARDIOGRAM REPORT   Patient Name:   Kristie Cowman Date of Exam: 11/29/2019 Medical Rec #:  [id number]         Height:       62.0 in Accession #:    [id number]        Weight:       210.0 lb Date of Birth:  03-May-1961         BSA:          1.952 m Patient Age:    38 years          BP:           133/77 mmHg Patient Gender: F                 HR:           97 bpm. Exam Location:  Inpatient Procedure: 2D Echo Indications:    Pulmonary Embolus I26.99  History:        Patient has no prior history of Echocardiogram examinations.                  Risk Factors:Former Smoker.  Sonographer:    Mikki Santee RDCS (AE) Referring Phys: [id number] Michell Heinrich PAHWANI IMPRESSIONS  1. Left ventricular ejection fraction, by estimation, is 60 to 65%. The left ventricle has normal function. The left ventricle has no regional wall motion abnormalities. There is mild concentric left ventricular hypertrophy. Left ventricular diastolic parameters are consistent with Grade I diastolic dysfunction (impaired relaxation).  2. Right ventricular systolic function is normal. The right ventricular size is normal. There is normal pulmonary artery systolic pressure.  3. The mitral valve is normal in structure. No evidence of mitral valve regurgitation. No evidence of mitral stenosis.  4. The aortic valve is normal in structure. Aortic valve regurgitation is not visualized. No aortic stenosis is present.  5. The inferior vena cava is normal in size with greater than 50% respiratory variability, suggesting right atrial pressure of 3 mmHg. FINDINGS  Left Ventricle: Left  ventricular ejection fraction, by estimation, is 60 to 65%. The left ventricle has normal function. The left ventricle has no regional wall motion abnormalities. The left ventricular internal cavity size was normal in size. There is  mild concentric left ventricular hypertrophy. Left ventricular diastolic parameters are consistent with Grade I diastolic dysfunction (impaired relaxation). Right Ventricle: The right ventricular size is normal. No increase in right ventricular wall thickness. Right ventricular systolic function is normal. There is normal pulmonary artery systolic pressure. The tricuspid regurgitant velocity is 2.07 m/s, and  with an assumed right atrial pressure of 3 mmHg, the estimated right ventricular systolic pressure is 35.4 mmHg. Left Atrium: Left atrial size was normal in size. Right Atrium: Right atrial size was normal in size. Pericardium: Trivial pericardial effusion is present. Mitral  Valve: The mitral valve is normal in structure. Normal mobility of the mitral valve leaflets. No evidence of mitral valve regurgitation. No evidence of mitral valve stenosis. Tricuspid Valve: The tricuspid valve is normal in structure. Tricuspid valve regurgitation is trivial. No evidence of tricuspid stenosis. Aortic Valve: The aortic valve is normal in structure. Aortic valve regurgitation is not visualized. No aortic stenosis is present. Pulmonic Valve: The pulmonic valve was normal in structure. Pulmonic valve regurgitation is not visualized. No evidence of pulmonic stenosis. Aorta: The aortic root is normal in size and structure. Venous: The inferior vena cava is normal in size with greater than 50% respiratory variability, suggesting right atrial pressure of 3 mmHg. IAS/Shunts: No atrial level shunt detected by color flow Doppler.  LEFT VENTRICLE PLAX 2D LVIDd:         4.07 cm  Diastology LVIDs:         2.36 cm  LV e' lateral:   9.46 cm/s LV PW:         1.28 cm  LV E/e' lateral: 7.3 LV IVS:        1.01 cm  LV e' medial:    8.16 cm/s LVOT diam:     2.00 cm  LV E/e' medial:  8.4 LV SV:         55 LV SV Index:   28 LVOT Area:     3.14 cm  RIGHT VENTRICLE RV S prime:     18.60 cm/s TAPSE (M-mode): 1.4 cm LEFT ATRIUM             Index       RIGHT ATRIUM           Index LA diam:        3.20 cm 1.64 cm/m  RA Area:     10.60 cm LA Vol (A2C):   21.4 ml 10.96 ml/m RA Volume:   19.60 ml  10.04 ml/m LA Vol (A4C):   37.3 ml 19.11 ml/m LA Biplane Vol: 31.1 ml 15.93 ml/m  AORTIC VALVE LVOT Vmax:   117.00 cm/s LVOT Vmean:  67.500 cm/s LVOT VTI:    0.176 m  AORTA Ao Root diam: 2.60 cm MITRAL VALVE               TRICUSPID VALVE MV Area (PHT): 5.42 cm    TR Peak grad:   17.1 mmHg MV Decel Time: 140 msec    TR Vmax:        207.00 cm/s MV E velocity: 68.90 cm/s MV A velocity: 82.00 cm/s  SHUNTS MV E/A ratio:  0.84        Systemic VTI:  0.18 m  Systemic Diam: 2.00 cm Skeet Latch MD  Electronically signed by Skeet Latch MD Signature Date/Time: 11/29/2019/6:13:07 PM    Final    IR IMAGING GUIDED PORT INSERTION  Result Date: 12/07/2019 INDICATION: 89-year-old female with recently diagnosed adenocarcinoma. She requires durable IV access for chemotherapy. EXAM: IMPLANTED PORT A CATH PLACEMENT WITH ULTRASOUND AND FLUOROSCOPIC GUIDANCE MEDICATIONS: 2 g Ancef; The antibiotic was administered within an appropriate time interval prior to skin puncture. ANESTHESIA/SEDATION: Versed 2 mg IV; Fentanyl 100 mcg IV; Moderate Sedation Time:  20 minutes The patient was continuously monitored during the procedure by the interventional radiology nurse under my direct supervision. FLUOROSCOPY TIME:  0 minutes, 24 seconds (3 mGy) COMPLICATIONS: None immediate. PROCEDURE: The right neck and chest was prepped with chlorhexidine, and draped in the usual sterile fashion using maximum barrier technique (cap and mask, sterile gown, sterile gloves, large sterile sheet, hand hygiene and cutaneous antiseptic). Local anesthesia was attained by infiltration with 1% lidocaine with epinephrine. Ultrasound demonstrated patency of the right internal jugular vein, and this was documented with an image. Under real-time ultrasound guidance, this vein was accessed with a 21 gauge micropuncture needle and image documentation was performed. A small dermatotomy was made at the access site with an 11 scalpel. A 0.018" wire was advanced into the SVC and the access needle exchanged for a 86F micropuncture vascular sheath. The 0.018" wire was then removed and a 0.035" wire advanced into the IVC. An appropriate location for the subcutaneous reservoir was selected below the clavicle and an incision was made through the skin and underlying soft tissues. The subcutaneous tissues were then dissected using a combination of blunt and sharp surgical technique and a pocket was formed. A single lumen power injectable portacatheter was then  tunneled through the subcutaneous tissues from the pocket to the dermatotomy and the port reservoir placed within the subcutaneous pocket. The venous access site was then serially dilated and a peel away vascular sheath placed over the wire. The wire was removed and the port catheter advanced into position under fluoroscopic guidance. The catheter tip is positioned in the superior cavoatrial junction. This was documented with a spot image. The portacatheter was then tested and found to flush and aspirate well. The port was flushed with saline followed by 100 units/mL heparinized saline. The pocket was then closed in two layers using first subdermal inverted interrupted absorbable sutures followed by a running subcuticular suture. The epidermis was then sealed with Dermabond. The dermatotomy at the venous access site was also closed with Dermabond. IMPRESSION: Successful placement of a right IJ approach Power Port with ultrasound and fluoroscopic guidance. The catheter is ready for use. Electronically Signed   By: Jacqulynn Cadet M.D.   On: 12/07/2019 18:39   VAS [place] LOWER EXTREMITY VENOUS (DVT)  Result Date: 11/29/2019  Lower Venous DVTStudy Indications: Pulmonary embolism.  Comparison Study: no prior Performing Technologist: Abram Sander RVS  Examination Guidelines: A complete evaluation includes B-mode imaging, spectral Doppler, color Doppler, and power Doppler as needed of all accessible portions of each vessel. Bilateral testing is considered an integral part of a complete examination. Limited examinations for reoccurring indications may be performed as noted. The reflux portion of the exam is performed with the patient in reverse Trendelenburg.  +---------+---------------+---------+-----------+----------+--------------+ RIGHT    CompressibilityPhasicitySpontaneityPropertiesThrombus Aging +---------+---------------+---------+-----------+----------+--------------+ CFV      Full           Yes       Yes                                 +---------+---------------+---------+-----------+----------+--------------+  SFJ      Full                                                        +---------+---------------+---------+-----------+----------+--------------+ FV Prox  Full                                                        +---------+---------------+---------+-----------+----------+--------------+ FV Mid   Full                                                        +---------+---------------+---------+-----------+----------+--------------+ FV DistalFull                                                        +---------+---------------+---------+-----------+----------+--------------+ PFV      Full                                                        +---------+---------------+---------+-----------+----------+--------------+ POP      Full           Yes      Yes                                 +---------+---------------+---------+-----------+----------+--------------+ PTV      Full                                                        +---------+---------------+---------+-----------+----------+--------------+ PERO     Full                                                        +---------+---------------+---------+-----------+----------+--------------+   +---------+---------------+---------+-----------+----------+--------------+ LEFT     CompressibilityPhasicitySpontaneityPropertiesThrombus Aging +---------+---------------+---------+-----------+----------+--------------+ CFV      Full           Yes      Yes                                 +---------+---------------+---------+-----------+----------+--------------+ SFJ      Full                                                        +---------+---------------+---------+-----------+----------+--------------+  FV Prox  Full                                                         +---------+---------------+---------+-----------+----------+--------------+ FV Mid   Full                                                        +---------+---------------+---------+-----------+----------+--------------+ FV DistalFull                                                        +---------+---------------+---------+-----------+----------+--------------+ PFV      Full                                                        +---------+---------------+---------+-----------+----------+--------------+ POP      Full           Yes      Yes                                 +---------+---------------+---------+-----------+----------+--------------+ PTV      Full                                                        +---------+---------------+---------+-----------+----------+--------------+ PERO                                                  Not visualized +---------+---------------+---------+-----------+----------+--------------+ [facility]  None                                         Acute          +---------+---------------+---------+-----------+----------+--------------+     Summary: RIGHT: - There is no evidence of deep vein thrombosis in the lower extremity.  - No cystic structure found in the popliteal fossa.  LEFT: - Findings consistent with acute deep vein thrombosis involving the left gastrocnemius veins. - No cystic structure found in the popliteal fossa.  *See table(s) above for measurements and observations. Electronically signed by Curt Jews MD on 11/29/2019 at 4:25:09 PM.    Final    US Abdomen Limited RUQ  Result Date: 11/29/2019 CLINICAL DATA:  Right upper quadrant pain 3 months. EXAM: ULTRASOUND ABDOMEN LIMITED RIGHT UPPER QUADRANT COMPARISON:  05/08/2007 FINDINGS: Gallbladder: Significant cholelithiasis with largest stone measuring 1.1 cm. Gallbladder wall thickening measuring 5.5 mm. Negative sonographic Murphy sign. No adjacent free fluid.  Common bile duct: Diameter: 5.7 mm. Liver: Heterogeneous parenchymal echogenicity with numerous liver masses many of which have a more hyperechoic periphery with more hypoechoic appearance centrally suggesting a somewhat target appearance. There is a large irregular shape cystic lesion with echogenic periphery over the right lobe measuring approximately 7.3 cm. Portal vein is patent on color Doppler imaging with normal direction of blood flow towards the liver. Other: None. IMPRESSION: 1. Moderate to severe cholelithiasis with mild gallbladder wall thickening of 5.5 mm. Recommend clinical correlation as findings could be seen with acute cholecystitis. 2. Numerous liver masses with the largest over the right lobe measuring 7.3 cm containing central cystic/necrotic area. Findings are highly suspicious for metastatic disease. Recommend clinical correlation as further evaluation with chest/abdominal/pelvic CT may be helpful to search for primary malignancy. Electronically Signed   By: Marin Olp M.D.   On: 11/29/2019 09:11    Microbiology: Recent Results (from the past 240 hour(s))  SARS Coronavirus 2 by RT PCR (hospital order, performed in [facility] hospital lab) Nasopharyngeal Nasopharyngeal Swab     Status: None   Collection Time: 12/13/19 12:11 PM   Specimen: Nasopharyngeal Swab  Result Value Ref Range Status   SARS Coronavirus 2 NEGATIVE NEGATIVE Final    Comment: (NOTE) SARS-CoV-2 target nucleic acids are NOT DETECTED. The SARS-CoV-2 RNA is generally detectable in upper and lower respiratory specimens during the acute phase of infection. The lowest concentration of SARS-CoV-2 viral copies this assay can detect is 250 copies / mL. A negative result does not preclude SARS-CoV-2 infection and should not be used as the sole basis for treatment or other patient management decisions.  A negative result may occur with improper specimen collection / handling, submission of specimen other than  nasopharyngeal swab, presence of viral mutation(s) within the areas targeted by this assay, and inadequate number of viral copies (<250 copies / mL). A negative result must be combined with clinical observations, patient history, and epidemiological information. Fact Sheet for Patients:   StrictlyIdeas.no Fact Sheet for Healthcare Providers: BankingDealers.co.za This test is not yet approved or cleared  by the [place] FDA and has been authorized for detection and/or diagnosis of SARS-CoV-2 by FDA under an Emergency Use Authorization (EUA).  This EUA will remain in effect (meaning this test can be used) for the duration of the COVID-19 declaration under Section 564(b)(1) of the Act, 21 U.S.C. section 360bbb-3(b)(1), unless the authorization is terminated or revoked sooner. Performed at [facility], [place] [address] [place], [place] [address]      Labs: Basic Metabolic Panel: Recent Labs  Lab 12/12/19 0900 12/12/19 0900 12/13/19 1043 12/14/19 0441 12/16/19 0558 12/17/19 0445 12/17/19 1450  NA 134*  --  133* 136 136 133*  --   K 3.4*   < > 3.5 3.6 3.1* 3.2* 4.0  CL 97*  --  98 101 99 96*  --   CO2 26  --  _0 --   GLUCOSE 105*  --  148* 121* 100* 84  --   BUN 7  --  _1 --   CREATININE 0.54  --  0.68 0.59 0.44 0.49  --   CALCIUM 8.4*  --  8.1* 8.0* 7.6* 7.5*  --    < > = values in this interval not displayed.   Liver Function Tests: Recent Labs  Lab 12/12/19 0900 12/13/19 1043 12/14/19 0441 12/16/19 0558 12/17/19 0445  AST 63* 59* 295* 75* 60*  ALT 23 28 77* 60* 44  ALKPHOS 254* 265* 277* 197* 190*  BILITOT 0.7 0.9 1.1 1.3* 1.4*  PROT 6.8 7.4 6.8 6.1* 5.6*  ALBUMIN 3.1* 2.6* 2.4* 2.2* 2.1*   No results for input(s): LIPASE, AMYLASE in the last 168 hours. No results for input(s): AMMONIA in the last 168 hours. CBC: Recent Labs  Lab 12/12/19 0900 12/12/19 0900  12/13/19 1043 12/13/19 1401 12/14/19 0441 12/14/19 1410 12/15/19 0441 12/15/19 0441 12/15/19 0758 12/15/19 2214 12/16/19 0558 12/16/19 1500 12/17/19 0445  WBC 10.9*  --  15.2*  --  12.7*  --  10.3  --   --   --  8.0  --  4.7  NEUTROABS 8.6*  --  13.5*  --   --   --   --   --   --   --   --   --   --   HGB 9.2*  --  10.0*   < > 9.9*   < > 8.7*   < > 8.5* 10.9* 10.5*  10.4* 11.3* 10.4*  HCT 29.5*   < > 32.7*   < > 32.5*   < > 28.5*   < > 27.5* 34.5* 32.9*  32.7* 35.4* 32.1*  MCV 81.0   < > 83.4  --  84.0  --  84.1  --   --   --  83.9  --  84.5  PLT 491*  --  616*  --  574*  --  452*  --   --   --  327  --  258   < > = values in this interval not displayed.   Cardiac Enzymes: No results for input(s): CKTOTAL, CKMB, CKMBINDEX, TROPONINI in the last 168 hours. BNP: BNP (last 3 results) No results for input(s): BNP in the last 8760 hours.  ProBNP (last 3 results) No results for input(s): PROBNP in the last 8760 hours.  CBG: No results for input(s): GLUCAP in the last 168 hours.     Signed:  Oswald Hillock MD.  [facility] 12/17/2019, 3:35 PM

## 2019-12-17 NOTE — Progress Notes (Signed)
D/C instructions given to patient. Patient had no questions. NT or writer will wheel patient out once her family is here to pick her up  

## 2019-12-17 NOTE — Progress Notes (Signed)
Anita Hoffman is doing well.  She got her blood transfusion on Saturday.  She said there is no more bleeding.  She is back on Lovenox injections.  I will give her another dose of IV iron today just to make sure that her iron levels are restored.  I am also going to put her on folic acid at 2 mg daily.  She is eating.  She is on a regular diet.  She is having no nausea or vomiting.  There is no cough or shortness of breath.  She has had no leg swelling.  There is no fever.  She has had no mouth sores.  Her labs show white cell count of 4.7.  Hemoglobin 10.4.  Platelet count 258,000.  Her BUN is 9 creatinine 0.49.  Calcium 7.5.  Albumin 2.1.  Her bilirubin is 1.4.  Her potassium is 3.2.  On her physical exam, her vital signs are temperature 99.6.  Pulse 92.  Blood pressure 109/71.  Her oral exam shows no mucositis.  There is no thrush.  Lungs are clear.  She has good air movement bilaterally.  Cardiac exam regular rate and rhythm with no murmurs, rubs or bruits.  Abdomen is soft.  There is some slight tenderness in the right upper quadrant.  She has decent bowel sounds.  There is no fluid wave.  There is no splenomegaly.  Liver edge is at the right costal margin.  Extremities shows no clubbing, cyanosis or edema.  She has good range of motion of her joints.  Skin exam shows no rashes, ecchymoses or petechia.  Neurological exam is nonfocal.  Anita Hoffman had the episode of hematochezia.  I am sure this is from the cecal tumor.  She seems to have had a isolated episode.  She is looking good.  I would like to believe that she can go home either today or tomorrow at the latest.  I do not have any problems with her going home today.  Everything looks stable.  I would make sure she gets the folic acid at home.  I would also make sure she is on PPI at home.  I appreciate the outstanding care that she got from everybody on [address]  I appreciate the compassion and the professionalism of the staff.  Lattie Haw, MD  Lurena Joiner 1:37

## 2019-12-17 NOTE — Progress Notes (Signed)
[facility]  PROGRESS NOTE  [name] SWF:[id number] DOB: January 08, 1961 DOA: 12/13/2019 PCP: Anita Hoffman, No Pcp Per   Brief HPI:   59 year old female with a history of recent diagnosis of metastatic adenocarcinoma cecum with liver metastasis, pulmonary embolism left gastrocnemius vein thrombosis she was discharged a week ago on Lovenox.  Had a chemo infusion done on 12/12/2019 and came to ED with complaints of rectal bleeding.  GI was consulted and recommended observation.    Subjective   Anita Hoffman seen and examined, denies abdominal pain.  No shortness of breath.  Oncology has ordered IV iron.  Potassium is 3.2 this morning.   Assessment/Plan:     1. Hematochezia-resented with bright red blood per rectum in setting of cecal adenocarcinoma.  In the setting of anticoagulation for bilateral PE.  GI was consulted and advised monitoring for observation.  Dr. Marin Olp has seen and is concerned that she may be bleeding from the tumor and she may need resection of the tumor if she continues to bleed.  Anita Hoffman had no more bleeding over the weekend.  Will likely discharge home today. 2. Hypokalemia-we will replace potassium with K. Dur 40 mEq p.o. every 4 hours x2.  Recheck potassium at 2 PM. 3. Anemia-secondary to ABL, 2 units PRBC ordered by Dr. Marin Olp.  Hemoglobin has improved to 10.5. 4. Recent bilateral pulmonary embolism/left gastrocnemius DVT-Anita Hoffman was discharged on Lovenox 80 mg twice daily.  She is off Lovenox and started on heparin GTT. 5. 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 Marin Olp as outpatient.  She is on FOLFOX.   6. Thrombocytopenia-likely in setting of chemotherapy. 7. Iron deficiency anemia-Anita Hoffman will receive 1 dose of Feraheme before discharge.   8. His HIV-resumed HIV medication Biktarvy 9. Transaminitis-Anita Hoffman has elevated LFTs with alk phos 277, AST 295, ALT 77.   LFTs have improved today AST is 75, ALT 60.  Alk phos is 197.  Likely  from liver mets    SpO2: 97 %   COVID-19 Labs  Lab Results  Component Value Date   SARSCOV2NAA NEGATIVE 12/13/2019   [place] NEGATIVE 11/29/2019     CBG: No results for input(s): GLUCAP in the last 168 hours.  CBC: Recent Labs  Lab 12/12/19 0900 12/12/19 0900 12/13/19 1043 12/13/19 1401 12/14/19 0441 12/14/19 1410 12/15/19 0441 12/15/19 0441 12/15/19 0758 12/15/19 2214 12/16/19 0558 12/16/19 1500 12/17/19 0445  WBC 10.9*  --  15.2*  --  12.7*  --  10.3  --   --   --  8.0  --  4.7  NEUTROABS 8.6*  --  13.5*  --   --   --   --   --   --   --   --   --   --   HGB 9.2*  --  10.0*   < > 9.9*   < > 8.7*   < > 8.5* 10.9* 10.5*  10.4* 11.3* 10.4*  HCT 29.5*   < > 32.7*   < > 32.5*   < > 28.5*   < > 27.5* 34.5* 32.9*  32.7* 35.4* 32.1*  MCV 81.0   < > 83.4  --  84.0  --  84.1  --   --   --  83.9  --  84.5  PLT 491*  --  616*  --  574*  --  452*  --   --   --  327  --  258   < > = values in this interval not  displayed.    Basic Metabolic Panel: Recent Labs  Lab 12/12/19 0900 12/13/19 1043 12/14/19 0441 12/16/19 0558 12/17/19 0445  NA 134* 133* 136 136 133*  K 3.4* 3.5 3.6 3.1* 3.2*  CL 97* 98 101 99 96*  CO2 _0 GLUCOSE 105* 148* 121* 100* 84  BUN _1 CREATININE 0.54 0.68 0.59 0.44 0.49  CALCIUM 8.4* 8.1* 8.0* 7.6* 7.5*     Liver Function Tests: Recent Labs  Lab 12/12/19 0900 12/13/19 1043 12/14/19 0441 12/16/19 0558 12/17/19 0445  AST 63* 59* 295* 75* 60*  ALT 23 28 77* 60* 44  ALKPHOS 254* 265* 277* 197* 190*  BILITOT 0.7 0.9 1.1 1.3* 1.4*  PROT 6.8 7.4 6.8 6.1* 5.6*  ALBUMIN 3.1* 2.6* 2.4* 2.2* 2.1*        DVT prophylaxis: SCDs/heparin  Code Status: Full code  Family Communication: No family at bedside  Disposition Plan:   Status is: Observation  Dispo: The Anita Hoffman is from: Home              Anticipated d/c is to: Home              Anticipated d/c date is: 12/17/2019              Anita Hoffman currently  admitted with rectal bleeding.  Barrier to discharge-might need surgical evaluation for colon surgery if she continues to bleed.        Scheduled medications:  . bictegravir-emtricitabine-tenofovir AF  1 tablet Oral Daily  . Chlorhexidine Gluconate Cloth  6 each Topical Daily  . enoxaparin (LOVENOX) injection  1 mg/kg Subcutaneous Q12H  . folic acid  2 mg Oral Daily  . furosemide  20 mg Intravenous Once  . pantoprazole  40 mg Oral BID  . potassium chloride  40 mEq Oral Q4H    Consultants:  Oncology  Procedures:    Antibiotics:   Anti-infectives (From admission, onward)   Start     Dose/Rate Route Frequency Ordered Stop   12/13/19 1600  bictegravir-emtricitabine-tenofovir AF (BIKTARVY) 50-200-25 MG per tablet 1 tablet     1 tablet Oral Daily 12/13/19 1233         Objective   Vitals:   12/16/19 0616 12/16/19 1403 12/16/19 2205 12/17/19 0506  BP: 111/82 104/72 109/82 109/71  Pulse: 75 96 91 92  Resp: _2 Temp: 97.8 F (36.6 C) 98.5 F (36.9 C) 99.8 F (37.7 C) 99.6 F (37.6 C)  TempSrc: Oral Oral Oral Oral  SpO2: 99% 98% 99% 97%  Weight:      Height:        Intake/Output Summary (Last 24 hours) at 12/17/2019 0914 Last data filed at 12/17/2019 0900 Gross per 24 hour  Intake 480 ml  Output 3 ml  Net 477 ml    05/15 1901 - 05/17 0700 In: 256 [P.O.:360; I.V.:50] Out: 5 [Urine:5]  Filed Weights   12/13/19 1656  Weight: 75.5 kg    Physical Examination:     General: Appears in no acute distress  Cardiovascular: S1-S2, regular, no murmur auscultated  Respiratory: Clear to auscultation bilaterally, no wheezing or crackles auscultated  Abdomen: Abdomen is soft, nontender, no organomegaly  Extremities: No edema in the lower extremities  Neurologic: Alert, oriented x3, no focal deficit noted    Data Reviewed:   Recent Results (from the past 240 hour(s))  SARS Coronavirus 2 by RT PCR (hospital order, performed in [place]  hospital  lab) Nasopharyngeal Nasopharyngeal Swab     Status: None   Collection Time: 12/13/19 12:11 PM   Specimen: Nasopharyngeal Swab  Result Value Ref Range Status   SARS Coronavirus 2 NEGATIVE NEGATIVE Final    Comment: (NOTE) SARS-CoV-2 target nucleic acids are NOT DETECTED. The SARS-CoV-2 RNA is generally detectable in upper and lower respiratory specimens during the acute phase of infection. The lowest concentration of SARS-CoV-2 viral copies this assay can detect is 250 copies / mL. A negative result does not preclude SARS-CoV-2 infection and should not be used as the sole basis for treatment or other Anita Hoffman management decisions.  A negative result may occur with improper specimen collection / handling, submission of specimen other than nasopharyngeal swab, presence of viral mutation(s) within the areas targeted by this assay, and inadequate number of viral copies (<250 copies / mL). A negative result must be combined with clinical observations, Anita Hoffman history, and epidemiological information. Fact Sheet for Patients:   StrictlyIdeas.no Fact Sheet for Healthcare Providers: BankingDealers.co.za This test is not yet approved or cleared  by the [place] FDA and has been authorized for detection and/or diagnosis of SARS-CoV-2 by FDA under an Emergency Use Authorization (EUA).  This EUA will remain in effect (meaning this test can be used) for the duration of the COVID-19 declaration under Section 564(b)(1) of the Act, 21 U.S.C. section 360bbb-3(b)(1), unless the authorization is terminated or revoked sooner. Performed at [facility], [place] [address], [place], [place] [address]         Oswald Hillock   [facility] If 7PM-7AM, please contact night-coverage at [email or web address], Office  [phone]   12/17/2019, 9:14 AM  LOS: 3 days

## 2019-12-18 ENCOUNTER — Inpatient Hospital Stay: Payer: Self-pay | Admitting: Critical Care Medicine

## 2019-12-18 NOTE — Progress Notes (Signed)
Pharmacist Chemotherapy Monitoring - Follow Up Assessment    I verify that I have reviewed each item in the below checklist:  . Regimen for the patient is scheduled for the appropriate day and plan matches scheduled date. Marland Kitchen Appropriate non-routine labs are ordered dependent on drug ordered. . If applicable, additional medications reviewed and ordered per protocol based on lifetime cumulative doses and/or treatment regimen.   Plan for follow-up and/or issues identified: No . I-vent associated with next due treatment: No . MD and/or nursing notified: No  Acquanetta Belling 12/18/2019 8:48 AM

## 2019-12-20 ENCOUNTER — Encounter: Payer: Self-pay | Admitting: *Deleted

## 2019-12-20 NOTE — Progress Notes (Signed)
Received a call from [facility] regarding patient testing and her being uninsured. They inquired whether we had reached out to the patient regarding her financially liability with testing.   I explained to them, that their billing department needed to reach out to the patient to discuss her financial situation and what her out of pocket cost would be, before initiating testing. They agreed and stated they would forward request to the billing department to make patient contact.

## 2019-12-21 ENCOUNTER — Encounter: Payer: Self-pay | Admitting: Pharmacy Technician

## 2019-12-21 ENCOUNTER — Telehealth: Payer: Self-pay | Admitting: General Practice

## 2019-12-21 NOTE — Telephone Encounter (Signed)
[place] CSW Progress Notes  Altamese Cabal, [place] advocate, states that she will screen patient by phone and assist w Medicaid and disability applications if appropriate.  Edwyna Shell, LCSW Clinical Social Worker Phone:  [phone] Cell:  [phone]

## 2019-12-21 NOTE — Telephone Encounter (Signed)
[place] CSW Progress Notes  Request from [facility] to call patient and assess for needs/resources.  Patient not available when I called.  Will try again.  Checked w [employer] [facility]).  Patient was not picked up while inpatient, so they are not working with her on Medicaid or disability applications - CSW requested they reach out to patient and assist if possible.  Edwyna Shell, LCSW Clinical Social Worker Phone:  [phone] Cell:  [phone]

## 2019-12-21 NOTE — Progress Notes (Signed)
Patient has been approved for drug assistance by [employer] for Feraheme. The enrollment period is from 12/21/19-12/20/20 based on self pay. First DOS covered is 12/17/19 with retro.

## 2019-12-25 ENCOUNTER — Other Ambulatory Visit: Payer: Self-pay

## 2019-12-25 ENCOUNTER — Inpatient Hospital Stay (HOSPITAL_BASED_OUTPATIENT_CLINIC_OR_DEPARTMENT_OTHER): Payer: Self-pay | Admitting: Hematology & Oncology

## 2019-12-25 ENCOUNTER — Inpatient Hospital Stay: Payer: Self-pay

## 2019-12-25 ENCOUNTER — Encounter: Payer: Self-pay | Admitting: *Deleted

## 2019-12-25 ENCOUNTER — Telehealth: Payer: Self-pay | Admitting: *Deleted

## 2019-12-25 DIAGNOSIS — D490 Neoplasm of unspecified behavior of digestive system: Secondary | ICD-10-CM

## 2019-12-25 DIAGNOSIS — C787 Secondary malignant neoplasm of liver and intrahepatic bile duct: Secondary | ICD-10-CM

## 2019-12-25 DIAGNOSIS — D122 Benign neoplasm of ascending colon: Secondary | ICD-10-CM

## 2019-12-25 DIAGNOSIS — C180 Malignant neoplasm of cecum: Secondary | ICD-10-CM

## 2019-12-25 LAB — CBC WITH DIFFERENTIAL (CANCER CENTER ONLY)
Abs Immature Granulocytes: 0.01 10*3/uL (ref 0.00–0.07)
Basophils Absolute: 0 10*3/uL (ref 0.0–0.1)
Basophils Relative: 1 %
Eosinophils Absolute: 0 10*3/uL (ref 0.0–0.5)
Eosinophils Relative: 1 %
HCT: 35.7 % — ABNORMAL LOW (ref 36.0–46.0)
Hemoglobin: 11.3 g/dL — ABNORMAL LOW (ref 12.0–15.0)
Immature Granulocytes: 0 %
Lymphocytes Relative: 63 %
Lymphs Abs: 1.4 10*3/uL (ref 0.7–4.0)
MCH: 26.8 pg (ref 26.0–34.0)
MCHC: 31.7 g/dL (ref 30.0–36.0)
MCV: 84.6 fL (ref 80.0–100.0)
Monocytes Absolute: 0.4 10*3/uL (ref 0.1–1.0)
Monocytes Relative: 16 %
Neutro Abs: 0.4 10*3/uL — CL (ref 1.7–7.7)
Neutrophils Relative %: 19 %
Platelet Count: 465 10*3/uL — ABNORMAL HIGH (ref 150–400)
RBC: 4.22 MIL/uL (ref 3.87–5.11)
RDW: 21.5 % — ABNORMAL HIGH (ref 11.5–15.5)
WBC Count: 2.3 10*3/uL — ABNORMAL LOW (ref 4.0–10.5)
nRBC: 0 % (ref 0.0–0.2)

## 2019-12-25 LAB — CMP (CANCER CENTER ONLY)
ALT: 13 U/L (ref 0–44)
AST: 52 U/L — ABNORMAL HIGH (ref 15–41)
Albumin: 3.2 g/dL — ABNORMAL LOW (ref 3.5–5.0)
Alkaline Phosphatase: 175 U/L — ABNORMAL HIGH (ref 38–126)
Anion gap: 10 (ref 5–15)
BUN: 6 mg/dL (ref 6–20)
CO2: 28 mmol/L (ref 22–32)
Calcium: 8.4 mg/dL — ABNORMAL LOW (ref 8.9–10.3)
Chloride: 98 mmol/L (ref 98–111)
Creatinine: 0.59 mg/dL (ref 0.44–1.00)
GFR, Est AFR Am: >60 mL/min (ref >60)
GFR, Estimated: >60 mL/min (ref >60)
Glucose, Bld: 129 mg/dL — ABNORMAL HIGH (ref 70–99)
Potassium: 3.1 mmol/L — ABNORMAL LOW (ref 3.5–5.1)
Sodium: 136 mmol/L (ref 135–145)
Total Bilirubin: 0.5 mg/dL (ref 0.3–1.2)
Total Protein: 6.7 g/dL (ref 6.5–8.1)

## 2019-12-25 NOTE — Patient Instructions (Signed)

## 2019-12-25 NOTE — Telephone Encounter (Signed)
Critical result reported by Richardson Landry.  Absolute Neutrophils .4 .  Dr Marin Olp notified.

## 2019-12-25 NOTE — Progress Notes (Signed)
Hematology and Oncology Follow Up Visit  Anita Hoffman [id number] 09-23-1960 59 y.o. 12/25/2019   Principle Diagnosis:   Metastatic adenocarcinoma of the cecum-liver metastasis  Pulmonary emboli and LEFT gastrocnemius vein thrombus  HIV-asymptomatic  Iron deficiency secondary to GI bleeding  Current Therapy:     FOLFOXIRI-s/p cycle 1 on 12/12/2019  Lovenox 80 mg subcu twice daily  Biktarvy 1 p.o. daily  IV iron-dose of Feraheme given on 12/12/2019  Neulasta 480 mcg sq post chemo     Interim History:  Anita Hoffman is back for her follow-up.  She actually was in the hospital after for cycle chemotherapy.  She had an isolated episode of lower GI bleeding.  She had bright red blood per rectum.  She was on Lovenox.  We held the Lovenox for a few days.  She did not require any type of endoscopy.  Gastroenterology saw her and did not feel that she needed to undergo endoscopy.  She had a blood transfusion.  She got some IV iron.  She was on some folic acid.  She is feeling a lot better.  She was discharged after couple days..  She is back on Lovenox right now.  She is not have any chest wall pain.  There is no shortness of breath.  Unfortunate, her white cell count is too low to be treated.  Her ANC is 400.  I think this might be a reflection of the HIV or the treatment for her HIV.  We are going to have to start her on Neulasta after chemotherapy.  Her labs are improving with her liver tests.  I have to believe that she is responding.  She is eating okay.  She is not having any melena or bright red blood per rectum.  She is having no issues with her urine.  There is no abdominal pain the right upper quadrant abdominal pain is improved.  Overall, I would have to say that her performance status is probably ECOG 1.   Medications:  Current Outpatient Medications:  .  bictegravir-emtricitabine-tenofovir AF (BIKTARVY) 50-200-25 MG TABS tablet, Take 1 tablet by mouth daily.,  Disp: 30 tablet, Rfl: 0 .  dexamethasone (DECADRON) 4 MG tablet, Take 2 tablets (8 mg total) by mouth daily. Start the day after chemotherapy for 3 days. Take with food., Disp: 8 tablet, Rfl: 5 .  enoxaparin (LOVENOX) 80 MG/0.8ML injection, Inject 0.8 mLs (80 mg total) into the skin 2 (two) times daily., Disp: 48 mL, Rfl: 0 .  folic acid (FOLVITE) 1 MG tablet, Take 2 tablets (2 mg total) by mouth daily., Disp: 30 tablet, Rfl: 1 .  lidocaine-prilocaine (EMLA) cream, Apply to affected area once (Patient taking differently: Apply 1 application topically as needed (port access). Apply to affected area once), Disp: 30 g, Rfl: 3 .  loperamide (IMODIUM A-D) 2 MG tablet, Take 2 at onset of diarrhea, then 1 every 2hrs until 12hr without a BM. May take 2 tab every 4hrs at bedtime. If diarrhea recurs repeat. (Patient taking differently: Take 2 mg by mouth 4 (four) times daily as needed for diarrhea or loose stools. Take 2 at onset of diarrhea, then 1 every 2hrs until 12hr without a BM. May take 2 tab every 4hrs at bedtime. If diarrhea recurs repeat.), Disp: 100 tablet, Rfl: 1 .  LORazepam (ATIVAN) 0.5 MG tablet, Take 1 tablet (0.5 mg total) by mouth every 6 (six) hours as needed for anxiety., Disp: 30 tablet, Rfl: 0 .  ondansetron (ZOFRAN) 8 MG  tablet, Take 1 tablet (8 mg total) by mouth 2 (two) times daily as needed. Start on day 3 after chemotherapy., Disp: 30 tablet, Rfl: 1 .  pantoprazole (PROTONIX) 40 MG tablet, Take 1 tablet (40 mg total) by mouth 2 (two) times daily., Disp: 60 tablet, Rfl: 1 .  prochlorperazine (COMPAZINE) 10 MG tablet, Take 1 tablet (10 mg total) by mouth every 6 (six) hours as needed (Nausea or vomiting)., Disp: 30 tablet, Rfl: 1  Allergies: No Known Allergies  Past Medical History, Surgical history, Social history, and Family History were reviewed and updated.  Review of Systems: Review of Systems  Constitutional: Negative.   HENT:  Negative.   Eyes: Negative.   Respiratory:  Negative.   Cardiovascular: Negative.   Gastrointestinal: Negative.   Endocrine: Negative.   Genitourinary: Negative.    Musculoskeletal: Negative.   Skin: Negative.   Neurological: Negative.   Hematological: Negative.     Physical Exam:  vitals were not taken for this visit.   Wt Readings from Last 3 Encounters:  12/13/19 166 lb 7.2 oz (75.5 kg)  12/12/19 166 lb 8 oz (75.5 kg)  12/06/19 170 lb 1.6 oz (77.2 kg)    Physical Exam Vitals reviewed.  HENT:     Head: Normocephalic and atraumatic.  Eyes:     Pupils: Pupils are equal, round, and reactive to light.  Cardiovascular:     Rate and Rhythm: Normal rate and regular rhythm.     Heart sounds: Normal heart sounds.  Pulmonary:     Effort: Pulmonary effort is normal.     Breath sounds: Normal breath sounds.  Abdominal:     General: Bowel sounds are normal.     Palpations: Abdomen is soft.  Musculoskeletal:        General: No tenderness or deformity. Normal range of motion.     Cervical back: Normal range of motion.  Lymphadenopathy:     Cervical: No cervical adenopathy.  Skin:    General: Skin is warm and dry.     Findings: No erythema or rash.  Neurological:     Mental Status: She is alert and oriented to person, place, and time.  Psychiatric:        Behavior: Behavior normal.        Thought Content: Thought content normal.        Judgment: Judgment normal.      Lab Results  Component Value Date   WBC 2.3 (L) 12/25/2019   HGB 11.3 (L) 12/25/2019   HCT 35.7 (L) 12/25/2019   MCV 84.6 12/25/2019   PLT 465 (H) 12/25/2019     Chemistry      Component Value Date/Time   NA 136 12/25/2019 0932   K 3.1 (L) 12/25/2019 0932   CL 98 12/25/2019 0932   CO2 28 12/25/2019 0932   BUN 6 12/25/2019 0932   CREATININE 0.59 12/25/2019 0932      Component Value Date/Time   CALCIUM 8.4 (L) 12/25/2019 0932   ALKPHOS 175 (H) 12/25/2019 0932   AST 52 (H) 12/25/2019 0932   ALT 13 12/25/2019 0932   BILITOT 0.5 12/25/2019  0932      Impression and Plan: Anita Hoffman is a very nice 59 year old African-American female with metastatic colon cancer.  The primary is in the cecum.  She has extensive liver metastasis.  We are going to hold her treatment today.  We want to see her white cell count improved.  Her monocytes are up so I would think  that her white cell count will improve.  I am not going to try to adjust the dose of treatment.  I just want to see how she does with the Neulasta.  She will come in on June 1 for her second cycle of treatment.  I will plan to see her back for her third cycle of treatment on June 15.  We will set her up with a CT scan after her fourth cycle of treatment.   Volanda Napoleon, MD 5/25/202112:40 PM

## 2019-12-27 ENCOUNTER — Inpatient Hospital Stay: Payer: Self-pay

## 2020-01-02 ENCOUNTER — Telehealth: Payer: Self-pay | Admitting: *Deleted

## 2020-01-02 ENCOUNTER — Inpatient Hospital Stay: Payer: Self-pay

## 2020-01-02 ENCOUNTER — Inpatient Hospital Stay: Payer: Self-pay | Attending: Hematology & Oncology

## 2020-01-02 ENCOUNTER — Encounter: Payer: Self-pay | Admitting: *Deleted

## 2020-01-02 ENCOUNTER — Telehealth: Payer: Self-pay | Admitting: Hematology & Oncology

## 2020-01-02 ENCOUNTER — Other Ambulatory Visit: Payer: Self-pay

## 2020-01-02 VITALS — BP 122/82 | HR 72 | Temp 97.5°F | Resp 20

## 2020-01-02 DIAGNOSIS — D490 Neoplasm of unspecified behavior of digestive system: Secondary | ICD-10-CM

## 2020-01-02 DIAGNOSIS — D122 Benign neoplasm of ascending colon: Secondary | ICD-10-CM

## 2020-01-02 DIAGNOSIS — Z7901 Long term (current) use of anticoagulants: Secondary | ICD-10-CM | POA: Insufficient documentation

## 2020-01-02 DIAGNOSIS — D500 Iron deficiency anemia secondary to blood loss (chronic): Secondary | ICD-10-CM | POA: Insufficient documentation

## 2020-01-02 DIAGNOSIS — Z79899 Other long term (current) drug therapy: Secondary | ICD-10-CM | POA: Insufficient documentation

## 2020-01-02 DIAGNOSIS — K922 Gastrointestinal hemorrhage, unspecified: Secondary | ICD-10-CM | POA: Insufficient documentation

## 2020-01-02 DIAGNOSIS — Z21 Asymptomatic human immunodeficiency virus [HIV] infection status: Secondary | ICD-10-CM | POA: Insufficient documentation

## 2020-01-02 DIAGNOSIS — C180 Malignant neoplasm of cecum: Secondary | ICD-10-CM | POA: Insufficient documentation

## 2020-01-02 DIAGNOSIS — I2699 Other pulmonary embolism without acute cor pulmonale: Secondary | ICD-10-CM | POA: Insufficient documentation

## 2020-01-02 DIAGNOSIS — C787 Secondary malignant neoplasm of liver and intrahepatic bile duct: Secondary | ICD-10-CM

## 2020-01-02 DIAGNOSIS — R112 Nausea with vomiting, unspecified: Secondary | ICD-10-CM | POA: Insufficient documentation

## 2020-01-02 DIAGNOSIS — Z5189 Encounter for other specified aftercare: Secondary | ICD-10-CM | POA: Insufficient documentation

## 2020-01-02 DIAGNOSIS — I82462 Acute embolism and thrombosis of left calf muscular vein: Secondary | ICD-10-CM | POA: Insufficient documentation

## 2020-01-02 DIAGNOSIS — Z5111 Encounter for antineoplastic chemotherapy: Secondary | ICD-10-CM | POA: Insufficient documentation

## 2020-01-02 LAB — CMP (CANCER CENTER ONLY)
ALT: 10 U/L (ref 0–44)
AST: 38 U/L (ref 15–41)
Albumin: 3.1 g/dL — ABNORMAL LOW (ref 3.5–5.0)
Alkaline Phosphatase: 181 U/L — ABNORMAL HIGH (ref 38–126)
Anion gap: 10 (ref 5–15)
BUN: 4 mg/dL — ABNORMAL LOW (ref 6–20)
CO2: 29 mmol/L (ref 22–32)
Calcium: 8.8 mg/dL — ABNORMAL LOW (ref 8.9–10.3)
Chloride: 98 mmol/L (ref 98–111)
Creatinine: 0.57 mg/dL (ref 0.44–1.00)
GFR, Est AFR Am: >60 mL/min (ref >60)
GFR, Estimated: >60 mL/min (ref >60)
Glucose, Bld: 105 mg/dL — ABNORMAL HIGH (ref 70–99)
Potassium: 3 mmol/L — CL (ref 3.5–5.1)
Sodium: 137 mmol/L (ref 135–145)
Total Bilirubin: 0.4 mg/dL (ref 0.3–1.2)
Total Protein: 6.7 g/dL (ref 6.5–8.1)

## 2020-01-02 LAB — CBC WITH DIFFERENTIAL (CANCER CENTER ONLY)
Abs Immature Granulocytes: 0.18 10*3/uL — ABNORMAL HIGH (ref 0.00–0.07)
Basophils Absolute: 0.1 10*3/uL (ref 0.0–0.1)
Basophils Relative: 1 %
Eosinophils Absolute: 0 10*3/uL (ref 0.0–0.5)
Eosinophils Relative: 1 %
HCT: 35.4 % — ABNORMAL LOW (ref 36.0–46.0)
Hemoglobin: 11.1 g/dL — ABNORMAL LOW (ref 12.0–15.0)
Immature Granulocytes: 3 %
Lymphocytes Relative: 37 %
Lymphs Abs: 2.7 10*3/uL (ref 0.7–4.0)
MCH: 27 pg (ref 26.0–34.0)
MCHC: 31.4 g/dL (ref 30.0–36.0)
MCV: 86.1 fL (ref 80.0–100.0)
Monocytes Absolute: 1 10*3/uL (ref 0.1–1.0)
Monocytes Relative: 14 %
Neutro Abs: 3.3 10*3/uL (ref 1.7–7.7)
Neutrophils Relative %: 44 %
Platelet Count: 673 10*3/uL — ABNORMAL HIGH (ref 150–400)
RBC: 4.11 MIL/uL (ref 3.87–5.11)
RDW: 22 % — ABNORMAL HIGH (ref 11.5–15.5)
WBC Count: 7.3 10*3/uL (ref 4.0–10.5)
nRBC: 0 % (ref 0.0–0.2)

## 2020-01-02 MED ORDER — SODIUM CHLORIDE 0.9 % IV SOLN
140.2500 mg/m2 | Freq: Once | INTRAVENOUS | Status: AC
  Administered 2020-01-02: 260 mg via INTRAVENOUS
  Filled 2020-01-02: qty 8

## 2020-01-02 MED ORDER — POTASSIUM CHLORIDE CRYS ER 20 MEQ PO TBCR
40.0000 meq | EXTENDED_RELEASE_TABLET | Freq: Two times a day (BID) | ORAL | Status: DC
  Administered 2020-01-02 (×2): 40 meq via ORAL
  Filled 2020-01-02: qty 2

## 2020-01-02 MED ORDER — ATROPINE SULFATE 1 MG/ML IJ SOLN
INTRAMUSCULAR | Status: AC
  Filled 2020-01-02: qty 1

## 2020-01-02 MED ORDER — PALONOSETRON HCL INJECTION 0.25 MG/5ML
0.2500 mg | Freq: Once | INTRAVENOUS | Status: AC
  Administered 2020-01-02: 0.25 mg via INTRAVENOUS

## 2020-01-02 MED ORDER — DEXTROSE 5 % IV SOLN
Freq: Once | INTRAVENOUS | Status: AC
  Filled 2020-01-02: qty 250

## 2020-01-02 MED ORDER — SODIUM CHLORIDE 0.9 % IV SOLN
2880.0000 mg/m2 | INTRAVENOUS | Status: DC
  Administered 2020-01-02: 5300 mg via INTRAVENOUS
  Filled 2020-01-02: qty 106

## 2020-01-02 MED ORDER — SODIUM CHLORIDE 0.9 % IV SOLN
10.0000 mg | Freq: Once | INTRAVENOUS | Status: AC
  Administered 2020-01-02: 10 mg via INTRAVENOUS
  Filled 2020-01-02: qty 10

## 2020-01-02 MED ORDER — OXALIPLATIN CHEMO INJECTION 100 MG/20ML
72.2500 mg/m2 | Freq: Once | INTRAVENOUS | Status: AC
  Administered 2020-01-02: 135 mg via INTRAVENOUS
  Filled 2020-01-02: qty 20

## 2020-01-02 MED ORDER — PALONOSETRON HCL INJECTION 0.25 MG/5ML
INTRAVENOUS | Status: AC
  Filled 2020-01-02: qty 5

## 2020-01-02 MED ORDER — LEUCOVORIN CALCIUM INJECTION 350 MG
200.0000 mg/m2 | Freq: Once | INTRAVENOUS | Status: AC
  Administered 2020-01-02: 368 mg via INTRAVENOUS
  Filled 2020-01-02: qty 18.4

## 2020-01-02 MED ORDER — POTASSIUM CHLORIDE CRYS ER 20 MEQ PO TBCR
40.0000 meq | EXTENDED_RELEASE_TABLET | Freq: Once | ORAL | 0 refills | Status: DC
Start: 2020-01-02 — End: 2020-02-05

## 2020-01-02 MED ORDER — SODIUM CHLORIDE 0.9 % IV SOLN
150.0000 mg | Freq: Once | INTRAVENOUS | Status: AC
  Administered 2020-01-02: 150 mg via INTRAVENOUS
  Filled 2020-01-02: qty 150

## 2020-01-02 MED ORDER — ATROPINE SULFATE 1 MG/ML IJ SOLN
0.5000 mg | Freq: Once | INTRAMUSCULAR | Status: AC | PRN
  Administered 2020-01-02: 0.5 mg via INTRAVENOUS

## 2020-01-02 NOTE — Progress Notes (Signed)
Oncology Nurse Navigator Documentation  Oncology Nurse Navigator Flowsheets 01/02/2020  Abnormal Finding Date -  Confirmed Diagnosis Date -  Diagnosis Status -  Planned Course of Treatment -  Phase of Treatment -  Chemotherapy Actual Start Date: -  Navigator Follow Up Date: 01/16/2020  Navigator Follow Up Reason: Follow-up Appointment;Chemotherapy  Navigator Restaurant manager, fast food Encounter Type Appt/Treatment Plan Review  Telephone -  Treatment Initiated Date -  Patient Visit Type -  Treatment Phase Active Tx  Barriers/Navigation Needs No Barriers At This Time  Education -  Interventions None Required  Acuity Level 3-Moderate Needs (3-4 Barriers Identified)  Referrals -  Coordination of Care -  Education Method -  Support Groups/Services Friends and Family  Time Spent with Patient 15

## 2020-01-02 NOTE — Patient Instructions (Signed)

## 2020-01-02 NOTE — Telephone Encounter (Signed)
Appointments scheduled calendar printed per 5/25 los 

## 2020-01-02 NOTE — Telephone Encounter (Signed)
Jenn from lab brought me a panic potassium level of 3.0. Results given to MD.

## 2020-01-02 NOTE — Patient Instructions (Signed)

## 2020-01-03 ENCOUNTER — Other Ambulatory Visit: Payer: Self-pay | Admitting: Infectious Disease

## 2020-01-03 ENCOUNTER — Telehealth: Payer: Self-pay | Admitting: Pharmacy Technician

## 2020-01-03 ENCOUNTER — Ambulatory Visit (INDEPENDENT_AMBULATORY_CARE_PROVIDER_SITE_OTHER): Payer: Self-pay | Admitting: Infectious Disease

## 2020-01-03 VITALS — BP 126/79 | HR 66 | Temp 98.0°F | Ht 62.0 in | Wt 158.3 lb

## 2020-01-03 DIAGNOSIS — C787 Secondary malignant neoplasm of liver and intrahepatic bile duct: Secondary | ICD-10-CM

## 2020-01-03 DIAGNOSIS — B20 Human immunodeficiency virus [HIV] disease: Secondary | ICD-10-CM

## 2020-01-03 DIAGNOSIS — C180 Malignant neoplasm of cecum: Secondary | ICD-10-CM

## 2020-01-03 DIAGNOSIS — I2699 Other pulmonary embolism without acute cor pulmonale: Secondary | ICD-10-CM

## 2020-01-03 MED ORDER — BIKTARVY 50-200-25 MG PO TABS: 1.0000 | ORAL_TABLET | Freq: Every day | ORAL | 6 refills | Status: DC

## 2020-01-03 MED ORDER — BIKTARVY 50-200-25 MG PO TABS: 1.0000 | ORAL_TABLET | Freq: Every day | ORAL | 0 refills | Status: DC

## 2020-01-03 NOTE — Telephone Encounter (Signed)
Fantastic!  ?

## 2020-01-03 NOTE — Telephone Encounter (Addendum)
[facility] Patient Advocate Encounter   Patient has been approved for [employer] Advancing Access Patient Assistance Program for [place] .   The patient previously was enrolled at [facility] transition of care and coverage has been extended with Advancing Access to remain active until her adap is approved. Active 12/03/2019 to 12/02/2020  The billing information is   Patient knows to call the office with questions or concerns. [employer] application was filled out at her appointment  Bartholomew Crews, [place] Patient [facility] Phone: [phone] Fax: [phone] 01/03/2020 11:56 AM

## 2020-01-03 NOTE — Progress Notes (Signed)
Subjective:  Chief complaint: she is here to followup for recently diagnosed HIV infection  Patient ID: Anita Hoffman, female    DOB: 1960-11-12, 59 y.o.   MRN: [id number]  HPI  Anita Hoffman is a 59 year old African American woman recently diagnosed with HIV disease (with healthy CD4 count and not that high of a viral load) but also with metastatic colon cancer and pulmonary emboli.  She is seeing Dr. Marin Olp with Oncology and receiving chemotherapy with FOLFOX.  We did same day initiation with Biktarvy in the hospital which she has tolerated without any problems.   She has had recent hypokalemia and leukopenia from chemotherapy.    Past Medical History:  Diagnosis Date  . Former smoker   . Goals of care, counseling/discussion 12/06/2019  . HIV infection ([place])   . Iron deficiency anemia due to chronic blood loss 12/12/2019  . Medical history non-contributory     Past Surgical History:  Procedure Laterality Date  . BIOPSY  12/01/2019   Procedure: BIOPSY;  Surgeon: Lavena Bullion, DO;  Location: [place] [facility];  Service: Gastroenterology;;  . COLONOSCOPY WITH PROPOFOL N/A 12/01/2019   Procedure: COLONOSCOPY WITH PROPOFOL;  Surgeon: Lavena Bullion, DO;  Location: [place];  Service: Gastroenterology;  Laterality: N/A;  . HEMOSTASIS CLIP PLACEMENT  12/01/2019   Procedure: HEMOSTASIS CLIP PLACEMENT;  Surgeon: Lavena Bullion, DO;  Location: [place];  Service: Gastroenterology;;  . IR IMAGING GUIDED PORT INSERTION  12/06/2019  . POLYPECTOMY  12/01/2019   Procedure: POLYPECTOMY;  Surgeon: Lavena Bullion, DO;  Location: [place] [facility];  Service: Gastroenterology;;  . SUBMUCOSAL TATTOO INJECTION  12/01/2019   Procedure: SUBMUCOSAL TATTOO INJECTION;  Surgeon: Lavena Bullion, DO;  Location: [facility];  Service: Gastroenterology;;  . TUBAL LIGATION      Family History  Problem Relation Age of Onset  . Ovarian cancer Maternal Grandmother   . Lung cancer Father         Social History   Socioeconomic History  . Marital status: Single    Spouse name: Not on file  . Number of children: 5  . Years of education: Not on file  . Highest education level: Not on file  Occupational History  . Not on file  Tobacco Use  . Smoking status: Former Smoker    Packs/day: 0.50    Types: Cigarettes    Quit date: 11/29/2003    Years since quitting: 16.1  . Smokeless tobacco: Never Used  Substance and Sexual Activity  . Alcohol use: Not Currently    Comment: 1990  . Drug use: Yes    Types: Marijuana    Comment: 1990  . Sexual activity: Not Currently  Other Topics Concern  . Not on file  Social History Narrative  . Not on file   Social Determinants of Health   Financial Resource Strain:   . Difficulty of Paying Living Expenses:   Food Insecurity:   . Worried About Charity fundraiser in the Last Year:   . Arboriculturist in the Last Year:   Transportation Needs:   . Film/video editor (Medical):   Marland Kitchen Lack of Transportation (Non-Medical):   Physical Activity:   . Days of Exercise per Week:   . Minutes of Exercise per Session:   Stress:   . Feeling of Stress :   Social Connections:   . Frequency of Communication with Friends and Family:   . Frequency of Social Gatherings with Friends and Family:   .  Attends Religious Services:   . Active Member of Clubs or Organizations:   . Attends Archivist Meetings:   Marland Kitchen Marital Status:     No Known Allergies   Current Outpatient Medications:  .  bictegravir-emtricitabine-tenofovir AF (BIKTARVY) 50-200-25 MG TABS tablet, Take 1 tablet by mouth daily., Disp: 30 tablet, Rfl: 0 .  dexamethasone (DECADRON) 4 MG tablet, Take 2 tablets (8 mg total) by mouth daily. Start the day after chemotherapy for 3 days. Take with food., Disp: 8 tablet, Rfl: 5 .  enoxaparin (LOVENOX) 80 MG/0.8ML injection, Inject 0.8 mLs (80 mg total) into the skin 2 (two) times daily., Disp: 48 mL, Rfl: 0 .  folic acid (FOLVITE) 1  MG tablet, Take 2 tablets (2 mg total) by mouth daily., Disp: 30 tablet, Rfl: 1 .  lidocaine-prilocaine (EMLA) cream, Apply to affected area once (Patient taking differently: Apply 1 application topically as needed (port access). Apply to affected area once), Disp: 30 g, Rfl: 3 .  loperamide (IMODIUM A-D) 2 MG tablet, Take 2 at onset of diarrhea, then 1 every 2hrs until 12hr without a BM. May take 2 tab every 4hrs at bedtime. If diarrhea recurs repeat. (Patient taking differently: Take 2 mg by mouth 4 (four) times daily as needed for diarrhea or loose stools. Take 2 at onset of diarrhea, then 1 every 2hrs until 12hr without a BM. May take 2 tab every 4hrs at bedtime. If diarrhea recurs repeat.), Disp: 100 tablet, Rfl: 1 .  LORazepam (ATIVAN) 0.5 MG tablet, Take 1 tablet (0.5 mg total) by mouth every 6 (six) hours as needed for anxiety., Disp: 30 tablet, Rfl: 0 .  ondansetron (ZOFRAN) 8 MG tablet, Take 1 tablet (8 mg total) by mouth 2 (two) times daily as needed. Start on day 3 after chemotherapy., Disp: 30 tablet, Rfl: 1 .  pantoprazole (PROTONIX) 40 MG tablet, Take 1 tablet (40 mg total) by mouth 2 (two) times daily., Disp: 60 tablet, Rfl: 1 .  potassium chloride SA (KLOR-CON) 20 MEQ tablet, Take 2 tablets (40 mEq total) by mouth once for 1 dose., Disp: 2 tablet, Rfl: 0 .  prochlorperazine (COMPAZINE) 10 MG tablet, Take 1 tablet (10 mg total) by mouth every 6 (six) hours as needed (Nausea or vomiting)., Disp: 30 tablet, Rfl: 1  Review of Systems  Constitutional: Negative for activity change, appetite change, chills, diaphoresis, fatigue, fever and unexpected weight change.  HENT: Negative for congestion, rhinorrhea, sinus pressure, sneezing, sore throat and trouble swallowing.   Eyes: Negative for photophobia and visual disturbance.  Respiratory: Negative for cough, chest tightness, shortness of breath, wheezing and stridor.   Cardiovascular: Negative for chest pain, palpitations and leg swelling.    Gastrointestinal: Negative for abdominal distention, abdominal pain, anal bleeding, blood in stool, constipation, diarrhea, nausea and vomiting.  Genitourinary: Negative for difficulty urinating, dysuria, flank pain and hematuria.  Musculoskeletal: Negative for arthralgias, back pain, gait problem, joint swelling and myalgias.  Skin: Negative for color change, pallor, rash and wound.  Neurological: Negative for dizziness, tremors, weakness and light-headedness.  Hematological: Negative for adenopathy. Does not bruise/bleed easily.  Psychiatric/Behavioral: Negative for agitation, behavioral problems, confusion, decreased concentration, dysphoric mood and sleep disturbance.       Objective:   Physical Exam Constitutional:      General: She is not in acute distress.    Appearance: She is not diaphoretic.  HENT:     Head: Normocephalic and atraumatic.     Right Ear: External ear normal.  Left Ear: External ear normal.     Nose: Nose normal.     Mouth/Throat:     Pharynx: No oropharyngeal exudate.  Eyes:     General: No scleral icterus.    Conjunctiva/sclera: Conjunctivae normal.     Pupils: Pupils are equal, round, and reactive to light.  Cardiovascular:     Rate and Rhythm: Normal rate and regular rhythm.  Pulmonary:     Effort: Pulmonary effort is normal. No respiratory distress.     Breath sounds: No wheezing.  Abdominal:     General: Bowel sounds are normal.     Palpations: Abdomen is soft.     Tenderness: There is no abdominal tenderness. There is no rebound.  Musculoskeletal:        General: No tenderness. Normal range of motion.     Cervical back: Normal range of motion and neck supple.  Lymphadenopathy:     Cervical: No cervical adenopathy.  Skin:    General: Skin is warm and dry.     Coloration: Skin is not pale.     Findings: No erythema or rash.  Neurological:     General: No focal deficit present.     Mental Status: She is alert and oriented to person,  place, and time.     Coordination: Coordination normal.  Psychiatric:        Mood and Affect: Mood normal.        Behavior: Behavior normal.        Thought Content: Thought content normal.        Judgment: Judgment normal.           Assessment & Plan:  HIV disease: will  Check  Her VL and CD4 today. I expect her VL will be undetectable. This unfortunately is the least of her problems  We have obtained further Biktarvy via Advancing access and she is enrolling into [employer]  PEs: on anticoagulation  Metastatic colon cancer: started on Folfox

## 2020-01-04 ENCOUNTER — Encounter: Payer: Self-pay | Admitting: Infectious Disease

## 2020-01-04 ENCOUNTER — Inpatient Hospital Stay: Payer: Self-pay

## 2020-01-04 ENCOUNTER — Other Ambulatory Visit: Payer: Self-pay

## 2020-01-04 VITALS — BP 117/64 | HR 65 | Temp 97.3°F | Resp 18

## 2020-01-04 DIAGNOSIS — C787 Secondary malignant neoplasm of liver and intrahepatic bile duct: Secondary | ICD-10-CM

## 2020-01-04 DIAGNOSIS — D122 Benign neoplasm of ascending colon: Secondary | ICD-10-CM

## 2020-01-04 DIAGNOSIS — D500 Iron deficiency anemia secondary to blood loss (chronic): Secondary | ICD-10-CM

## 2020-01-04 DIAGNOSIS — D490 Neoplasm of unspecified behavior of digestive system: Secondary | ICD-10-CM

## 2020-01-04 LAB — URINE CYTOLOGY ANCILLARY ONLY
Chlamydia: NEGATIVE
Comment: NEGATIVE
Comment: NORMAL
Neisseria Gonorrhea: NEGATIVE

## 2020-01-04 LAB — T-HELPER CELL (CD4) - (RCID CLINIC ONLY)
CD4 % Helper T Cell: 33 % (ref 33–65)
CD4 T Cell Abs: 413 /uL (ref 400–1790)

## 2020-01-04 MED ORDER — TBO-FILGRASTIM 480 MCG/0.8ML ~~LOC~~ SOSY: 480.0000 ug | PREFILLED_SYRINGE | Freq: Once | SUBCUTANEOUS | Status: DC

## 2020-01-04 MED ORDER — HEPARIN SOD (PORK) LOCK FLUSH 100 UNIT/ML IV SOLN
500.0000 [IU] | Freq: Once | INTRAVENOUS | Status: AC | PRN
  Administered 2020-01-04: 500 [IU]
  Filled 2020-01-04: qty 5

## 2020-01-04 MED ORDER — TBO-FILGRASTIM 480 MCG/0.8ML ~~LOC~~ SOSY
PREFILLED_SYRINGE | SUBCUTANEOUS | Status: AC
  Filled 2020-01-04: qty 0.8

## 2020-01-04 MED ORDER — PEGFILGRASTIM-CBQV 6 MG/0.6ML ~~LOC~~ SOSY
6.0000 mg | PREFILLED_SYRINGE | Freq: Once | SUBCUTANEOUS | Status: AC
  Administered 2020-01-04: 6 mg via SUBCUTANEOUS

## 2020-01-04 MED ORDER — PEGFILGRASTIM-CBQV 6 MG/0.6ML ~~LOC~~ SOSY
PREFILLED_SYRINGE | SUBCUTANEOUS | Status: AC
  Filled 2020-01-04: qty 0.6

## 2020-01-04 MED ORDER — SODIUM CHLORIDE 0.9% FLUSH
10.0000 mL | INTRAVENOUS | Status: DC | PRN
  Administered 2020-01-04: 10 mL
  Filled 2020-01-04: qty 10

## 2020-01-04 NOTE — Patient Instructions (Signed)

## 2020-01-04 NOTE — Progress Notes (Signed)
Confirmed w/ Dr. Marin Olp, he wants pt to get long-acting G-CSF Will enter order for [facility] & have pt complete application for [facility] assistance.  Kennith Center, Pharm.D., CPP 01/04/2020@1 :51 PM

## 2020-01-05 LAB — RPR: RPR Ser Ql: NONREACTIVE

## 2020-01-05 LAB — HIV-1 RNA QUANT-NO REFLEX-BLD
HIV 1 RNA Quant: <20 copies/mL — AB
HIV-1 RNA Quant, Log: <1.3 Log copies/mL — AB

## 2020-01-05 LAB — CBC WITH DIFFERENTIAL/PLATELET
Absolute Monocytes: 833 cells/uL (ref 200–950)
Basophils Absolute: 43 cells/uL (ref 0–200)
Basophils Relative: 0.5 %
Eosinophils Absolute: 0 cells/uL — ABNORMAL LOW (ref 15–500)
Eosinophils Relative: 0 %
HCT: 36.7 % (ref 35.0–45.0)
Hemoglobin: 11.7 g/dL (ref 11.7–15.5)
Lymphs Abs: 1428 cells/uL (ref 850–3900)
MCH: 27.5 pg (ref 27.0–33.0)
MCHC: 31.9 g/dL — ABNORMAL LOW (ref 32.0–36.0)
MCV: 86.2 fL (ref 80.0–100.0)
MPV: 9.4 fL (ref 7.5–12.5)
Monocytes Relative: 9.8 %
Neutro Abs: 6197 cells/uL (ref 1500–7800)
Neutrophils Relative %: 72.9 %
Platelets: 683 10*3/uL — ABNORMAL HIGH (ref 140–400)
RBC: 4.26 10*6/uL (ref 3.80–5.10)
RDW: 19.9 % — ABNORMAL HIGH (ref 11.0–15.0)
Total Lymphocyte: 16.8 %
WBC: 8.5 10*3/uL (ref 3.8–10.8)

## 2020-01-05 LAB — COMPLETE METABOLIC PANEL WITH GFR
AG Ratio: 1 (calc) (ref 1.0–2.5)
ALT: 15 U/L (ref 6–29)
AST: 38 U/L — ABNORMAL HIGH (ref 10–35)
Albumin: 3.3 g/dL — ABNORMAL LOW (ref 3.6–5.1)
Alkaline phosphatase (APISO): 192 U/L — ABNORMAL HIGH (ref 37–153)
BUN: 7 mg/dL (ref 7–25)
CO2: 28 mmol/L (ref 20–32)
Calcium: 9.1 mg/dL (ref 8.6–10.4)
Chloride: 100 mmol/L (ref 98–110)
Creat: 0.59 mg/dL (ref 0.50–1.05)
GFR, Est African American: 118 mL/min/{1.73_m2} (ref >=60)
GFR, Est Non African American: 102 mL/min/{1.73_m2} (ref >=60)
Globulin: 3.4 g/dL (calc) (ref 1.9–3.7)
Glucose, Bld: 131 mg/dL — ABNORMAL HIGH (ref 65–99)
Potassium: 4.1 mmol/L (ref 3.5–5.3)
Sodium: 140 mmol/L (ref 135–146)
Total Bilirubin: 0.5 mg/dL (ref 0.2–1.2)
Total Protein: 6.7 g/dL (ref 6.1–8.1)

## 2020-01-05 LAB — LIPID PANEL
Cholesterol: 124 mg/dL (ref <200)
HDL: 40 mg/dL — ABNORMAL LOW (ref >=50)
LDL Cholesterol (Calc): 64 mg/dL (calc)
Non-HDL Cholesterol (Calc): 84 mg/dL (calc) (ref <130)
Total CHOL/HDL Ratio: 3.1 (calc) (ref <5.0)
Triglycerides: 115 mg/dL (ref <150)

## 2020-01-07 ENCOUNTER — Encounter: Payer: Self-pay | Admitting: Pharmacy Technician

## 2020-01-07 NOTE — Progress Notes (Signed)
Patient has been approved for drug assistance by [employer] for [employer]. The enrollment period is from 01/04/20-01/02/21 based on self pay. First DOS covered is 01/04/20.

## 2020-01-08 ENCOUNTER — Other Ambulatory Visit: Payer: Self-pay | Admitting: Hematology & Oncology

## 2020-01-09 NOTE — Progress Notes (Signed)
Pharmacist Chemotherapy Monitoring - Follow Up Assessment    I verify that I have reviewed each item in the below checklist:   Regimen for the patient is scheduled for the appropriate day and plan matches scheduled date.  Appropriate non-routine labs are ordered dependent on drug ordered.  If applicable, additional medications reviewed and ordered per protocol based on lifetime cumulative doses and/or treatment regimen.   Plan for follow-up and/or issues identified: No  I-vent associated with next due treatment: Yes  MD and/or nursing notified: No  Liviah Cake, Jacqlyn Larsen 01/09/2020 9:11 AM

## 2020-01-10 ENCOUNTER — Encounter (HOSPITAL_COMMUNITY): Payer: Self-pay | Admitting: Hematology & Oncology

## 2020-01-16 ENCOUNTER — Encounter: Payer: Self-pay | Admitting: Hematology & Oncology

## 2020-01-16 ENCOUNTER — Inpatient Hospital Stay: Payer: Self-pay

## 2020-01-16 ENCOUNTER — Inpatient Hospital Stay (HOSPITAL_BASED_OUTPATIENT_CLINIC_OR_DEPARTMENT_OTHER): Payer: Self-pay | Admitting: Hematology & Oncology

## 2020-01-16 ENCOUNTER — Telehealth: Payer: Self-pay | Admitting: *Deleted

## 2020-01-16 ENCOUNTER — Encounter: Payer: Self-pay | Admitting: *Deleted

## 2020-01-16 ENCOUNTER — Other Ambulatory Visit: Payer: Self-pay

## 2020-01-16 VITALS — BP 116/89 | HR 97 | Temp 97.3°F | Resp 18 | Wt 152.0 lb

## 2020-01-16 DIAGNOSIS — D500 Iron deficiency anemia secondary to blood loss (chronic): Secondary | ICD-10-CM

## 2020-01-16 DIAGNOSIS — C180 Malignant neoplasm of cecum: Secondary | ICD-10-CM

## 2020-01-16 DIAGNOSIS — R110 Nausea: Secondary | ICD-10-CM

## 2020-01-16 DIAGNOSIS — C787 Secondary malignant neoplasm of liver and intrahepatic bile duct: Secondary | ICD-10-CM

## 2020-01-16 DIAGNOSIS — E876 Hypokalemia: Secondary | ICD-10-CM

## 2020-01-16 LAB — LACTATE DEHYDROGENASE: LDH: 240 U/L — ABNORMAL HIGH (ref 98–192)

## 2020-01-16 LAB — CBC WITH DIFFERENTIAL (CANCER CENTER ONLY)
Abs Immature Granulocytes: 0.05 10*3/uL (ref 0.00–0.07)
Basophils Absolute: 0 10*3/uL (ref 0.0–0.1)
Basophils Relative: 1 %
Eosinophils Absolute: 0.1 10*3/uL (ref 0.0–0.5)
Eosinophils Relative: 1 %
HCT: 39.3 % (ref 36.0–46.0)
Hemoglobin: 12.2 g/dL (ref 12.0–15.0)
Immature Granulocytes: 1 %
Lymphocytes Relative: 36 %
Lymphs Abs: 2.2 10*3/uL (ref 0.7–4.0)
MCH: 26.9 pg (ref 26.0–34.0)
MCHC: 31 g/dL (ref 30.0–36.0)
MCV: 86.6 fL (ref 80.0–100.0)
Monocytes Absolute: 0.6 10*3/uL (ref 0.1–1.0)
Monocytes Relative: 10 %
Neutro Abs: 3.1 10*3/uL (ref 1.7–7.7)
Neutrophils Relative %: 51 %
Platelet Count: 309 10*3/uL (ref 150–400)
RBC: 4.54 MIL/uL (ref 3.87–5.11)
RDW: 22.3 % — ABNORMAL HIGH (ref 11.5–15.5)
WBC Count: 6 10*3/uL (ref 4.0–10.5)
nRBC: 0 % (ref 0.0–0.2)

## 2020-01-16 LAB — CMP (CANCER CENTER ONLY)
ALT: 15 U/L (ref 0–44)
AST: 18 U/L (ref 15–41)
Albumin: 3.5 g/dL (ref 3.5–5.0)
Alkaline Phosphatase: 136 U/L — ABNORMAL HIGH (ref 38–126)
Anion gap: 10 (ref 5–15)
BUN: 8 mg/dL (ref 6–20)
CO2: 28 mmol/L (ref 22–32)
Calcium: 8.9 mg/dL (ref 8.9–10.3)
Chloride: 101 mmol/L (ref 98–111)
Creatinine: 0.61 mg/dL (ref 0.44–1.00)
GFR, Est AFR Am: >60 mL/min (ref >60)
GFR, Estimated: >60 mL/min (ref >60)
Glucose, Bld: 136 mg/dL — ABNORMAL HIGH (ref 70–99)
Potassium: 2.8 mmol/L — CL (ref 3.5–5.1)
Sodium: 139 mmol/L (ref 135–145)
Total Bilirubin: 0.4 mg/dL (ref 0.3–1.2)
Total Protein: 6.2 g/dL — ABNORMAL LOW (ref 6.5–8.1)

## 2020-01-16 LAB — SAMPLE TO BLOOD BANK

## 2020-01-16 LAB — IRON AND TIBC
Iron: 127 ug/dL (ref 41–142)
Saturation Ratios: 55 % (ref 21–57)
TIBC: 233 ug/dL — ABNORMAL LOW (ref 236–444)
UIBC: 105 ug/dL — ABNORMAL LOW (ref 120–384)

## 2020-01-16 LAB — FERRITIN: Ferritin: 2476 ng/mL — ABNORMAL HIGH (ref 11–307)

## 2020-01-16 LAB — CEA (IN HOUSE-CHCC): CEA (CHCC-In House): 1631.66 ng/mL — ABNORMAL HIGH (ref 0.00–5.00)

## 2020-01-16 MED ORDER — FAMOTIDINE IN NACL 20-0.9 MG/50ML-% IV SOLN
INTRAVENOUS | Status: AC
  Filled 2020-01-16: qty 50

## 2020-01-16 MED ORDER — SODIUM CHLORIDE 0.9% FLUSH
10.0000 mL | INTRAVENOUS | Status: DC | PRN
  Administered 2020-01-16: 10 mL via INTRAVENOUS
  Filled 2020-01-16: qty 10

## 2020-01-16 MED ORDER — SODIUM CHLORIDE 0.9 % IV SOLN
INTRAVENOUS | Status: AC
  Filled 2020-01-16: qty 250

## 2020-01-16 MED ORDER — PROCHLORPERAZINE MALEATE 10 MG PO TABS
10.0000 mg | ORAL_TABLET | Freq: Once | ORAL | Status: AC
  Administered 2020-01-16: 10 mg via ORAL

## 2020-01-16 MED ORDER — PROCHLORPERAZINE MALEATE 10 MG PO TABS
ORAL_TABLET | ORAL | Status: AC
  Filled 2020-01-16: qty 1

## 2020-01-16 MED ORDER — POTASSIUM CHLORIDE 10 MEQ/100ML IV SOLN: 10.0000 meq | INTRAVENOUS | Status: DC

## 2020-01-16 MED ORDER — HEPARIN SOD (PORK) LOCK FLUSH 100 UNIT/ML IV SOLN
500.0000 [IU] | Freq: Once | INTRAVENOUS | Status: AC
  Administered 2020-01-16: 500 [IU] via INTRAVENOUS
  Filled 2020-01-16: qty 5

## 2020-01-16 MED ORDER — SODIUM CHLORIDE 0.9 % IV SOLN
Freq: Once | INTRAVENOUS | Status: AC
  Filled 2020-01-16: qty 1000

## 2020-01-16 MED ORDER — PROCHLORPERAZINE EDISYLATE 10 MG/2ML IJ SOLN
10.0000 mg | Freq: Once | INTRAMUSCULAR | Status: DC
  Filled 2020-01-16: qty 2

## 2020-01-16 MED ORDER — SODIUM CHLORIDE 0.9 % IV SOLN
40.0000 mg | Freq: Once | INTRAVENOUS | Status: AC
  Administered 2020-01-16: 40 mg via INTRAVENOUS
  Filled 2020-01-16: qty 4

## 2020-01-16 NOTE — Patient Instructions (Signed)

## 2020-01-16 NOTE — Telephone Encounter (Signed)
Dr. Marin Olp notified of potassium 2.8.  Order received for pt to have IVF's and IV potassium today per Dr. Marin Olp.

## 2020-01-16 NOTE — Progress Notes (Signed)
Hematology and Oncology Follow Up Visit  Anita Hoffman [id number] 1961-04-04 59 y.o. 01/16/2020   Principle Diagnosis:   Metastatic adenocarcinoma of the cecum-liver metastasis  Pulmonary emboli and LEFT gastrocnemius vein thrombus  HIV-asymptomatic  Iron deficiency secondary to GI bleeding  Current Therapy:     FOLFOXIRI-s/p cycle #2 - started on 12/12/2019  Lovenox 80 mg subcu twice daily  Biktarvy 1 p.o. daily  IV iron-dose of Feraheme given on 12/12/2019  Neulasta 480 mcg sq post chemo     Interim History:  Anita Hoffman is back for her follow-up.  Unfortunately, she ate something bad yesterday.  She is having vomiting.  She is already vomited twice this morning.  She does not have any diarrhea.  She is having no abdominal pain.  There might be some discomfort in the epigastric area.  She is having no hemoptysis or hematemesis.  I think we are going to have to hold on her treatment today.  I think what ever she ate, we have to let move through her system.  Her potassium was quite low at 2.8.  We will give her some IV fluids and IV potassium today.  Her liver tests all continue to improve.  It will be interesting to see what her CEA level is.  She has had no fever.  She has had no rashes.  She has had no leg swelling..  She is doing well with the Lovenox.  She has had a couple areas of ecchymosis on her abdomen.  She has some subcutaneous calcification from the Lovenox injection.  She has had no chest wall discomfort.  There is no pleurisy.  Overall, her performance status is ECOG 1.   Medications:  Current Outpatient Medications:  .  bictegravir-emtricitabine-tenofovir AF (BIKTARVY) 50-200-25 MG TABS tablet, Take 1 tablet by mouth daily., Disp: 30 tablet, Rfl: 6 .  dexamethasone (DECADRON) 4 MG tablet, Take 2 tablets (8 mg total) by mouth daily. Start the day after chemotherapy for 3 days. Take with food., Disp: 8 tablet, Rfl: 5 .  folic acid (FOLVITE) 1 MG  tablet, Take 2 tablets (2 mg total) by mouth daily., Disp: 30 tablet, Rfl: 1 .  lidocaine-prilocaine (EMLA) cream, Apply to affected area once (Patient taking differently: Apply 1 application topically as needed (port access). Apply to affected area once), Disp: 30 g, Rfl: 3 .  LORazepam (ATIVAN) 0.5 MG tablet, Take 1 tablet (0.5 mg total) by mouth every 6 (six) hours as needed for anxiety., Disp: 30 tablet, Rfl: 0 .  ondansetron (ZOFRAN) 8 MG tablet, Take 1 tablet (8 mg total) by mouth 2 (two) times daily as needed. Start on day 3 after chemotherapy., Disp: 30 tablet, Rfl: 1 .  pantoprazole (PROTONIX) 40 MG tablet, Take 1 tablet (40 mg total) by mouth 2 (two) times daily., Disp: 60 tablet, Rfl: 1 .  prochlorperazine (COMPAZINE) 10 MG tablet, Take 1 tablet (10 mg total) by mouth every 6 (six) hours as needed (Nausea or vomiting)., Disp: 30 tablet, Rfl: 1 .  enoxaparin (LOVENOX) 80 MG/0.8ML injection, Inject 0.8 mLs (80 mg total) into the skin 2 (two) times daily., Disp: 48 mL, Rfl: 0 .  loperamide (IMODIUM A-D) 2 MG tablet, Take 2 at onset of diarrhea, then 1 every 2hrs until 12hr without a BM. May take 2 tab every 4hrs at bedtime. If diarrhea recurs repeat. (Patient not taking: Reported on 01/16/2020), Disp: 100 tablet, Rfl: 1 .  potassium chloride SA (KLOR-CON) 20 MEQ tablet, Take 2 tablets (  40 mEq total) by mouth once for 1 dose., Disp: 2 tablet, Rfl: 0  Allergies: No Known Allergies  Past Medical History, Surgical history, Social history, and Family History were reviewed and updated.  Review of Systems: Review of Systems  Constitutional: Negative.   HENT:  Negative.   Eyes: Negative.   Respiratory: Negative.   Cardiovascular: Negative.   Gastrointestinal: Negative.   Endocrine: Negative.   Genitourinary: Negative.    Musculoskeletal: Negative.   Skin: Negative.   Neurological: Negative.   Hematological: Negative.     Physical Exam:  weight is 152 lb (68.9 kg). Her oral temperature  is 97.3 F (36.3 C) (abnormal). Her blood pressure is 116/89 and her pulse is 97. Her respiration is 18 and oxygen saturation is 100%.   Wt Readings from Last 3 Encounters:  01/16/20 152 lb (68.9 kg)  01/03/20 158 lb 4.8 oz (71.8 kg)  12/13/19 166 lb 7.2 oz (75.5 kg)    Physical Exam Vitals reviewed.  HENT:     Head: Normocephalic and atraumatic.  Eyes:     Pupils: Pupils are equal, round, and reactive to light.  Cardiovascular:     Rate and Rhythm: Normal rate and regular rhythm.     Heart sounds: Normal heart sounds.  Pulmonary:     Effort: Pulmonary effort is normal.     Breath sounds: Normal breath sounds.  Abdominal:     General: Bowel sounds are normal.     Palpations: Abdomen is soft.  Musculoskeletal:        General: No tenderness or deformity. Normal range of motion.     Cervical back: Normal range of motion.  Lymphadenopathy:     Cervical: No cervical adenopathy.  Skin:    General: Skin is warm and dry.     Findings: No erythema or rash.  Neurological:     Mental Status: She is alert and oriented to person, place, and time.  Psychiatric:        Behavior: Behavior normal.        Thought Content: Thought content normal.        Judgment: Judgment normal.      Lab Results  Component Value Date   WBC 6.0 01/16/2020   HGB 12.2 01/16/2020   HCT 39.3 01/16/2020   MCV 86.6 01/16/2020   PLT 309 01/16/2020     Chemistry      Component Value Date/Time   NA 140 01/03/2020 1216   K 4.1 01/03/2020 1216   CL 100 01/03/2020 1216   CO2 28 01/03/2020 1216   BUN 7 01/03/2020 1216   CREATININE 0.59 01/03/2020 1216      Component Value Date/Time   CALCIUM 9.1 01/03/2020 1216   ALKPHOS 181 (H) 01/02/2020 0905   AST 38 (H) 01/03/2020 1216   AST 38 01/02/2020 0905   ALT 15 01/03/2020 1216   ALT 10 01/02/2020 0905   BILITOT 0.5 01/03/2020 1216   BILITOT 0.4 01/02/2020 0905      Impression and Plan: Anita Hoffman is a very nice 59 year old African-American  female with metastatic colon cancer.  The primary is in the cecum.  She has extensive liver metastasis.  Again, we will have to hold on her treatment today.  I just do not want to put anything else into the "equation" that could worsen her nausea and vomiting and then she could end up in the hospital with dehydration.  Have I do not see that is can be a problem to hold  on treating her 1 week.  We will go ahead do supportive therapy on her today.  We will have her come back in 1 week for hopefully her third cycle of treatment.  Volanda Napoleon, MD 6/16/202110:10 AM

## 2020-01-16 NOTE — Patient Instructions (Signed)

## 2020-01-16 NOTE — Progress Notes (Signed)
Oncology Nurse Navigator Documentation  Oncology Nurse Navigator Flowsheets 01/16/2020  Abnormal Finding Date -  Confirmed Diagnosis Date -  Diagnosis Status -  Planned Course of Treatment -  Phase of Treatment -  Chemotherapy Actual Start Date: -  Navigator Follow Up Date: 01/22/2020  Navigator Follow Up Reason: Follow-up Appointment;Chemotherapy  Navigator Location [facility]  Navigator Encounter Type Appt/Treatment Plan Review;Treatment  Telephone -  Treatment Initiated Date -  Patient Visit Type MedOnc  Treatment Phase Active Tx  Barriers/Navigation Needs Anxiety;Pain;Personal Conflicts  Education -  Interventions Psycho-Social Support  Acuity Level 3-Moderate Needs (3-4 Barriers Identified)  Referrals -  Coordination of Care -  Education Method -  Support Groups/Services Friends and Family  Time Spent with Patient 30

## 2020-01-18 ENCOUNTER — Inpatient Hospital Stay: Payer: Self-pay

## 2020-01-22 ENCOUNTER — Inpatient Hospital Stay (HOSPITAL_BASED_OUTPATIENT_CLINIC_OR_DEPARTMENT_OTHER): Payer: Self-pay | Admitting: Family

## 2020-01-22 ENCOUNTER — Other Ambulatory Visit: Payer: Self-pay

## 2020-01-22 ENCOUNTER — Inpatient Hospital Stay: Payer: Self-pay

## 2020-01-22 ENCOUNTER — Encounter: Payer: Self-pay | Admitting: *Deleted

## 2020-01-22 ENCOUNTER — Encounter: Payer: Self-pay | Admitting: Family

## 2020-01-22 ENCOUNTER — Telehealth: Payer: Self-pay | Admitting: Family

## 2020-01-22 VITALS — BP 113/86 | HR 85 | Temp 97.5°F | Resp 20 | Ht 62.0 in | Wt 150.0 lb

## 2020-01-22 DIAGNOSIS — C787 Secondary malignant neoplasm of liver and intrahepatic bile duct: Secondary | ICD-10-CM

## 2020-01-22 DIAGNOSIS — C180 Malignant neoplasm of cecum: Secondary | ICD-10-CM

## 2020-01-22 DIAGNOSIS — D122 Benign neoplasm of ascending colon: Secondary | ICD-10-CM

## 2020-01-22 DIAGNOSIS — D490 Neoplasm of unspecified behavior of digestive system: Secondary | ICD-10-CM

## 2020-01-22 DIAGNOSIS — D500 Iron deficiency anemia secondary to blood loss (chronic): Secondary | ICD-10-CM

## 2020-01-22 LAB — CBC WITH DIFFERENTIAL (CANCER CENTER ONLY)
Abs Immature Granulocytes: 0.04 10*3/uL (ref 0.00–0.07)
Basophils Absolute: 0.1 10*3/uL (ref 0.0–0.1)
Basophils Relative: 1 %
Eosinophils Absolute: 0.1 10*3/uL (ref 0.0–0.5)
Eosinophils Relative: 1 %
HCT: 36.1 % (ref 36.0–46.0)
Hemoglobin: 11.4 g/dL — ABNORMAL LOW (ref 12.0–15.0)
Immature Granulocytes: 1 %
Lymphocytes Relative: 34 %
Lymphs Abs: 2.8 10*3/uL (ref 0.7–4.0)
MCH: 27.6 pg (ref 26.0–34.0)
MCHC: 31.6 g/dL (ref 30.0–36.0)
MCV: 87.4 fL (ref 80.0–100.0)
Monocytes Absolute: 0.6 10*3/uL (ref 0.1–1.0)
Monocytes Relative: 8 %
Neutro Abs: 4.7 10*3/uL (ref 1.7–7.7)
Neutrophils Relative %: 55 %
Platelet Count: 432 10*3/uL — ABNORMAL HIGH (ref 150–400)
RBC: 4.13 MIL/uL (ref 3.87–5.11)
RDW: 22.3 % — ABNORMAL HIGH (ref 11.5–15.5)
WBC Count: 8.4 10*3/uL (ref 4.0–10.5)
nRBC: 0 % (ref 0.0–0.2)

## 2020-01-22 LAB — IRON AND TIBC
Iron: 103 ug/dL (ref 41–142)
Saturation Ratios: 53 % (ref 21–57)
TIBC: 196 ug/dL — ABNORMAL LOW (ref 236–444)
UIBC: 93 ug/dL — ABNORMAL LOW (ref 120–384)

## 2020-01-22 LAB — CMP (CANCER CENTER ONLY)
ALT: 14 U/L (ref 0–44)
AST: 21 U/L (ref 15–41)
Albumin: 3.3 g/dL — ABNORMAL LOW (ref 3.5–5.0)
Alkaline Phosphatase: 108 U/L (ref 38–126)
Anion gap: 8 (ref 5–15)
BUN: 5 mg/dL — ABNORMAL LOW (ref 6–20)
CO2: 31 mmol/L (ref 22–32)
Calcium: 8.9 mg/dL (ref 8.9–10.3)
Chloride: 100 mmol/L (ref 98–111)
Creatinine: 0.6 mg/dL (ref 0.44–1.00)
GFR, Est AFR Am: >60 mL/min (ref >60)
GFR, Estimated: >60 mL/min (ref >60)
Glucose, Bld: 121 mg/dL — ABNORMAL HIGH (ref 70–99)
Potassium: 3.7 mmol/L (ref 3.5–5.1)
Sodium: 139 mmol/L (ref 135–145)
Total Bilirubin: 0.4 mg/dL (ref 0.3–1.2)
Total Protein: 6.2 g/dL — ABNORMAL LOW (ref 6.5–8.1)

## 2020-01-22 LAB — FERRITIN: Ferritin: 2222 ng/mL — ABNORMAL HIGH (ref 11–307)

## 2020-01-22 LAB — CEA (IN HOUSE-CHCC): CEA (CHCC-In House): 1475.83 ng/mL — ABNORMAL HIGH (ref 0.00–5.00)

## 2020-01-22 MED ORDER — LEUCOVORIN CALCIUM INJECTION 350 MG
200.0000 mg/m2 | Freq: Once | INTRAVENOUS | Status: AC
  Administered 2020-01-22: 368 mg via INTRAVENOUS
  Filled 2020-01-22: qty 18.4

## 2020-01-22 MED ORDER — ATROPINE SULFATE 1 MG/ML IJ SOLN
INTRAMUSCULAR | Status: AC
  Filled 2020-01-22: qty 1

## 2020-01-22 MED ORDER — SODIUM CHLORIDE 0.9 % IV SOLN
2880.0000 mg/m2 | INTRAVENOUS | Status: DC
  Administered 2020-01-22: 5300 mg via INTRAVENOUS
  Filled 2020-01-22: qty 106

## 2020-01-22 MED ORDER — DEXTROSE 5 % IV SOLN
Freq: Once | INTRAVENOUS | Status: AC
  Filled 2020-01-22: qty 250

## 2020-01-22 MED ORDER — SODIUM CHLORIDE 0.9 % IV SOLN
10.0000 mg | Freq: Once | INTRAVENOUS | Status: AC
  Administered 2020-01-22: 10 mg via INTRAVENOUS
  Filled 2020-01-22: qty 10

## 2020-01-22 MED ORDER — PALONOSETRON HCL INJECTION 0.25 MG/5ML
0.2500 mg | Freq: Once | INTRAVENOUS | Status: AC
  Administered 2020-01-22: 0.25 mg via INTRAVENOUS

## 2020-01-22 MED ORDER — SODIUM CHLORIDE 0.9 % IV SOLN
150.0000 mg | Freq: Once | INTRAVENOUS | Status: AC
  Administered 2020-01-22: 150 mg via INTRAVENOUS
  Filled 2020-01-22: qty 150

## 2020-01-22 MED ORDER — SODIUM CHLORIDE 0.9 % IV SOLN
140.2500 mg/m2 | Freq: Once | INTRAVENOUS | Status: AC
  Administered 2020-01-22: 260 mg via INTRAVENOUS
  Filled 2020-01-22: qty 4

## 2020-01-22 MED ORDER — ATROPINE SULFATE 1 MG/ML IJ SOLN
0.5000 mg | Freq: Once | INTRAMUSCULAR | Status: AC | PRN
  Administered 2020-01-22: 0.5 mg via INTRAVENOUS

## 2020-01-22 MED ORDER — OXALIPLATIN CHEMO INJECTION 100 MG/20ML
72.2500 mg/m2 | Freq: Once | INTRAVENOUS | Status: AC
  Administered 2020-01-22: 135 mg via INTRAVENOUS
  Filled 2020-01-22: qty 20

## 2020-01-22 MED ORDER — PALONOSETRON HCL INJECTION 0.25 MG/5ML
INTRAVENOUS | Status: AC
  Filled 2020-01-22: qty 5

## 2020-01-22 NOTE — Progress Notes (Signed)
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

## 2020-01-22 NOTE — Telephone Encounter (Signed)
Appointments scheduled  Calendar printed per 6/22 los

## 2020-01-22 NOTE — Patient Instructions (Signed)
Fluorouracil, 5-FU injection What is this medicine? FLUOROURACIL, 5-FU (flure oh YOOR a sil) is a chemotherapy drug. It slows the growth of cancer cells. This medicine is used to treat many types of cancer like breast cancer, colon or rectal cancer, pancreatic cancer, and stomach cancer. This medicine may be used for other purposes; ask your health care provider or pharmacist if you have questions. COMMON BRAND NAME(S): Adrucil What should I tell my health care provider before I take this medicine? They need to know if you have any of these conditions:  blood disorders  dihydropyrimidine dehydrogenase (DPD) deficiency  infection (especially a virus infection such as chickenpox, cold sores, or herpes)  kidney disease  liver disease  malnourished, poor nutrition  recent or ongoing radiation therapy  an unusual or allergic reaction to fluorouracil, other chemotherapy, other medicines, foods, dyes, or preservatives  pregnant or trying to get pregnant  breast-feeding How should I use this medicine? This drug is given as an infusion or injection into a vein. It is administered in a hospital or clinic by a specially trained health care professional. Talk to your pediatrician regarding the use of this medicine in children. Special care may be needed. Overdosage: If you think you have taken too much of this medicine contact a poison control center or emergency room at once. NOTE: This medicine is only for you. Do not share this medicine with others. What if I miss a dose? It is important not to miss your dose. Call your doctor or health care professional if you are unable to keep an appointment. What may interact with this medicine?  allopurinol  cimetidine  dapsone  digoxin  hydroxyurea  leucovorin  levamisole  medicines for seizures like ethotoin, fosphenytoin, phenytoin  medicines to increase blood counts like filgrastim, pegfilgrastim, sargramostim  medicines that  treat or prevent blood clots like warfarin, enoxaparin, and dalteparin  methotrexate  metronidazole  pyrimethamine  some other chemotherapy drugs like busulfan, cisplatin, estramustine, vinblastine  trimethoprim  trimetrexate  vaccines Talk to your doctor or health care professional before taking any of these medicines:  acetaminophen  aspirin  ibuprofen  ketoprofen  naproxen This list may not describe all possible interactions. Give your health care provider a list of all the medicines, herbs, non-prescription drugs, or dietary supplements you use. Also tell them if you smoke, drink alcohol, or use illegal drugs. Some items may interact with your medicine. What should I watch for while using this medicine? Visit your doctor for checks on your progress. This drug may make you feel generally unwell. This is not uncommon, as chemotherapy can affect healthy cells as well as cancer cells. Report any side effects. Continue your course of treatment even though you feel ill unless your doctor tells you to stop. In some cases, you may be given additional medicines to help with side effects. Follow all directions for their use. Call your doctor or health care professional for advice if you get a fever, chills or sore throat, or other symptoms of a cold or flu. Do not treat yourself. This drug decreases your body's ability to fight infections. Try to avoid being around people who are sick. This medicine may increase your risk to bruise or bleed. Call your doctor or health care professional if you notice any unusual bleeding. Be careful brushing and flossing your teeth or using a toothpick because you may get an infection or bleed more easily. If you have any dental work done, tell your dentist you are  receiving this medicine. Avoid taking products that contain aspirin, acetaminophen, ibuprofen, naproxen, or ketoprofen unless instructed by your doctor. These medicines may hide a fever. Do not  become pregnant while taking this medicine. Women should inform their doctor if they wish to become pregnant or think they might be pregnant. There is a potential for serious side effects to an unborn child. Talk to your health care professional or pharmacist for more information. Do not breast-feed an infant while taking this medicine. Men should inform their doctor if they wish to father a child. This medicine may lower sperm counts. Do not treat diarrhea with over the counter products. Contact your doctor if you have diarrhea that lasts more than 2 days or if it is severe and watery. This medicine can make you more sensitive to the sun. Keep out of the sun. If you cannot avoid being in the sun, wear protective clothing and use sunscreen. Do not use sun lamps or tanning beds/booths. What side effects may I notice from receiving this medicine? Side effects that you should report to your doctor or health care professional as soon as possible:  allergic reactions like skin rash, itching or hives, swelling of the face, lips, or tongue  low blood counts - this medicine may decrease the number of white blood cells, red blood cells and platelets. You may be at increased risk for infections and bleeding.  signs of infection - fever or chills, cough, sore throat, pain or difficulty passing urine  signs of decreased platelets or bleeding - bruising, pinpoint red spots on the skin, black, tarry stools, blood in the urine  signs of decreased red blood cells - unusually weak or tired, fainting spells, lightheadedness  breathing problems  changes in vision  chest pain  mouth sores  nausea and vomiting  pain, swelling, redness at site where injected  pain, tingling, numbness in the hands or feet  redness, swelling, or sores on hands or feet  stomach pain  unusual bleeding Side effects that usually do not require medical attention (report to your doctor or health care professional if they  continue or are bothersome):  changes in finger or toe nails  diarrhea  dry or itchy skin  hair loss  headache  loss of appetite  sensitivity of eyes to the light  stomach upset  unusually teary eyes This list may not describe all possible side effects. Call your doctor for medical advice about side effects. You may report side effects to FDA at 1-800-FDA-1088. Where should I keep my medicine? This drug is given in a hospital or clinic and will not be stored at home. NOTE: This sheet is a summary. It may not cover all possible information. If you have questions about this medicine, talk to your doctor, pharmacist, or health care provider.  2020 [employer]/Gold Standard (2007-11-22 13:53:16) Irinotecan injection What is this medicine? IRINOTECAN (ir in oh TEE kan ) is a chemotherapy drug. It is used to treat colon and rectal cancer. This medicine may be used for other purposes; ask your health care provider or pharmacist if you have questions. COMMON BRAND NAME(S): Camptosar What should I tell my health care provider before I take this medicine? They need to know if you have any of these conditions:  dehydration  diarrhea  infection (especially a virus infection such as chickenpox, cold sores, or herpes)  liver disease  low blood counts, like low white cell, platelet, or red cell counts  low levels of calcium, magnesium, or  potassium in the blood  recent or ongoing radiation therapy  an unusual or allergic reaction to irinotecan, other medicines, foods, dyes, or preservatives  pregnant or trying to get pregnant  breast-feeding How should I use this medicine? This drug is given as an infusion into a vein. It is administered in a hospital or clinic by a specially trained health care professional. Talk to your pediatrician regarding the use of this medicine in children. Special care may be needed. Overdosage: If you think you have taken too much of this medicine  contact a poison control center or emergency room at once. NOTE: This medicine is only for you. Do not share this medicine with others. What if I miss a dose? It is important not to miss your dose. Call your doctor or health care professional if you are unable to keep an appointment. What may interact with this medicine? This medicine may interact with the following medications:  antiviral medicines for HIV or AIDS  certain antibiotics like rifampin or rifabutin  certain medicines for fungal infections like itraconazole, ketoconazole, posaconazole, and voriconazole  certain medicines for seizures like carbamazepine, phenobarbital, phenotoin  clarithromycin  gemfibrozil  nefazodone  St. John's Wort This list may not describe all possible interactions. Give your health care provider a list of all the medicines, herbs, non-prescription drugs, or dietary supplements you use. Also tell them if you smoke, drink alcohol, or use illegal drugs. Some items may interact with your medicine. What should I watch for while using this medicine? Your condition will be monitored carefully while you are receiving this medicine. You will need important blood work done while you are taking this medicine. This drug may make you feel generally unwell. This is not uncommon, as chemotherapy can affect healthy cells as well as cancer cells. Report any side effects. Continue your course of treatment even though you feel ill unless your doctor tells you to stop. In some cases, you may be given additional medicines to help with side effects. Follow all directions for their use. You may get drowsy or dizzy. Do not drive, use machinery, or do anything that needs mental alertness until you know how this medicine affects you. Do not stand or sit up quickly, especially if you are an older patient. This reduces the risk of dizzy or fainting spells. Call your health care professional for advice if you get a fever, chills,  or sore throat, or other symptoms of a cold or flu. Do not treat yourself. This medicine decreases your body's ability to fight infections. Try to avoid being around people who are sick. Avoid taking products that contain aspirin, acetaminophen, ibuprofen, naproxen, or ketoprofen unless instructed by your doctor. These medicines may hide a fever. This medicine may increase your risk to bruise or bleed. Call your doctor or health care professional if you notice any unusual bleeding. Be careful brushing and flossing your teeth or using a toothpick because you may get an infection or bleed more easily. If you have any dental work done, tell your dentist you are receiving this medicine. Do not become pregnant while taking this medicine or for 6 months after stopping it. Women should inform their health care professional if they wish to become pregnant or think they might be pregnant. Men should not father a child while taking this medicine and for 3 months after stopping it. There is potential for serious side effects to an unborn child. Talk to your health care professional for more information. Do not  breast-feed an infant while taking this medicine or for 7 days after stopping it. This medicine has caused ovarian failure in some women. This medicine may make it more difficult to get pregnant. Talk to your health care professional if you are concerned about your fertility. This medicine has caused decreased sperm counts in some men. This may make it more difficult to father a child. Talk to your health care professional if you are concerned about your fertility. What side effects may I notice from receiving this medicine? Side effects that you should report to your doctor or health care professional as soon as possible:  allergic reactions like skin rash, itching or hives, swelling of the face, lips, or tongue  chest pain  diarrhea  flushing, runny nose, sweating during infusion  low blood counts -  this medicine may decrease the number of white blood cells, red blood cells and platelets. You may be at increased risk for infections and bleeding.  nausea, vomiting  pain, swelling, warmth in the leg  signs of decreased platelets or bleeding - bruising, pinpoint red spots on the skin, black, tarry stools, blood in the urine  signs of infection - fever or chills, cough, sore throat, pain or difficulty passing urine  signs of decreased red blood cells - unusually weak or tired, fainting spells, lightheadedness Side effects that usually do not require medical attention (report to your doctor or health care professional if they continue or are bothersome):  constipation  hair loss  headache  loss of appetite  mouth sores  stomach pain This list may not describe all possible side effects. Call your doctor for medical advice about side effects. You may report side effects to FDA at 1-800-FDA-1088. Where should I keep my medicine? This drug is given in a hospital or clinic and will not be stored at home. NOTE: This sheet is a summary. It may not cover all possible information. If you have questions about this medicine, talk to your doctor, pharmacist, or health care provider.  2020 [employer]/Gold Standard (2018-09-08 10:09:17) Leucovorin injection What is this medicine? LEUCOVORIN (loo koe VOR in) is used to prevent or treat the harmful effects of some medicines. This medicine is used to treat anemia caused by a low amount of folic acid in the body. It is also used with 5-fluorouracil (5-FU) to treat colon cancer. This medicine may be used for other purposes; ask your health care provider or pharmacist if you have questions. What should I tell my health care provider before I take this medicine? They need to know if you have any of these conditions:  anemia from low levels of vitamin B-12 in the blood  an unusual or allergic reaction to leucovorin, folic acid, other medicines, foods,  dyes, or preservatives  pregnant or trying to get pregnant  breast-feeding How should I use this medicine? This medicine is for injection into a muscle or into a vein. It is given by a health care professional in a hospital or clinic setting. Talk to your pediatrician regarding the use of this medicine in children. Special care may be needed. Overdosage: If you think you have taken too much of this medicine contact a poison control center or emergency room at once. NOTE: This medicine is only for you. Do not share this medicine with others. What if I miss a dose? This does not apply. What may interact with this medicine?  capecitabine  fluorouracil  phenobarbital  phenytoin  primidone  trimethoprim-sulfamethoxazole This list may  not describe all possible interactions. Give your health care provider a list of all the medicines, herbs, non-prescription drugs, or dietary supplements you use. Also tell them if you smoke, drink alcohol, or use illegal drugs. Some items may interact with your medicine. What should I watch for while using this medicine? Your condition will be monitored carefully while you are receiving this medicine. This medicine may increase the side effects of 5-fluorouracil, 5-FU. Tell your doctor or health care professional if you have diarrhea or mouth sores that do not get better or that get worse. What side effects may I notice from receiving this medicine? Side effects that you should report to your doctor or health care professional as soon as possible:  allergic reactions like skin rash, itching or hives, swelling of the face, lips, or tongue  breathing problems  fever, infection  mouth sores  unusual bleeding or bruising  unusually weak or tired Side effects that usually do not require medical attention (report to your doctor or health care professional if they continue or are bothersome):  constipation or diarrhea  loss of appetite  nausea,  vomiting This list may not describe all possible side effects. Call your doctor for medical advice about side effects. You may report side effects to FDA at 1-800-FDA-1088. Where should I keep my medicine? This drug is given in a hospital or clinic and will not be stored at home. NOTE: This sheet is a summary. It may not cover all possible information. If you have questions about this medicine, talk to your doctor, pharmacist, or health care provider.  2020 Elsevier/Gold Standard (2008-01-23 16:50:29) Oxaliplatin Injection What is this medicine? OXALIPLATIN (ox AL i PLA tin) is a chemotherapy drug. It targets fast dividing cells, like cancer cells, and causes these cells to die. This medicine is used to treat cancers of the colon and rectum, and many other cancers. This medicine may be used for other purposes; ask your health care provider or pharmacist if you have questions. COMMON BRAND NAME(S): Eloxatin What should I tell my health care provider before I take this medicine? They need to know if you have any of these conditions:  heart disease  history of irregular heartbeat  liver disease  low blood counts, like white cells, platelets, or red blood cells  lung or breathing disease, like asthma  take medicines that treat or prevent blood clots  tingling of the fingers or toes, or other nerve disorder  an unusual or allergic reaction to oxaliplatin, other chemotherapy, other medicines, foods, dyes, or preservatives  pregnant or trying to get pregnant  breast-feeding How should I use this medicine? This drug is given as an infusion into a vein. It is administered in a hospital or clinic by a specially trained health care professional. Talk to your pediatrician regarding the use of this medicine in children. Special care may be needed. Overdosage: If you think you have taken too much of this medicine contact a poison control center or emergency room at once. NOTE: This medicine  is only for you. Do not share this medicine with others. What if I miss a dose? It is important not to miss a dose. Call your doctor or health care professional if you are unable to keep an appointment. What may interact with this medicine? Do not take this medicine with any of the following medications:  cisapride  dronedarone  pimozide  thioridazine This medicine may also interact with the following medications:  aspirin and aspirin-like medicines  certain medicines that treat or prevent blood clots like warfarin, apixaban, dabigatran, and rivaroxaban  cisplatin  cyclosporine  diuretics  medicines for infection like acyclovir, adefovir, amphotericin B, bacitracin, cidofovir, foscarnet, ganciclovir, gentamicin, pentamidine, vancomycin  NSAIDs, medicines for pain and inflammation, like ibuprofen or naproxen  other medicines that prolong the QT interval (an abnormal heart rhythm)  pamidronate  zoledronic acid This list may not describe all possible interactions. Give your health care provider a list of all the medicines, herbs, non-prescription drugs, or dietary supplements you use. Also tell them if you smoke, drink alcohol, or use illegal drugs. Some items may interact with your medicine. What should I watch for while using this medicine? Your condition will be monitored carefully while you are receiving this medicine. You may need blood work done while you are taking this medicine. This medicine may make you feel generally unwell. This is not uncommon as chemotherapy can affect healthy cells as well as cancer cells. Report any side effects. Continue your course of treatment even though you feel ill unless your healthcare professional tells you to stop. This medicine can make you more sensitive to cold. Do not drink cold drinks or use ice. Cover exposed skin before coming in contact with cold temperatures or cold objects. When out in cold weather wear warm clothing and cover  your mouth and nose to warm the air that goes into your lungs. Tell your doctor if you get sensitive to the cold. Do not become pregnant while taking this medicine or for 9 months after stopping it. Women should inform their health care professional if they wish to become pregnant or think they might be pregnant. Men should not father a child while taking this medicine and for 6 months after stopping it. There is potential for serious side effects to an unborn child. Talk to your health care professional for more information. Do not breast-feed a child while taking this medicine or for 3 months after stopping it. This medicine has caused ovarian failure in some women. This medicine may make it more difficult to get pregnant. Talk to your health care professional if you are concerned about your fertility. This medicine has caused decreased sperm counts in some men. This may make it more difficult to father a child. Talk to your health care professional if you are concerned about your fertility. This medicine may increase your risk of getting an infection. Call your health care professional for advice if you get a fever, chills, or sore throat, or other symptoms of a cold or flu. Do not treat yourself. Try to avoid being around people who are sick. Avoid taking medicines that contain aspirin, acetaminophen, ibuprofen, naproxen, or ketoprofen unless instructed by your health care professional. These medicines may hide a fever. Be careful brushing or flossing your teeth or using a toothpick because you may get an infection or bleed more easily. If you have any dental work done, tell your dentist you are receiving this medicine. What side effects may I notice from receiving this medicine? Side effects that you should report to your doctor or health care professional as soon as possible:  allergic reactions like skin rash, itching or hives, swelling of the face, lips, or tongue  breathing  problems  cough  low blood counts - this medicine may decrease the number of white blood cells, red blood cells, and platelets. You may be at increased risk for infections and bleeding  nausea, vomiting  pain, redness, or irritation at site  where injected  pain, tingling, numbness in the hands or feet  signs and symptoms of bleeding such as bloody or black, tarry stools; red or dark brown urine; spitting up blood or brown material that looks like coffee grounds; red spots on the skin; unusual bruising or bleeding from the eyes, gums, or nose  signs and symptoms of a dangerous change in heartbeat or heart rhythm like chest pain; dizziness; fast, irregular heartbeat; palpitations; feeling faint or lightheaded; falls  signs and symptoms of infection like fever; chills; cough; sore throat; pain or trouble passing urine  signs and symptoms of liver injury like dark yellow or brown urine; general ill feeling or flu-like symptoms; light-colored stools; loss of appetite; nausea; right upper belly pain; unusually weak or tired; yellowing of the eyes or skin  signs and symptoms of low red blood cells or anemia such as unusually weak or tired; feeling faint or lightheaded; falls  signs and symptoms of muscle injury like dark urine; trouble passing urine or change in the amount of urine; unusually weak or tired; muscle pain; back pain Side effects that usually do not require medical attention (report to your doctor or health care professional if they continue or are bothersome):  changes in taste  diarrhea  gas  hair loss  loss of appetite  mouth sores This list may not describe all possible side effects. Call your doctor for medical advice about side effects. You may report side effects to FDA at 1-800-FDA-1088. Where should I keep my medicine? This drug is given in a hospital or clinic and will not be stored at home. NOTE: This sheet is a summary. It may not cover all possible  information. If you have questions about this medicine, talk to your doctor, pharmacist, or health care provider.  2020 [employer] Standard (2018-12-06 12:20:35)

## 2020-01-22 NOTE — Addendum Note (Signed)
Addended by: Burney Gauze R on: 01/22/2020 10:51 AM   Modules accepted: Orders

## 2020-01-22 NOTE — Patient Instructions (Signed)

## 2020-01-22 NOTE — Progress Notes (Addendum)
Hematology and Oncology Follow Up Visit  Anita Hoffman [id number] [birth year] 59 y.o. 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 daily IV iron as indicated  Udenyca sq post chemo   Interim History:  [name] is here today for follow-up and treatment. She is feeling much better now and states that her appetite is improving.  Her potassium is back up to 3.7, chloride 100.  She is hydrating well. Her weight is down 2 lbs at 50 lbs. CEA last week was 1,631.  No fever, chills, n/v, cough, rash, dizziness, SOB, chest pain, palpitations, abdominal pain or changes in bowel or bladder habits.  She has not noted any blood loss. She has a few healing bruises along her abdomen at her Lovenox injection sites.  No swelling, tenderness, numbness or tingling in her extremities.  No falls or syncope. She is walking for exercise.   ECOG Performance Status: 1 - Symptomatic but completely ambulatory  Medications:  Allergies as of 01/22/2020   No Known Allergies     Medication List       Accurate as of January 22, 2020 10:08 AM. If you have any questions, ask your nurse or doctor.        Biktarvy 50-200-25 MG Tabs tablet Generic drug: bictegravir-emtricitabine-tenofovir AF Take 1 tablet by mouth daily.   dexamethasone 4 MG tablet Commonly known as: DECADRON Take 2 tablets (8 mg total) by mouth daily. Start the day after chemotherapy for 3 days. Take with food.   enoxaparin 80 MG/0.8ML injection Commonly known as: LOVENOX Inject 0.8 mLs (80 mg total) into the skin 2 (two) times daily.   folic acid 1 MG tablet Commonly known as: FOLVITE Take 2 tablets (2 mg total) by mouth daily.   lidocaine-prilocaine  cream Commonly known as: EMLA Apply to affected area once What changed:   how much to take  how to take this  when to take this  reasons to take this   loperamide 2 MG tablet Commonly known as: Imodium A-D Take 2 at onset of diarrhea, then 1 every 2hrs until 12hr without a BM. May take 2 tab every 4hrs at bedtime. If diarrhea recurs repeat.   LORazepam 0.5 MG tablet Commonly known as: Ativan Take 1 tablet (0.5 mg total) by mouth every 6 (six) hours as needed for anxiety.   ondansetron 8 MG tablet Commonly known as: Zofran Take 1 tablet (8 mg total) by mouth 2 (two) times daily as needed. Start on day 3 after chemotherapy.   pantoprazole 40 MG tablet Commonly known as: PROTONIX Take 1 tablet (40 mg total) by mouth 2 (two) times daily.   potassium chloride SA 20 MEQ tablet Commonly known as: KLOR-CON Take 2 tablets (40 mEq total) by mouth once for 1 dose.   prochlorperazine 10 MG tablet Commonly known as: COMPAZINE Take 1 tablet (10 mg total) by mouth every 6 (six) hours as needed (Nausea or vomiting).       Allergies: No Known Allergies  Past Medical History, Surgical history, Social history, and Family History were reviewed and updated.  Review of Systems: All other 10 point review of systems is negative.   Physical Exam:  height is 5\' 2"  (1.575 m) and weight is 150  lb (68 kg). Her oral temperature is 97.5 F (36.4 C) (abnormal). Her blood pressure is 113/86 and her pulse is 85. Her respiration is 20 and oxygen saturation is 99%.   Wt Readings from Last 3 Encounters:  01/22/20 150 lb (68 kg)  01/16/20 152 lb (68.9 kg)  01/03/20 158 lb 4.8 oz (71.8 kg)    Ocular: Sclerae unicteric, pupils equal, round and reactive to light Ear-nose-throat: Oropharynx clear, dentition fair Lymphatic: No cervical or supraclavicular adenopathy Lungs no rales or rhonchi, good excursion bilaterally Heart regular rate and rhythm, no murmur appreciated Abd soft, nontender,  positive bowel sounds, no liver or spleen tip palpated on exam, no fluid wave  MSK no focal spinal tenderness, no joint edema Neuro: non-focal, well-oriented, appropriate affect Breasts: Deferred   Lab Results  Component Value Date   WBC 8.4 01/22/2020   HGB 11.4 (L) 01/22/2020   HCT 36.1 01/22/2020   MCV 87.4 01/22/2020   PLT 432 (H) 01/22/2020   Lab Results  Component Value Date   FERRITIN 2,476 (H) 01/16/2020   IRON 127 01/16/2020   TIBC 233 (L) 01/16/2020   UIBC 105 (L) 01/16/2020   IRONPCTSAT 55 01/16/2020   Lab Results  Component Value Date   RBC 4.13 01/22/2020   No results found for: KPAFRELGTCHN, LAMBDASER, KAPLAMBRATIO No results found for: IGGSERUM, IGA, IGMSERUM No results found for: Odetta Pink, SPEI   Chemistry      Component Value Date/Time   NA 139 01/22/2020 0931   K 3.7 01/22/2020 0931   CL 100 01/22/2020 0931   CO2 31 01/22/2020 0931   BUN 5 (L) 01/22/2020 0931   CREATININE 0.60 01/22/2020 0931   CREATININE 0.59 01/03/2020 1216      Component Value Date/Time   CALCIUM 8.9 01/22/2020 0931   ALKPHOS 108 01/22/2020 0931   AST 21 01/22/2020 0931   ALT 14 01/22/2020 0931   BILITOT 0.4 01/22/2020 0931       Impression and Plan: [name] is a very pleasant African American female with metastatic colon cancer primarily of the cecum with extensive liver metastasis. She is feeling much better this week and her lab work has improved.  We will resume treatment today as planned and see her again in another 2 weeks.  She will contact our office with any questions or concerns. We can certainly see her sooner if needed.   Laverna Peace, NP 6/22/202110:08 AM

## 2020-01-24 ENCOUNTER — Other Ambulatory Visit: Payer: Self-pay

## 2020-01-24 ENCOUNTER — Inpatient Hospital Stay: Payer: Self-pay

## 2020-01-24 VITALS — BP 114/77 | HR 80 | Temp 97.5°F | Resp 20

## 2020-01-24 DIAGNOSIS — D490 Neoplasm of unspecified behavior of digestive system: Secondary | ICD-10-CM

## 2020-01-24 DIAGNOSIS — C787 Secondary malignant neoplasm of liver and intrahepatic bile duct: Secondary | ICD-10-CM

## 2020-01-24 DIAGNOSIS — D122 Benign neoplasm of ascending colon: Secondary | ICD-10-CM

## 2020-01-24 MED ORDER — HEPARIN SOD (PORK) LOCK FLUSH 100 UNIT/ML IV SOLN
500.0000 [IU] | Freq: Once | INTRAVENOUS | Status: AC | PRN
  Administered 2020-01-24: 500 [IU]
  Filled 2020-01-24: qty 5

## 2020-01-24 MED ORDER — SODIUM CHLORIDE 0.9% FLUSH
10.0000 mL | INTRAVENOUS | Status: DC | PRN
  Administered 2020-01-24: 10 mL
  Filled 2020-01-24: qty 10

## 2020-01-30 ENCOUNTER — Inpatient Hospital Stay: Payer: Self-pay

## 2020-01-31 ENCOUNTER — Encounter: Payer: Self-pay | Admitting: Infectious Disease

## 2020-01-31 ENCOUNTER — Encounter: Payer: Self-pay | Admitting: *Deleted

## 2020-01-31 NOTE — Progress Notes (Signed)
Dietitian reached out requesting follow up appointment with this patient.   Called and discussed appointment with patient. She is agreeable to a phone follow up appointment. Appointment set up with patient. Given time and date.

## 2020-02-01 ENCOUNTER — Inpatient Hospital Stay: Payer: Self-pay

## 2020-02-05 ENCOUNTER — Inpatient Hospital Stay: Payer: Self-pay | Attending: Hematology & Oncology

## 2020-02-05 ENCOUNTER — Encounter: Payer: Self-pay | Admitting: Family

## 2020-02-05 ENCOUNTER — Inpatient Hospital Stay (HOSPITAL_BASED_OUTPATIENT_CLINIC_OR_DEPARTMENT_OTHER): Payer: Self-pay | Admitting: Family

## 2020-02-05 ENCOUNTER — Inpatient Hospital Stay: Payer: Self-pay

## 2020-02-05 ENCOUNTER — Other Ambulatory Visit: Payer: Self-pay | Admitting: Family

## 2020-02-05 ENCOUNTER — Other Ambulatory Visit: Payer: Self-pay

## 2020-02-05 VITALS — BP 108/74 | HR 80 | Temp 98.8°F | Resp 16 | Wt 146.0 lb

## 2020-02-05 DIAGNOSIS — C180 Malignant neoplasm of cecum: Secondary | ICD-10-CM

## 2020-02-05 DIAGNOSIS — Z5189 Encounter for other specified aftercare: Secondary | ICD-10-CM | POA: Insufficient documentation

## 2020-02-05 DIAGNOSIS — E876 Hypokalemia: Secondary | ICD-10-CM

## 2020-02-05 DIAGNOSIS — Z21 Asymptomatic human immunodeficiency virus [HIV] infection status: Secondary | ICD-10-CM | POA: Insufficient documentation

## 2020-02-05 DIAGNOSIS — D490 Neoplasm of unspecified behavior of digestive system: Secondary | ICD-10-CM

## 2020-02-05 DIAGNOSIS — C787 Secondary malignant neoplasm of liver and intrahepatic bile duct: Secondary | ICD-10-CM

## 2020-02-05 DIAGNOSIS — D122 Benign neoplasm of ascending colon: Secondary | ICD-10-CM

## 2020-02-05 DIAGNOSIS — R634 Abnormal weight loss: Secondary | ICD-10-CM | POA: Insufficient documentation

## 2020-02-05 DIAGNOSIS — I82462 Acute embolism and thrombosis of left calf muscular vein: Secondary | ICD-10-CM | POA: Insufficient documentation

## 2020-02-05 DIAGNOSIS — K922 Gastrointestinal hemorrhage, unspecified: Secondary | ICD-10-CM | POA: Insufficient documentation

## 2020-02-05 DIAGNOSIS — D500 Iron deficiency anemia secondary to blood loss (chronic): Secondary | ICD-10-CM | POA: Insufficient documentation

## 2020-02-05 DIAGNOSIS — Z79899 Other long term (current) drug therapy: Secondary | ICD-10-CM | POA: Insufficient documentation

## 2020-02-05 DIAGNOSIS — Z5111 Encounter for antineoplastic chemotherapy: Secondary | ICD-10-CM | POA: Insufficient documentation

## 2020-02-05 DIAGNOSIS — I2699 Other pulmonary embolism without acute cor pulmonale: Secondary | ICD-10-CM | POA: Insufficient documentation

## 2020-02-05 DIAGNOSIS — Z7901 Long term (current) use of anticoagulants: Secondary | ICD-10-CM | POA: Insufficient documentation

## 2020-02-05 DIAGNOSIS — R630 Anorexia: Secondary | ICD-10-CM

## 2020-02-05 LAB — CMP (CANCER CENTER ONLY)
ALT: 11 U/L (ref 0–44)
AST: 16 U/L (ref 15–41)
Albumin: 3.4 g/dL — ABNORMAL LOW (ref 3.5–5.0)
Alkaline Phosphatase: 82 U/L (ref 38–126)
Anion gap: 10 (ref 5–15)
BUN: 7 mg/dL (ref 6–20)
CO2: 28 mmol/L (ref 22–32)
Calcium: 8.6 mg/dL — ABNORMAL LOW (ref 8.9–10.3)
Chloride: 102 mmol/L (ref 98–111)
Creatinine: 0.56 mg/dL (ref 0.44–1.00)
GFR, Est AFR Am: >60 mL/min (ref >60)
GFR, Estimated: >60 mL/min (ref >60)
Glucose, Bld: 106 mg/dL — ABNORMAL HIGH (ref 70–99)
Potassium: 3.1 mmol/L — ABNORMAL LOW (ref 3.5–5.1)
Sodium: 140 mmol/L (ref 135–145)
Total Bilirubin: 0.3 mg/dL (ref 0.3–1.2)
Total Protein: 6.1 g/dL — ABNORMAL LOW (ref 6.5–8.1)

## 2020-02-05 LAB — CBC WITH DIFFERENTIAL (CANCER CENTER ONLY)
Abs Immature Granulocytes: 0.01 10*3/uL (ref 0.00–0.07)
Basophils Absolute: 0 10*3/uL (ref 0.0–0.1)
Basophils Relative: 1 %
Eosinophils Absolute: 0.2 10*3/uL (ref 0.0–0.5)
Eosinophils Relative: 5 %
HCT: 32 % — ABNORMAL LOW (ref 36.0–46.0)
Hemoglobin: 10.3 g/dL — ABNORMAL LOW (ref 12.0–15.0)
Immature Granulocytes: 0 %
Lymphocytes Relative: 51 %
Lymphs Abs: 2 10*3/uL (ref 0.7–4.0)
MCH: 28.7 pg (ref 26.0–34.0)
MCHC: 32.2 g/dL (ref 30.0–36.0)
MCV: 89.1 fL (ref 80.0–100.0)
Monocytes Absolute: 0.4 10*3/uL (ref 0.1–1.0)
Monocytes Relative: 10 %
Neutro Abs: 1.2 10*3/uL — ABNORMAL LOW (ref 1.7–7.7)
Neutrophils Relative %: 33 %
Platelet Count: 338 10*3/uL (ref 150–400)
RBC: 3.59 MIL/uL — ABNORMAL LOW (ref 3.87–5.11)
RDW: 21.5 % — ABNORMAL HIGH (ref 11.5–15.5)
WBC Count: 3.8 10*3/uL — ABNORMAL LOW (ref 4.0–10.5)
nRBC: 0 % (ref 0.0–0.2)

## 2020-02-05 LAB — CEA (IN HOUSE-CHCC): CEA (CHCC-In House): 601.15 ng/mL — ABNORMAL HIGH (ref 0.00–5.00)

## 2020-02-05 LAB — LACTATE DEHYDROGENASE: LDH: 236 U/L — ABNORMAL HIGH (ref 98–192)

## 2020-02-05 MED ORDER — POTASSIUM CHLORIDE CRYS ER 20 MEQ PO TBCR: 20.0000 meq | EXTENDED_RELEASE_TABLET | Freq: Every day | ORAL | 1 refills | Status: DC

## 2020-02-05 MED ORDER — ALTEPLASE 2 MG IJ SOLR
2.0000 mg | Freq: Once | INTRAMUSCULAR | Status: DC | PRN
  Filled 2020-02-05: qty 2

## 2020-02-05 MED ORDER — DRONABINOL 2.5 MG PO CAPS: 2.5000 mg | ORAL_CAPSULE | Freq: Two times a day (BID) | ORAL | 1 refills | Status: DC

## 2020-02-05 MED ORDER — SODIUM CHLORIDE 0.9 % IV SOLN
150.0000 mg | Freq: Once | INTRAVENOUS | Status: AC
  Administered 2020-02-05: 150 mg via INTRAVENOUS
  Filled 2020-02-05: qty 150

## 2020-02-05 MED ORDER — SODIUM CHLORIDE 0.9 % IV SOLN
140.2500 mg/m2 | Freq: Once | INTRAVENOUS | Status: AC
  Administered 2020-02-05: 260 mg via INTRAVENOUS
  Filled 2020-02-05: qty 5

## 2020-02-05 MED ORDER — DEXTROSE 5 % IV SOLN
Freq: Once | INTRAVENOUS | Status: AC
  Filled 2020-02-05: qty 250

## 2020-02-05 MED ORDER — HEPARIN SOD (PORK) LOCK FLUSH 100 UNIT/ML IV SOLN
500.0000 [IU] | Freq: Once | INTRAVENOUS | Status: DC | PRN
  Filled 2020-02-05: qty 5

## 2020-02-05 MED ORDER — ATROPINE SULFATE 1 MG/ML IJ SOLN
INTRAMUSCULAR | Status: AC
  Filled 2020-02-05: qty 1

## 2020-02-05 MED ORDER — SODIUM CHLORIDE 0.9 % IV SOLN
2880.0000 mg/m2 | INTRAVENOUS | Status: DC
  Administered 2020-02-05: 5300 mg via INTRAVENOUS
  Filled 2020-02-05: qty 106

## 2020-02-05 MED ORDER — ATROPINE SULFATE 1 MG/ML IJ SOLN
0.5000 mg | Freq: Once | INTRAMUSCULAR | Status: AC | PRN
  Administered 2020-02-05: 0.5 mg via INTRAVENOUS

## 2020-02-05 MED ORDER — HEPARIN SOD (PORK) LOCK FLUSH 100 UNIT/ML IV SOLN
250.0000 [IU] | Freq: Once | INTRAVENOUS | Status: DC | PRN
  Filled 2020-02-05: qty 5

## 2020-02-05 MED ORDER — SODIUM CHLORIDE 0.9 % IV SOLN
10.0000 mg | Freq: Once | INTRAVENOUS | Status: AC
  Administered 2020-02-05: 10 mg via INTRAVENOUS
  Filled 2020-02-05: qty 10

## 2020-02-05 MED ORDER — PALONOSETRON HCL INJECTION 0.25 MG/5ML
INTRAVENOUS | Status: AC
  Filled 2020-02-05: qty 5

## 2020-02-05 MED ORDER — PALONOSETRON HCL INJECTION 0.25 MG/5ML
0.2500 mg | Freq: Once | INTRAVENOUS | Status: AC
  Administered 2020-02-05: 0.25 mg via INTRAVENOUS

## 2020-02-05 MED ORDER — SODIUM CHLORIDE 0.9% FLUSH
10.0000 mL | INTRAVENOUS | Status: DC | PRN
  Filled 2020-02-05: qty 10

## 2020-02-05 MED ORDER — OXALIPLATIN CHEMO INJECTION 100 MG/20ML
72.2500 mg/m2 | Freq: Once | INTRAVENOUS | Status: AC
  Administered 2020-02-05: 135 mg via INTRAVENOUS
  Filled 2020-02-05: qty 27

## 2020-02-05 MED ORDER — LEUCOVORIN CALCIUM INJECTION 350 MG
200.0000 mg/m2 | Freq: Once | INTRAVENOUS | Status: AC
  Administered 2020-02-05: 368 mg via INTRAVENOUS
  Filled 2020-02-05: qty 18.4

## 2020-02-05 MED ORDER — SODIUM CHLORIDE 0.9% FLUSH
3.0000 mL | INTRAVENOUS | Status: DC | PRN
  Filled 2020-02-05: qty 10

## 2020-02-05 NOTE — Patient Instructions (Signed)
Fluorouracil, 5-FU injection What is this medicine? FLUOROURACIL, 5-FU (flure oh YOOR a sil) is a chemotherapy drug. It slows the growth of cancer cells. This medicine is used to treat many types of cancer like breast cancer, colon or rectal cancer, pancreatic cancer, and stomach cancer. This medicine may be used for other purposes; ask your health care provider or pharmacist if you have questions. COMMON BRAND NAME(S): Adrucil What should I tell my health care provider before I take this medicine? They need to know if you have any of these conditions:  blood disorders  dihydropyrimidine dehydrogenase (DPD) deficiency  infection (especially a virus infection such as chickenpox, cold sores, or herpes)  kidney disease  liver disease  malnourished, poor nutrition  recent or ongoing radiation therapy  an unusual or allergic reaction to fluorouracil, other chemotherapy, other medicines, foods, dyes, or preservatives  pregnant or trying to get pregnant  breast-feeding How should I use this medicine? This drug is given as an infusion or injection into a vein. It is administered in a hospital or clinic by a specially trained health care professional. Talk to your pediatrician regarding the use of this medicine in children. Special care may be needed. Overdosage: If you think you have taken too much of this medicine contact a poison control center or emergency room at once. NOTE: This medicine is only for you. Do not share this medicine with others. What if I miss a dose? It is important not to miss your dose. Call your doctor or health care professional if you are unable to keep an appointment. What may interact with this medicine?  allopurinol  cimetidine  dapsone  digoxin  hydroxyurea  leucovorin  levamisole  medicines for seizures like ethotoin, fosphenytoin, phenytoin  medicines to increase blood counts like filgrastim, pegfilgrastim, sargramostim  medicines that  treat or prevent blood clots like warfarin, enoxaparin, and dalteparin  methotrexate  metronidazole  pyrimethamine  some other chemotherapy drugs like busulfan, cisplatin, estramustine, vinblastine  trimethoprim  trimetrexate  vaccines Talk to your doctor or health care professional before taking any of these medicines:  acetaminophen  aspirin  ibuprofen  ketoprofen  naproxen This list may not describe all possible interactions. Give your health care provider a list of all the medicines, herbs, non-prescription drugs, or dietary supplements you use. Also tell them if you smoke, drink alcohol, or use illegal drugs. Some items may interact with your medicine. What should I watch for while using this medicine? Visit your doctor for checks on your progress. This drug may make you feel generally unwell. This is not uncommon, as chemotherapy can affect healthy cells as well as cancer cells. Report any side effects. Continue your course of treatment even though you feel ill unless your doctor tells you to stop. In some cases, you may be given additional medicines to help with side effects. Follow all directions for their use. Call your doctor or health care professional for advice if you get a fever, chills or sore throat, or other symptoms of a cold or flu. Do not treat yourself. This drug decreases your body's ability to fight infections. Try to avoid being around people who are sick. This medicine may increase your risk to bruise or bleed. Call your doctor or health care professional if you notice any unusual bleeding. Be careful brushing and flossing your teeth or using a toothpick because you may get an infection or bleed more easily. If you have any dental work done, tell your dentist you are  receiving this medicine. Avoid taking products that contain aspirin, acetaminophen, ibuprofen, naproxen, or ketoprofen unless instructed by your doctor. These medicines may hide a fever. Do not  become pregnant while taking this medicine. Women should inform their doctor if they wish to become pregnant or think they might be pregnant. There is a potential for serious side effects to an unborn child. Talk to your health care professional or pharmacist for more information. Do not breast-feed an infant while taking this medicine. Men should inform their doctor if they wish to father a child. This medicine may lower sperm counts. Do not treat diarrhea with over the counter products. Contact your doctor if you have diarrhea that lasts more than 2 days or if it is severe and watery. This medicine can make you more sensitive to the sun. Keep out of the sun. If you cannot avoid being in the sun, wear protective clothing and use sunscreen. Do not use sun lamps or tanning beds/booths. What side effects may I notice from receiving this medicine? Side effects that you should report to your doctor or health care professional as soon as possible:  allergic reactions like skin rash, itching or hives, swelling of the face, lips, or tongue  low blood counts - this medicine may decrease the number of white blood cells, red blood cells and platelets. You may be at increased risk for infections and bleeding.  signs of infection - fever or chills, cough, sore throat, pain or difficulty passing urine  signs of decreased platelets or bleeding - bruising, pinpoint red spots on the skin, black, tarry stools, blood in the urine  signs of decreased red blood cells - unusually weak or tired, fainting spells, lightheadedness  breathing problems  changes in vision  chest pain  mouth sores  nausea and vomiting  pain, swelling, redness at site where injected  pain, tingling, numbness in the hands or feet  redness, swelling, or sores on hands or feet  stomach pain  unusual bleeding Side effects that usually do not require medical attention (report to your doctor or health care professional if they  continue or are bothersome):  changes in finger or toe nails  diarrhea  dry or itchy skin  hair loss  headache  loss of appetite  sensitivity of eyes to the light  stomach upset  unusually teary eyes This list may not describe all possible side effects. Call your doctor for medical advice about side effects. You may report side effects to FDA at 1-800-FDA-1088. Where should I keep my medicine? This drug is given in a hospital or clinic and will not be stored at home. NOTE: This sheet is a summary. It may not cover all possible information. If you have questions about this medicine, talk to your doctor, pharmacist, or health care provider.  2020 [employer]/Gold Standard (2007-11-22 13:53:16) Irinotecan injection What is this medicine? IRINOTECAN (ir in oh TEE kan ) is a chemotherapy drug. It is used to treat colon and rectal cancer. This medicine may be used for other purposes; ask your health care provider or pharmacist if you have questions. COMMON BRAND NAME(S): Camptosar What should I tell my health care provider before I take this medicine? They need to know if you have any of these conditions:  dehydration  diarrhea  infection (especially a virus infection such as chickenpox, cold sores, or herpes)  liver disease  low blood counts, like low white cell, platelet, or red cell counts  low levels of calcium, magnesium, or  potassium in the blood  recent or ongoing radiation therapy  an unusual or allergic reaction to irinotecan, other medicines, foods, dyes, or preservatives  pregnant or trying to get pregnant  breast-feeding How should I use this medicine? This drug is given as an infusion into a vein. It is administered in a hospital or clinic by a specially trained health care professional. Talk to your pediatrician regarding the use of this medicine in children. Special care may be needed. Overdosage: If you think you have taken too much of this medicine  contact a poison control center or emergency room at once. NOTE: This medicine is only for you. Do not share this medicine with others. What if I miss a dose? It is important not to miss your dose. Call your doctor or health care professional if you are unable to keep an appointment. What may interact with this medicine? This medicine may interact with the following medications:  antiviral medicines for HIV or AIDS  certain antibiotics like rifampin or rifabutin  certain medicines for fungal infections like itraconazole, ketoconazole, posaconazole, and voriconazole  certain medicines for seizures like carbamazepine, phenobarbital, phenotoin  clarithromycin  gemfibrozil  nefazodone  St. John's Wort This list may not describe all possible interactions. Give your health care provider a list of all the medicines, herbs, non-prescription drugs, or dietary supplements you use. Also tell them if you smoke, drink alcohol, or use illegal drugs. Some items may interact with your medicine. What should I watch for while using this medicine? Your condition will be monitored carefully while you are receiving this medicine. You will need important blood work done while you are taking this medicine. This drug may make you feel generally unwell. This is not uncommon, as chemotherapy can affect healthy cells as well as cancer cells. Report any side effects. Continue your course of treatment even though you feel ill unless your doctor tells you to stop. In some cases, you may be given additional medicines to help with side effects. Follow all directions for their use. You may get drowsy or dizzy. Do not drive, use machinery, or do anything that needs mental alertness until you know how this medicine affects you. Do not stand or sit up quickly, especially if you are an older patient. This reduces the risk of dizzy or fainting spells. Call your health care professional for advice if you get a fever, chills,  or sore throat, or other symptoms of a cold or flu. Do not treat yourself. This medicine decreases your body's ability to fight infections. Try to avoid being around people who are sick. Avoid taking products that contain aspirin, acetaminophen, ibuprofen, naproxen, or ketoprofen unless instructed by your doctor. These medicines may hide a fever. This medicine may increase your risk to bruise or bleed. Call your doctor or health care professional if you notice any unusual bleeding. Be careful brushing and flossing your teeth or using a toothpick because you may get an infection or bleed more easily. If you have any dental work done, tell your dentist you are receiving this medicine. Do not become pregnant while taking this medicine or for 6 months after stopping it. Women should inform their health care professional if they wish to become pregnant or think they might be pregnant. Men should not father a child while taking this medicine and for 3 months after stopping it. There is potential for serious side effects to an unborn child. Talk to your health care professional for more information. Do not  breast-feed an infant while taking this medicine or for 7 days after stopping it. This medicine has caused ovarian failure in some women. This medicine may make it more difficult to get pregnant. Talk to your health care professional if you are concerned about your fertility. This medicine has caused decreased sperm counts in some men. This may make it more difficult to father a child. Talk to your health care professional if you are concerned about your fertility. What side effects may I notice from receiving this medicine? Side effects that you should report to your doctor or health care professional as soon as possible:  allergic reactions like skin rash, itching or hives, swelling of the face, lips, or tongue  chest pain  diarrhea  flushing, runny nose, sweating during infusion  low blood counts -  this medicine may decrease the number of white blood cells, red blood cells and platelets. You may be at increased risk for infections and bleeding.  nausea, vomiting  pain, swelling, warmth in the leg  signs of decreased platelets or bleeding - bruising, pinpoint red spots on the skin, black, tarry stools, blood in the urine  signs of infection - fever or chills, cough, sore throat, pain or difficulty passing urine  signs of decreased red blood cells - unusually weak or tired, fainting spells, lightheadedness Side effects that usually do not require medical attention (report to your doctor or health care professional if they continue or are bothersome):  constipation  hair loss  headache  loss of appetite  mouth sores  stomach pain This list may not describe all possible side effects. Call your doctor for medical advice about side effects. You may report side effects to FDA at 1-800-FDA-1088. Where should I keep my medicine? This drug is given in a hospital or clinic and will not be stored at home. NOTE: This sheet is a summary. It may not cover all possible information. If you have questions about this medicine, talk to your doctor, pharmacist, or health care provider.  2020 [employer]/Gold Standard (2018-09-08 10:09:17) Leucovorin injection What is this medicine? LEUCOVORIN (loo koe VOR in) is used to prevent or treat the harmful effects of some medicines. This medicine is used to treat anemia caused by a low amount of folic acid in the body. It is also used with 5-fluorouracil (5-FU) to treat colon cancer. This medicine may be used for other purposes; ask your health care provider or pharmacist if you have questions. What should I tell my health care provider before I take this medicine? They need to know if you have any of these conditions:  anemia from low levels of vitamin B-12 in the blood  an unusual or allergic reaction to leucovorin, folic acid, other medicines, foods,  dyes, or preservatives  pregnant or trying to get pregnant  breast-feeding How should I use this medicine? This medicine is for injection into a muscle or into a vein. It is given by a health care professional in a hospital or clinic setting. Talk to your pediatrician regarding the use of this medicine in children. Special care may be needed. Overdosage: If you think you have taken too much of this medicine contact a poison control center or emergency room at once. NOTE: This medicine is only for you. Do not share this medicine with others. What if I miss a dose? This does not apply. What may interact with this medicine?  capecitabine  fluorouracil  phenobarbital  phenytoin  primidone  trimethoprim-sulfamethoxazole This list may  not describe all possible interactions. Give your health care provider a list of all the medicines, herbs, non-prescription drugs, or dietary supplements you use. Also tell them if you smoke, drink alcohol, or use illegal drugs. Some items may interact with your medicine. What should I watch for while using this medicine? Your condition will be monitored carefully while you are receiving this medicine. This medicine may increase the side effects of 5-fluorouracil, 5-FU. Tell your doctor or health care professional if you have diarrhea or mouth sores that do not get better or that get worse. What side effects may I notice from receiving this medicine? Side effects that you should report to your doctor or health care professional as soon as possible:  allergic reactions like skin rash, itching or hives, swelling of the face, lips, or tongue  breathing problems  fever, infection  mouth sores  unusual bleeding or bruising  unusually weak or tired Side effects that usually do not require medical attention (report to your doctor or health care professional if they continue or are bothersome):  constipation or diarrhea  loss of appetite  nausea,  vomiting This list may not describe all possible side effects. Call your doctor for medical advice about side effects. You may report side effects to FDA at 1-800-FDA-1088. Where should I keep my medicine? This drug is given in a hospital or clinic and will not be stored at home. NOTE: This sheet is a summary. It may not cover all possible information. If you have questions about this medicine, talk to your doctor, pharmacist, or health care provider.  2020 Elsevier/Gold Standard (2008-01-23 16:50:29) Oxaliplatin Injection What is this medicine? OXALIPLATIN (ox AL i PLA tin) is a chemotherapy drug. It targets fast dividing cells, like cancer cells, and causes these cells to die. This medicine is used to treat cancers of the colon and rectum, and many other cancers. This medicine may be used for other purposes; ask your health care provider or pharmacist if you have questions. COMMON BRAND NAME(S): Eloxatin What should I tell my health care provider before I take this medicine? They need to know if you have any of these conditions:  heart disease  history of irregular heartbeat  liver disease  low blood counts, like white cells, platelets, or red blood cells  lung or breathing disease, like asthma  take medicines that treat or prevent blood clots  tingling of the fingers or toes, or other nerve disorder  an unusual or allergic reaction to oxaliplatin, other chemotherapy, other medicines, foods, dyes, or preservatives  pregnant or trying to get pregnant  breast-feeding How should I use this medicine? This drug is given as an infusion into a vein. It is administered in a hospital or clinic by a specially trained health care professional. Talk to your pediatrician regarding the use of this medicine in children. Special care may be needed. Overdosage: If you think you have taken too much of this medicine contact a poison control center or emergency room at once. NOTE: This medicine  is only for you. Do not share this medicine with others. What if I miss a dose? It is important not to miss a dose. Call your doctor or health care professional if you are unable to keep an appointment. What may interact with this medicine? Do not take this medicine with any of the following medications:  cisapride  dronedarone  pimozide  thioridazine This medicine may also interact with the following medications:  aspirin and aspirin-like medicines  certain medicines that treat or prevent blood clots like warfarin, apixaban, dabigatran, and rivaroxaban  cisplatin  cyclosporine  diuretics  medicines for infection like acyclovir, adefovir, amphotericin B, bacitracin, cidofovir, foscarnet, ganciclovir, gentamicin, pentamidine, vancomycin  NSAIDs, medicines for pain and inflammation, like ibuprofen or naproxen  other medicines that prolong the QT interval (an abnormal heart rhythm)  pamidronate  zoledronic acid This list may not describe all possible interactions. Give your health care provider a list of all the medicines, herbs, non-prescription drugs, or dietary supplements you use. Also tell them if you smoke, drink alcohol, or use illegal drugs. Some items may interact with your medicine. What should I watch for while using this medicine? Your condition will be monitored carefully while you are receiving this medicine. You may need blood work done while you are taking this medicine. This medicine may make you feel generally unwell. This is not uncommon as chemotherapy can affect healthy cells as well as cancer cells. Report any side effects. Continue your course of treatment even though you feel ill unless your healthcare professional tells you to stop. This medicine can make you more sensitive to cold. Do not drink cold drinks or use ice. Cover exposed skin before coming in contact with cold temperatures or cold objects. When out in cold weather wear warm clothing and cover  your mouth and nose to warm the air that goes into your lungs. Tell your doctor if you get sensitive to the cold. Do not become pregnant while taking this medicine or for 9 months after stopping it. Women should inform their health care professional if they wish to become pregnant or think they might be pregnant. Men should not father a child while taking this medicine and for 6 months after stopping it. There is potential for serious side effects to an unborn child. Talk to your health care professional for more information. Do not breast-feed a child while taking this medicine or for 3 months after stopping it. This medicine has caused ovarian failure in some women. This medicine may make it more difficult to get pregnant. Talk to your health care professional if you are concerned about your fertility. This medicine has caused decreased sperm counts in some men. This may make it more difficult to father a child. Talk to your health care professional if you are concerned about your fertility. This medicine may increase your risk of getting an infection. Call your health care professional for advice if you get a fever, chills, or sore throat, or other symptoms of a cold or flu. Do not treat yourself. Try to avoid being around people who are sick. Avoid taking medicines that contain aspirin, acetaminophen, ibuprofen, naproxen, or ketoprofen unless instructed by your health care professional. These medicines may hide a fever. Be careful brushing or flossing your teeth or using a toothpick because you may get an infection or bleed more easily. If you have any dental work done, tell your dentist you are receiving this medicine. What side effects may I notice from receiving this medicine? Side effects that you should report to your doctor or health care professional as soon as possible:  allergic reactions like skin rash, itching or hives, swelling of the face, lips, or tongue  breathing  problems  cough  low blood counts - this medicine may decrease the number of white blood cells, red blood cells, and platelets. You may be at increased risk for infections and bleeding  nausea, vomiting  pain, redness, or irritation at site  where injected  pain, tingling, numbness in the hands or feet  signs and symptoms of bleeding such as bloody or black, tarry stools; red or dark brown urine; spitting up blood or brown material that looks like coffee grounds; red spots on the skin; unusual bruising or bleeding from the eyes, gums, or nose  signs and symptoms of a dangerous change in heartbeat or heart rhythm like chest pain; dizziness; fast, irregular heartbeat; palpitations; feeling faint or lightheaded; falls  signs and symptoms of infection like fever; chills; cough; sore throat; pain or trouble passing urine  signs and symptoms of liver injury like dark yellow or brown urine; general ill feeling or flu-like symptoms; light-colored stools; loss of appetite; nausea; right upper belly pain; unusually weak or tired; yellowing of the eyes or skin  signs and symptoms of low red blood cells or anemia such as unusually weak or tired; feeling faint or lightheaded; falls  signs and symptoms of muscle injury like dark urine; trouble passing urine or change in the amount of urine; unusually weak or tired; muscle pain; back pain Side effects that usually do not require medical attention (report to your doctor or health care professional if they continue or are bothersome):  changes in taste  diarrhea  gas  hair loss  loss of appetite  mouth sores This list may not describe all possible side effects. Call your doctor for medical advice about side effects. You may report side effects to FDA at 1-800-FDA-1088. Where should I keep my medicine? This drug is given in a hospital or clinic and will not be stored at home. NOTE: This sheet is a summary. It may not cover all possible  information. If you have questions about this medicine, talk to your doctor, pharmacist, or health care provider.  2020 [employer] Standard (2018-12-06 12:20:35)

## 2020-02-05 NOTE — Progress Notes (Signed)
Cbc and cmet reviewed by PA, Ok to treat. rx for K+  Called into pharmacy.

## 2020-02-05 NOTE — Patient Instructions (Signed)
Implanted Port Insertion, Care After °This sheet gives you information about how to care for yourself after your procedure. Your health care provider may also give you more specific instructions. If you have problems or questions, contact your health care provider. °What can I expect after the procedure? °After the procedure, it is common to have: °· Discomfort at the port insertion site. °· Bruising on the skin over the port. This should improve over 3-4 days. °Follow these instructions at home: °Port care °· After your port is placed, you will get a manufacturer's information card. The card has information about your port. Keep this card with you at all times. °· Take care of the port as told by your health care provider. Ask your health care provider if you or a family member can get training for taking care of the port at home. A home health care nurse may also take care of the port. °· Make sure to remember what type of port you have. °Incision care ° °  ° °· Follow instructions from your health care provider about how to take care of your port insertion site. Make sure you: °? Wash your hands with soap and water before and after you change your bandage (dressing). If soap and water are not available, use hand sanitizer. °? Change your dressing as told by your health care provider. °? Leave stitches (sutures), skin glue, or adhesive strips in place. These skin closures may need to stay in place for 2 weeks or longer. If adhesive strip edges start to loosen and curl up, you may trim the loose edges. Do not remove adhesive strips completely unless your health care provider tells you to do that. °· Check your port insertion site every day for signs of infection. Check for: °? Redness, swelling, or pain. °? Fluid or blood. °? Warmth. °? Pus or a bad smell. °Activity °· Return to your normal activities as told by your health care provider. Ask your health care provider what activities are safe for you. °· Do not  lift anything that is heavier than 10 lb (4.5 kg), or the limit that you are told, until your health care provider says that it is safe. °General instructions °· Take over-the-counter and prescription medicines only as told by your health care provider. °· Do not take baths, swim, or use a hot tub until your health care provider approves. Ask your health care provider if you may take showers. You may only be allowed to take sponge baths. °· Do not drive for 24 hours if you were given a sedative during your procedure. °· Wear a medical alert bracelet in case of an emergency. This will tell any health care providers that you have a port. °· Keep all follow-up visits as told by your health care provider. This is important. °Contact a health care provider if: °· You cannot flush your port with saline as directed, or you cannot draw blood from the port. °· You have a fever or chills. °· You have redness, swelling, or pain around your port insertion site. °· You have fluid or blood coming from your port insertion site. °· Your port insertion site feels warm to the touch. °· You have pus or a bad smell coming from the port insertion site. °Get help right away if: °· You have chest pain or shortness of breath. °· You have bleeding from your port that you cannot control. °Summary °· Take care of the port as told by your health   care provider. Keep the manufacturer's information card with you at all times. °· Change your dressing as told by your health care provider. °· Contact a health care provider if you have a fever or chills or if you have redness, swelling, or pain around your port insertion site. °· Keep all follow-up visits as told by your health care provider. °This information is not intended to replace advice given to you by your health care provider. Make sure you discuss any questions you have with your health care provider. °Document Revised: 02/14/2018 Document Reviewed: 02/14/2018 °Elsevier Patient Education ©  2020 [employer] ° °

## 2020-02-05 NOTE — Progress Notes (Addendum)
Hematology and Oncology Follow Up Visit  PAYSEN GOZA [id number] 1961-03-23 59 y.o. 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 daily IV iron as indicated  Udenyca sq post chemo   Interim History:  Ms. Wemhoff is here today for follow-up and treatment. She is doing well but is concerned with her weight loss despite eating. Her weight is down another 4 lbs.  She tried drinking Boost but did not tolerate the dairy well. I gave her the Boost Soothe juice to try and some coupons.  She does feel that she is hydrating well.  She has not been taking her potassium supplement. Level is 3.1. We will have her restart 20 meq PO daily.  She denies fatigue at this time.  No fever, chills, n/v, cough, rash, dizziness, SOB, chest pain, palpitations, abdominal pain or changes in bowel or bladder habits.  No episodes of bleeding. No bruising or petechiae. She is tolerating Lovenox well.  No swelling, tenderness, numbness or tingling in her extremities.  No falls or syncopal episodes.   ECOG Performance Status: 1 - Symptomatic but completely ambulatory  Medications:  Allergies as of 02/05/2020   No Known Allergies     Medication List       Accurate as of February 05, 2020  9:20 AM. If you have any questions, ask your nurse or doctor.        Biktarvy 50-200-25 MG Tabs tablet Generic drug: bictegravir-emtricitabine-tenofovir AF Take 1 tablet by mouth daily.   dexamethasone 4 MG tablet Commonly known as: DECADRON Take 2 tablets (8 mg total) by mouth daily. Start the day after chemotherapy for 3 days. Take with food.   enoxaparin 80 MG/0.8ML injection Commonly known as: LOVENOX Inject 0.8 mLs (80 mg total) into  the skin 2 (two) times daily.   folic acid 1 MG tablet Commonly known as: FOLVITE Take 2 tablets (2 mg total) by mouth daily.   lidocaine-prilocaine cream Commonly known as: EMLA Apply to affected area once What changed:   how much to take  how to take this  when to take this  reasons to take this   loperamide 2 MG tablet Commonly known as: Imodium A-D Take 2 at onset of diarrhea, then 1 every 2hrs until 12hr without a BM. May take 2 tab every 4hrs at bedtime. If diarrhea recurs repeat.   LORazepam 0.5 MG tablet Commonly known as: Ativan Take 1 tablet (0.5 mg total) by mouth every 6 (six) hours as needed for anxiety.   ondansetron 8 MG tablet Commonly known as: Zofran Take 1 tablet (8 mg total) by mouth 2 (two) times daily as needed. Start on day 3 after chemotherapy.   pantoprazole 40 MG tablet Commonly known as: PROTONIX Take 1 tablet (40 mg total) by mouth 2 (two) times daily.   potassium chloride SA 20 MEQ tablet Commonly known as: KLOR-CON Take 2 tablets (40 mEq total) by mouth once for 1 dose.   prochlorperazine 10 MG tablet Commonly known as: COMPAZINE Take 1 tablet (10 mg total) by mouth every 6 (six) hours as needed (Nausea or vomiting).       Allergies: No Known Allergies  Past Medical History, Surgical history, Social history, and Family History were reviewed and updated.  Review of Systems: All other 10  point review of systems is negative.   Physical Exam:  weight is 146 lb (66.2 kg). Her oral temperature is 98.8 F (37.1 C). Her blood pressure is 108/74 and her pulse is 80. Her respiration is 16 and oxygen saturation is 100%.   Wt Readings from Last 3 Encounters:  02/05/20 146 lb (66.2 kg)  01/22/20 150 lb (68 kg)  01/16/20 152 lb (68.9 kg)    Ocular: Sclerae unicteric, pupils equal, round and reactive to light Ear-nose-throat: Oropharynx clear, dentition fair Lymphatic: No cervical or supraclavicular adenopathy Lungs no rales or rhonchi,  good excursion bilaterally Heart regular rate and rhythm, no murmur appreciated Abd soft, nontender, positive bowel sounds, no liver or spleen tip palpated on exam, no fluid wave  MSK no focal spinal tenderness, no joint edema Neuro: non-focal, well-oriented, appropriate affect Breasts: Deferred   Lab Results  Component Value Date   WBC 3.8 (L) 02/05/2020   HGB 10.3 (L) 02/05/2020   HCT 32.0 (L) 02/05/2020   MCV 89.1 02/05/2020   PLT 338 02/05/2020   Lab Results  Component Value Date   FERRITIN 2,222 (H) 01/22/2020   IRON 103 01/22/2020   TIBC 196 (L) 01/22/2020   UIBC 93 (L) 01/22/2020   IRONPCTSAT 53 01/22/2020   Lab Results  Component Value Date   RBC 3.59 (L) 02/05/2020   No results found for: KPAFRELGTCHN, LAMBDASER, KAPLAMBRATIO No results found for: IGGSERUM, IGA, IGMSERUM No results found for: Odetta Pink, SPEI   Chemistry      Component Value Date/Time   NA 139 01/22/2020 0931   K 3.7 01/22/2020 0931   CL 100 01/22/2020 0931   CO2 31 01/22/2020 0931   BUN 5 (L) 01/22/2020 0931   CREATININE 0.60 01/22/2020 0931   CREATININE 0.59 01/03/2020 1216      Component Value Date/Time   CALCIUM 8.9 01/22/2020 0931   ALKPHOS 108 01/22/2020 0931   AST 21 01/22/2020 0931   ALT 14 01/22/2020 0931   BILITOT 0.4 01/22/2020 0931       Impression and Plan: Ms.Williamsis a very pleasant African American female with metastatic colon cancer primarily of the cecum with extensive liver metastasis. She is tolerating treatment well. CEA 2 weeks ago was down to 1,475.  We will proceed with cycle 4 today as planned.  She is due for repeat CT scans after this cycle so we will get these set up for her prior to her next follow-up in 2 weeks.  We will have her try Marinol 2.5 mg PO BID and see if this help boost her appetite and weight.  Potassium script sent as well.  She will contact our office with any questions or  concerns. We can certainly see her sooner if needed.   Laverna Peace, NP 7/6/20219:20 AM

## 2020-02-06 ENCOUNTER — Ambulatory Visit (INDEPENDENT_AMBULATORY_CARE_PROVIDER_SITE_OTHER): Payer: Self-pay | Admitting: Infectious Disease

## 2020-02-06 ENCOUNTER — Ambulatory Visit: Payer: Self-pay

## 2020-02-06 ENCOUNTER — Encounter: Payer: Self-pay | Admitting: Infectious Disease

## 2020-02-06 VITALS — BP 117/79 | HR 87 | Temp 98.6°F | Wt 147.0 lb

## 2020-02-06 DIAGNOSIS — B20 Human immunodeficiency virus [HIV] disease: Secondary | ICD-10-CM

## 2020-02-06 DIAGNOSIS — C180 Malignant neoplasm of cecum: Secondary | ICD-10-CM

## 2020-02-06 DIAGNOSIS — I2699 Other pulmonary embolism without acute cor pulmonale: Secondary | ICD-10-CM

## 2020-02-06 DIAGNOSIS — C787 Secondary malignant neoplasm of liver and intrahepatic bile duct: Secondary | ICD-10-CM

## 2020-02-06 NOTE — Progress Notes (Signed)
Subjective:  Chief complaint: she is certain that she is running out of Lovenox vials   Patient ID: Anita Hoffman, female    DOB: [birth year], 59 y.o.   MRN: [id number]  HPI  Anita Hoffman is a 59 year old African American woman recently diagnosed with HIV disease (with healthy CD4 count and not that high of a viral load) but also with metastatic colon cancer and pulmonary emboli.  She is seeing Dr. Marin Olp with Oncology and receiving chemotherapy with FOLFOX.  We did same day initiation with Biktarvy in the hospital which she has tolerated without any problems.   Her malignancy is responding to chemotherapy and she is following closely with Dr. Marin Olp.  She is on Lovenox but is running out of that occasion.  I asked her to talk to Dr. Marin Olp about this as I have not been prescribing it but I suspect she is going to need lifelong anticoagulation.  She is renewing her HMA P program today.   Past Medical History:  Diagnosis Date  . Former smoker   . Goals of care, counseling/discussion 12/06/2019  . HIV infection ([place])   . Iron deficiency anemia due to chronic blood loss 12/12/2019  . Medical history non-contributory     Past Surgical History:  Procedure Laterality Date  . BIOPSY  12/01/2019   Procedure: BIOPSY;  Surgeon: Lavena Bullion, DO;  Location: [facility];  Service: Gastroenterology;;  . COLONOSCOPY WITH PROPOFOL N/A 12/01/2019   Procedure: COLONOSCOPY WITH PROPOFOL;  Surgeon: Lavena Bullion, DO;  Location: [facility];  Service: Gastroenterology;  Laterality: N/A;  . HEMOSTASIS CLIP PLACEMENT  12/01/2019   Procedure: HEMOSTASIS CLIP PLACEMENT;  Surgeon: Lavena Bullion, DO;  Location: [facility];  Service: Gastroenterology;;  . IR IMAGING GUIDED PORT INSERTION  12/06/2019  . POLYPECTOMY  12/01/2019   Procedure: POLYPECTOMY;  Surgeon: Lavena Bullion, DO;  Location: [facility];  Service: Gastroenterology;;  . SUBMUCOSAL TATTOO INJECTION  12/01/2019   Procedure:  SUBMUCOSAL TATTOO INJECTION;  Surgeon: Lavena Bullion, DO;  Location: [facility];  Service: Gastroenterology;;  . TUBAL LIGATION      Family History  Problem Relation Age of Onset  . Ovarian cancer Maternal Grandmother   . Lung cancer Father       Social History   Socioeconomic History  . Marital status: Single    Spouse name: Not on file  . Number of children: 5  . Years of education: Not on file  . Highest education level: Not on file  Occupational History  . Not on file  Tobacco Use  . Smoking status: Former Smoker    Packs/day: 0.50    Types: Cigarettes    Quit date: 11/29/2003    Years since quitting: 16.2  . Smokeless tobacco: Never Used  Vaping Use  . Vaping Use: Never used  Substance and Sexual Activity  . Alcohol use: Not Currently    Comment: 1990  . Drug use: Yes    Types: Marijuana    Comment: 1990  . Sexual activity: Not Currently  Other Topics Concern  . Not on file  Social History Narrative  . Not on file   Social Determinants of Health   Financial Resource Strain:   . Difficulty of Paying Living Expenses:   Food Insecurity:   . Worried About Charity fundraiser in the Last Year:   . Arboriculturist in the Last Year:   Transportation Needs:   . Lack of Transportation (  Medical):   Marland Kitchen Lack of Transportation (Non-Medical):   Physical Activity:   . Days of Exercise per Week:   . Minutes of Exercise per Session:   Stress:   . Feeling of Stress :   Social Connections:   . Frequency of Communication with Friends and Family:   . Frequency of Social Gatherings with Friends and Family:   . Attends Religious Services:   . Active Member of Clubs or Organizations:   . Attends Archivist Meetings:   Marland Kitchen Marital Status:     No Known Allergies   Current Outpatient Medications:  .  bictegravir-emtricitabine-tenofovir AF (BIKTARVY) 50-200-25 MG TABS tablet, Take 1 tablet by mouth daily., Disp: 30 tablet, Rfl: 6 .  dexamethasone  (DECADRON) 4 MG tablet, Take 2 tablets (8 mg total) by mouth daily. Start the day after chemotherapy for 3 days. Take with food., Disp: 8 tablet, Rfl: 5 .  dronabinol (MARINOL) 2.5 MG capsule, Take 1 capsule (2.5 mg total) by mouth 2 (two) times daily before a meal., Disp: 60 capsule, Rfl: 1 .  lidocaine-prilocaine (EMLA) cream, Apply to affected area once (Patient taking differently: Apply 1 application topically as needed (port access). Apply to affected area once), Disp: 30 g, Rfl: 3 .  loperamide (IMODIUM A-D) 2 MG tablet, Take 2 at onset of diarrhea, then 1 every 2hrs until 12hr without a BM. May take 2 tab every 4hrs at bedtime. If diarrhea recurs repeat., Disp: 100 tablet, Rfl: 1 .  ondansetron (ZOFRAN) 8 MG tablet, Take 1 tablet (8 mg total) by mouth 2 (two) times daily as needed. Start on day 3 after chemotherapy., Disp: 30 tablet, Rfl: 1 .  potassium chloride SA (KLOR-CON) 20 MEQ tablet, Take 1 tablet (20 mEq total) by mouth daily., Disp: 30 tablet, Rfl: 1 .  enoxaparin (LOVENOX) 80 MG/0.8ML injection, Inject 0.8 mLs (80 mg total) into the skin 2 (two) times daily., Disp: 48 mL, Rfl: 0 .  folic acid (FOLVITE) 1 MG tablet, Take 2 tablets (2 mg total) by mouth daily. (Patient not taking: Reported on 02/06/2020), Disp: 30 tablet, Rfl: 1 .  LORazepam (ATIVAN) 0.5 MG tablet, Take 1 tablet (0.5 mg total) by mouth every 6 (six) hours as needed for anxiety. (Patient not taking: Reported on 02/06/2020), Disp: 30 tablet, Rfl: 0 .  pantoprazole (PROTONIX) 40 MG tablet, Take 1 tablet (40 mg total) by mouth 2 (two) times daily. (Patient not taking: Reported on 02/06/2020), Disp: 60 tablet, Rfl: 1 .  prochlorperazine (COMPAZINE) 10 MG tablet, Take 1 tablet (10 mg total) by mouth every 6 (six) hours as needed (Nausea or vomiting). (Patient not taking: Reported on 02/06/2020), Disp: 30 tablet, Rfl: 1  Review of Systems  Constitutional: Negative for activity change, appetite change, chills, diaphoresis, fatigue, fever  and unexpected weight change.  HENT: Negative for congestion, rhinorrhea, sinus pressure, sneezing, sore throat and trouble swallowing.   Eyes: Negative for photophobia and visual disturbance.  Respiratory: Negative for cough, chest tightness, shortness of breath, wheezing and stridor.   Cardiovascular: Negative for chest pain, palpitations and leg swelling.  Gastrointestinal: Negative for abdominal distention, abdominal pain, anal bleeding, blood in stool, constipation, diarrhea, nausea and vomiting.  Genitourinary: Negative for difficulty urinating, dysuria, flank pain and hematuria.  Musculoskeletal: Negative for arthralgias, back pain, gait problem, joint swelling and myalgias.  Skin: Negative for color change, pallor, rash and wound.  Neurological: Negative for dizziness, tremors, weakness and light-headedness.  Hematological: Negative for adenopathy. Does not bruise/bleed easily.  Psychiatric/Behavioral: Negative for agitation, behavioral problems, confusion, decreased concentration, dysphoric mood, self-injury and sleep disturbance.       Objective:   Physical Exam Constitutional:      General: She is not in acute distress.    Appearance: She is not diaphoretic.  HENT:     Head: Normocephalic and atraumatic.     Right Ear: External ear normal.     Left Ear: External ear normal.     Nose: Nose normal.     Mouth/Throat:     Pharynx: No oropharyngeal exudate.  Eyes:     General: No scleral icterus.    Conjunctiva/sclera: Conjunctivae normal.     Pupils: Pupils are equal, round, and reactive to light.  Cardiovascular:     Rate and Rhythm: Normal rate and regular rhythm.  Pulmonary:     Effort: Pulmonary effort is normal. No respiratory distress.     Breath sounds: No wheezing.  Abdominal:     General: Bowel sounds are normal.     Palpations: Abdomen is soft.     Tenderness: There is no abdominal tenderness. There is no rebound.  Musculoskeletal:        General: No  tenderness. Normal range of motion.     Cervical back: Normal range of motion and neck supple.  Lymphadenopathy:     Cervical: No cervical adenopathy.  Skin:    General: Skin is warm and dry.     Coloration: Skin is not pale.     Findings: No erythema or rash.  Neurological:     General: No focal deficit present.     Mental Status: She is alert and oriented to person, place, and time.     Coordination: Coordination normal.  Psychiatric:        Mood and Affect: Mood normal.        Behavior: Behavior normal.        Thought Content: Thought content normal.        Judgment: Judgment normal.     Port is clean dry and intact      Assessment & Plan:  HIV disease well-controlled we will check labs another 2 months when she comes back to visit   PEs: on anticoagulation I SUSPECT SHE NEEDS LIFELONG ANTICOAGULATION BUT I WOULD LIKE FOR DR ENNEVER TO BE MANAGING THIS  Metastatic colon cancer: on chemotherapy and responding

## 2020-02-07 ENCOUNTER — Other Ambulatory Visit: Payer: Self-pay

## 2020-02-07 ENCOUNTER — Other Ambulatory Visit: Payer: Self-pay | Admitting: Hematology & Oncology

## 2020-02-07 ENCOUNTER — Inpatient Hospital Stay: Payer: Self-pay

## 2020-02-07 VITALS — BP 127/89 | HR 78 | Temp 99.3°F | Resp 18

## 2020-02-07 DIAGNOSIS — D490 Neoplasm of unspecified behavior of digestive system: Secondary | ICD-10-CM

## 2020-02-07 DIAGNOSIS — D122 Benign neoplasm of ascending colon: Secondary | ICD-10-CM

## 2020-02-07 DIAGNOSIS — C787 Secondary malignant neoplasm of liver and intrahepatic bile duct: Secondary | ICD-10-CM

## 2020-02-07 MED ORDER — ENOXAPARIN SODIUM 80 MG/0.8ML ~~LOC~~ SOLN: 80.0000 mg | Freq: Two times a day (BID) | SUBCUTANEOUS | 6 refills | Status: DC

## 2020-02-07 MED ORDER — PEGFILGRASTIM-CBQV 6 MG/0.6ML ~~LOC~~ SOSY
6.0000 mg | PREFILLED_SYRINGE | Freq: Once | SUBCUTANEOUS | Status: AC
  Administered 2020-02-07: 6 mg via SUBCUTANEOUS

## 2020-02-07 MED ORDER — SODIUM CHLORIDE 0.9% FLUSH
10.0000 mL | INTRAVENOUS | Status: DC | PRN
  Administered 2020-02-07: 10 mL
  Filled 2020-02-07: qty 10

## 2020-02-07 MED ORDER — HEPARIN SOD (PORK) LOCK FLUSH 100 UNIT/ML IV SOLN
500.0000 [IU] | Freq: Once | INTRAVENOUS | Status: AC | PRN
  Administered 2020-02-07: 500 [IU]
  Filled 2020-02-07: qty 5

## 2020-02-07 MED ORDER — PEGFILGRASTIM-CBQV 6 MG/0.6ML ~~LOC~~ SOSY
PREFILLED_SYRINGE | SUBCUTANEOUS | Status: AC
  Filled 2020-02-07: qty 0.6

## 2020-02-07 NOTE — Patient Instructions (Signed)
Pegfilgrastim injection (Udenyca) °What is this medicine? °PEGFILGRASTIM (PEG fil gra stim) is a long-acting granulocyte colony-stimulating factor that stimulates the growth of neutrophils, a type of white blood cell important in the body's fight against infection. It is used to reduce the incidence of fever and infection in patients with certain types of cancer who are receiving chemotherapy that affects the bone marrow, and to increase survival after being exposed to high doses of radiation. °This medicine may be used for other purposes; ask your health care provider or pharmacist if you have questions. °COMMON BRAND NAME(S): Fulphila, Neulasta, UDENYCA, Ziextenzo °What should I tell my health care provider before I take this medicine? °They need to know if you have any of these conditions: °· kidney disease °· latex allergy °· ongoing radiation therapy °· sickle cell disease °· skin reactions to acrylic adhesives (On-Body Injector only) °· an unusual or allergic reaction to pegfilgrastim, filgrastim, other medicines, foods, dyes, or preservatives °· pregnant or trying to get pregnant °· breast-feeding °How should I use this medicine? °This medicine is for injection under the skin. If you get this medicine at home, you will be taught how to prepare and give the pre-filled syringe or how to use the On-body Injector. Refer to the patient Instructions for Use for detailed instructions. Use exactly as directed. Tell your healthcare provider immediately if you suspect that the On-body Injector may not have performed as intended or if you suspect the use of the On-body Injector resulted in a missed or partial dose. °It is important that you put your used needles and syringes in a special sharps container. Do not put them in a trash can. If you do not have a sharps container, call your pharmacist or healthcare provider to get one. °Talk to your pediatrician regarding the use of this medicine in children. While this drug  may be prescribed for selected conditions, precautions do apply. °Overdosage: If you think you have taken too much of this medicine contact a poison control center or emergency room at once. °NOTE: This medicine is only for you. Do not share this medicine with others. °What if I miss a dose? °It is important not to miss your dose. Call your doctor or health care professional if you miss your dose. If you miss a dose due to an On-body Injector failure or leakage, a new dose should be administered as soon as possible using a single prefilled syringe for manual use. °What may interact with this medicine? °Interactions have not been studied. °Give your health care provider a list of all the medicines, herbs, non-prescription drugs, or dietary supplements you use. Also tell them if you smoke, drink alcohol, or use illegal drugs. Some items may interact with your medicine. °This list may not describe all possible interactions. Give your health care provider a list of all the medicines, herbs, non-prescription drugs, or dietary supplements you use. Also tell them if you smoke, drink alcohol, or use illegal drugs. Some items may interact with your medicine. °What should I watch for while using this medicine? °You may need blood work done while you are taking this medicine. °If you are going to need a MRI, CT scan, or other procedure, tell your doctor that you are using this medicine (On-Body Injector only). °What side effects may I notice from receiving this medicine? °Side effects that you should report to your doctor or health care professional as soon as possible: °· allergic reactions like skin rash, itching or hives, swelling of   the face, lips, or tongue °· back pain °· dizziness °· fever °· pain, redness, or irritation at site where injected °· pinpoint red spots on the skin °· red or dark-brown urine °· shortness of breath or breathing problems °· stomach or side pain, or pain at the  shoulder °· swelling °· tiredness °· trouble passing urine or change in the amount of urine °Side effects that usually do not require medical attention (report to your doctor or health care professional if they continue or are bothersome): °· bone pain °· muscle pain °This list may not describe all possible side effects. Call your doctor for medical advice about side effects. You may report side effects to FDA at 1-800-FDA-1088. °Where should I keep my medicine? °Keep out of the reach of children. °If you are using this medicine at home, you will be instructed on how to store it. Throw away any unused medicine after the expiration date on the label. °NOTE: This sheet is a summary. It may not cover all possible information. If you have questions about this medicine, talk to your doctor, pharmacist, or health care provider. °© 2020 Elsevier/Gold Standard (2017-10-24 16:57:08) ° °

## 2020-02-08 ENCOUNTER — Ambulatory Visit (HOSPITAL_BASED_OUTPATIENT_CLINIC_OR_DEPARTMENT_OTHER): Payer: Self-pay

## 2020-02-08 ENCOUNTER — Telehealth: Payer: Self-pay | Admitting: Hematology & Oncology

## 2020-02-08 ENCOUNTER — Ambulatory Visit (HOSPITAL_BASED_OUTPATIENT_CLINIC_OR_DEPARTMENT_OTHER)
Admission: RE | Admit: 2020-02-08 | Discharge: 2020-02-08 | Disposition: A | Discharge status: Home / Self Care | Payer: Self-pay | Source: Ambulatory Visit | Attending: Family | Admitting: Family

## 2020-02-08 DIAGNOSIS — I2699 Other pulmonary embolism without acute cor pulmonale: Secondary | ICD-10-CM

## 2020-02-08 DIAGNOSIS — C787 Secondary malignant neoplasm of liver and intrahepatic bile duct: Secondary | ICD-10-CM

## 2020-02-08 DIAGNOSIS — C180 Malignant neoplasm of cecum: Secondary | ICD-10-CM

## 2020-02-08 NOTE — Telephone Encounter (Signed)
LM for patient regarding Lab & port flush appointment times due to having CT scans prior to MD appointment per Kathlene November in imaging

## 2020-02-11 ENCOUNTER — Other Ambulatory Visit: Payer: Self-pay | Admitting: *Deleted

## 2020-02-11 ENCOUNTER — Encounter: Payer: Self-pay | Admitting: *Deleted

## 2020-02-11 ENCOUNTER — Other Ambulatory Visit: Payer: Self-pay

## 2020-02-11 DIAGNOSIS — D122 Benign neoplasm of ascending colon: Secondary | ICD-10-CM

## 2020-02-11 MED ORDER — ENOXAPARIN SODIUM 80 MG/0.8ML ~~LOC~~ SOLN: 80.0000 mg | Freq: Two times a day (BID) | SUBCUTANEOUS | 0 refills | Status: DC

## 2020-02-11 MED FILL — ENOXAPARIN SODIUM 80 MG/0.8: 80 | 24 days supply | Qty: 38 | Fill #0

## 2020-02-11 NOTE — Progress Notes (Signed)
Called patient to confirm she had received message from scheduler regarding her upcoming appointments and scans. Patient has questions regarding her lovenox refill. Questions forwarded to the desk RN.  Oncology Nurse Navigator Documentation  Oncology Nurse Navigator Flowsheets 02/11/2020  Abnormal Finding Date -  Confirmed Diagnosis Date -  Diagnosis Status -  Planned Course of Treatment -  Phase of Treatment -  Chemotherapy Actual Start Date: -  Navigator Follow Up Date: 02/19/2020  Navigator Follow Up Reason: Follow-up Appointment;Chemotherapy  Navigator Restaurant manager, fast food Encounter Type Appt/Treatment Plan Review;Telephone  Telephone Appt Confirmation/Clarification;Outgoing Call  Treatment Initiated Date -  Patient Visit Type MedOnc  Treatment Phase Active Tx  Barriers/Navigation Needs -  Education -  Interventions Psycho-Social Support;Medication Assistance  Acuity Level 2-Minimal Needs (1-2 Barriers Identified)  Referrals -  Coordination of Care Other  Education Method -  Support Groups/Services Friends and Family  Time Spent with Patient 30

## 2020-02-12 ENCOUNTER — Telehealth: Payer: Self-pay | Admitting: *Deleted

## 2020-02-12 ENCOUNTER — Inpatient Hospital Stay: Payer: Self-pay | Admitting: Nutrition

## 2020-02-12 ENCOUNTER — Telehealth: Payer: Self-pay | Admitting: Nutrition

## 2020-02-12 NOTE — Telephone Encounter (Signed)
Call received from [facility] stating that the [employer] does not cover pt.'s Lovenox prescription.  Message sent to Otilio Carpen to attempt to obtain further financial assistance for pt.

## 2020-02-12 NOTE — Telephone Encounter (Signed)
Nutrition visit completed with patient over the telephone. 59 yo female diagnosed with metastatic Colorectal cancer. She is followed by Dr. Marin Olp. She is receiving FOLFOXIRI.  PMH includes Tobacco, new dx of HIV  Medications include Zofran, Protonix, Compazine, Ativan and Marinol.  Labs reviewed.  Height: 5'2". Weight: 146 pounds. UBW per patient 225 pounds. BMI: 26.7  Reports appetite is decreased. Patient reports she generally does not eat breakfast. She usually makes a sandwich for lunch and then eats dinner. She does not like milk or eggs. Seems to like many other protein foods. She has not tried [employer] yet but did not like Boost Plus. Denies Nausea, vomiting, constipation and diarrhea.  Nutrition Diagnosis: Unintended weight loss related to metastatic cancer and associated treatments as evidenced by 35% weight loss of Usual body weight (unknown timeframe)  Intervention:  Educated patient to consume small frequent meals and snacks consisting of high calories and protein. Reviewed examples of high protein foods. Encouraged patient to try different beverages to help increase fluid intake. Will mail fact sheets to home address. Questions answered. Teach back method used.  Monitoring, Evaluation, Goals: Patient will tolerate increased calories and protein for weight stabilization.  Next Visit: Patient will contact me with questions.

## 2020-02-18 ENCOUNTER — Ambulatory Visit: Payer: Self-pay

## 2020-02-19 ENCOUNTER — Encounter: Payer: Self-pay | Admitting: Hematology & Oncology

## 2020-02-19 ENCOUNTER — Telehealth: Payer: Self-pay | Admitting: *Deleted

## 2020-02-19 ENCOUNTER — Ambulatory Visit (HOSPITAL_BASED_OUTPATIENT_CLINIC_OR_DEPARTMENT_OTHER)
Admission: RE | Admit: 2020-02-19 | Discharge: 2020-02-19 | Disposition: A | Discharge status: Home / Self Care | Payer: Self-pay | Source: Ambulatory Visit | Attending: Family | Admitting: Family

## 2020-02-19 ENCOUNTER — Other Ambulatory Visit: Payer: Self-pay

## 2020-02-19 ENCOUNTER — Inpatient Hospital Stay: Payer: Self-pay

## 2020-02-19 ENCOUNTER — Encounter: Payer: Self-pay | Admitting: *Deleted

## 2020-02-19 ENCOUNTER — Inpatient Hospital Stay (HOSPITAL_BASED_OUTPATIENT_CLINIC_OR_DEPARTMENT_OTHER): Payer: Self-pay | Admitting: Hematology & Oncology

## 2020-02-19 VITALS — BP 118/86 | HR 104 | Temp 98.9°F | Resp 18 | Wt 143.0 lb

## 2020-02-19 DIAGNOSIS — D122 Benign neoplasm of ascending colon: Secondary | ICD-10-CM

## 2020-02-19 DIAGNOSIS — C787 Secondary malignant neoplasm of liver and intrahepatic bile duct: Secondary | ICD-10-CM

## 2020-02-19 DIAGNOSIS — E876 Hypokalemia: Secondary | ICD-10-CM

## 2020-02-19 DIAGNOSIS — I2699 Other pulmonary embolism without acute cor pulmonale: Secondary | ICD-10-CM

## 2020-02-19 DIAGNOSIS — C779 Secondary and unspecified malignant neoplasm of lymph node, unspecified: Secondary | ICD-10-CM

## 2020-02-19 DIAGNOSIS — D490 Neoplasm of unspecified behavior of digestive system: Secondary | ICD-10-CM

## 2020-02-19 DIAGNOSIS — C180 Malignant neoplasm of cecum: Secondary | ICD-10-CM | POA: Insufficient documentation

## 2020-02-19 DIAGNOSIS — D500 Iron deficiency anemia secondary to blood loss (chronic): Secondary | ICD-10-CM

## 2020-02-19 LAB — CMP (CANCER CENTER ONLY)
ALT: 12 U/L (ref 0–44)
AST: 22 U/L (ref 15–41)
Albumin: 3.6 g/dL (ref 3.5–5.0)
Alkaline Phosphatase: 101 U/L (ref 38–126)
Anion gap: 12 (ref 5–15)
BUN: 7 mg/dL (ref 6–20)
CO2: 29 mmol/L (ref 22–32)
Calcium: 8.1 mg/dL — ABNORMAL LOW (ref 8.9–10.3)
Chloride: 97 mmol/L — ABNORMAL LOW (ref 98–111)
Creatinine: 0.7 mg/dL (ref 0.44–1.00)
GFR, Est AFR Am: >60 mL/min (ref >60)
GFR, Estimated: >60 mL/min (ref >60)
Glucose, Bld: 106 mg/dL — ABNORMAL HIGH (ref 70–99)
Potassium: 2.7 mmol/L — CL (ref 3.5–5.1)
Sodium: 138 mmol/L (ref 135–145)
Total Bilirubin: 0.4 mg/dL (ref 0.3–1.2)
Total Protein: 6.5 g/dL (ref 6.5–8.1)

## 2020-02-19 LAB — CBC WITH DIFFERENTIAL (CANCER CENTER ONLY)
Abs Immature Granulocytes: 0.54 10*3/uL — ABNORMAL HIGH (ref 0.00–0.07)
Basophils Absolute: 0.1 10*3/uL (ref 0.0–0.1)
Basophils Relative: 0 %
Eosinophils Absolute: 0.1 10*3/uL (ref 0.0–0.5)
Eosinophils Relative: 0 %
HCT: 35.4 % — ABNORMAL LOW (ref 36.0–46.0)
Hemoglobin: 11.6 g/dL — ABNORMAL LOW (ref 12.0–15.0)
Immature Granulocytes: 4 %
Lymphocytes Relative: 32 %
Lymphs Abs: 4.3 10*3/uL — ABNORMAL HIGH (ref 0.7–4.0)
MCH: 29.2 pg (ref 26.0–34.0)
MCHC: 32.8 g/dL (ref 30.0–36.0)
MCV: 89.2 fL (ref 80.0–100.0)
Monocytes Absolute: 1.3 10*3/uL — ABNORMAL HIGH (ref 0.1–1.0)
Monocytes Relative: 10 %
Neutro Abs: 7.3 10*3/uL (ref 1.7–7.7)
Neutrophils Relative %: 54 %
Platelet Count: 257 10*3/uL (ref 150–400)
RBC: 3.97 MIL/uL (ref 3.87–5.11)
RDW: 20.7 % — ABNORMAL HIGH (ref 11.5–15.5)
WBC Count: 13.6 10*3/uL — ABNORMAL HIGH (ref 4.0–10.5)
nRBC: 0 % (ref 0.0–0.2)

## 2020-02-19 LAB — CEA (IN HOUSE-CHCC): CEA (CHCC-In House): 334.52 ng/mL — ABNORMAL HIGH (ref 0.00–5.00)

## 2020-02-19 MED ORDER — LEUCOVORIN CALCIUM INJECTION 350 MG
340.0000 mg | Freq: Once | INTRAVENOUS | Status: AC
  Administered 2020-02-19: 340 mg via INTRAVENOUS
  Filled 2020-02-19: qty 17

## 2020-02-19 MED ORDER — DEXTROSE 5 % IV SOLN
Freq: Once | INTRAVENOUS | Status: AC
  Filled 2020-02-19: qty 250

## 2020-02-19 MED ORDER — IOHEXOL 350 MG/ML SOLN
100.0000 mL | Freq: Once | INTRAVENOUS | Status: AC | PRN
  Administered 2020-02-19: 100 mL via INTRAVENOUS

## 2020-02-19 MED ORDER — ATROPINE SULFATE 1 MG/ML IJ SOLN
0.5000 mg | Freq: Once | INTRAMUSCULAR | Status: AC | PRN
  Administered 2020-02-19: 0.5 mg via INTRAVENOUS

## 2020-02-19 MED ORDER — ATROPINE SULFATE 1 MG/ML IJ SOLN
INTRAMUSCULAR | Status: AC
  Filled 2020-02-19: qty 1

## 2020-02-19 MED ORDER — POTASSIUM CHLORIDE CRYS ER 20 MEQ PO TBCR
40.0000 meq | EXTENDED_RELEASE_TABLET | Freq: Two times a day (BID) | ORAL | Status: DC
  Administered 2020-02-19 (×2): 40 meq via ORAL
  Filled 2020-02-19: qty 2

## 2020-02-19 MED ORDER — SODIUM CHLORIDE 0.9% FLUSH
10.0000 mL | INTRAVENOUS | Status: DC | PRN
  Administered 2020-02-19: 10 mL via INTRAVENOUS
  Filled 2020-02-19: qty 10

## 2020-02-19 MED ORDER — SODIUM CHLORIDE 0.9 % IV SOLN
150.0000 mg | Freq: Once | INTRAVENOUS | Status: AC
  Administered 2020-02-19: 150 mg via INTRAVENOUS
  Filled 2020-02-19: qty 5

## 2020-02-19 MED ORDER — PALONOSETRON HCL INJECTION 0.25 MG/5ML
INTRAVENOUS | Status: AC
  Filled 2020-02-19: qty 5

## 2020-02-19 MED ORDER — SODIUM CHLORIDE 0.9 % IV SOLN
5000.0000 mg | INTRAVENOUS | Status: DC
  Administered 2020-02-19: 5000 mg via INTRAVENOUS
  Filled 2020-02-19: qty 100

## 2020-02-19 MED ORDER — APIXABAN 5 MG PO TABS
5.0000 mg | ORAL_TABLET | Freq: Two times a day (BID) | ORAL | 6 refills | Status: DC
Start: 2020-02-19 — End: 2020-02-21

## 2020-02-19 MED ORDER — HEPARIN SOD (PORK) LOCK FLUSH 100 UNIT/ML IV SOLN
500.0000 [IU] | Freq: Once | INTRAVENOUS | Status: DC
  Filled 2020-02-19: qty 5

## 2020-02-19 MED ORDER — SODIUM CHLORIDE 0.9 % IV SOLN
240.0000 mg | Freq: Once | INTRAVENOUS | Status: AC
  Administered 2020-02-19: 240 mg via INTRAVENOUS
  Filled 2020-02-19: qty 5

## 2020-02-19 MED ORDER — OXALIPLATIN CHEMO INJECTION 100 MG/20ML
120.0000 mg | Freq: Once | INTRAVENOUS | Status: AC
  Administered 2020-02-19: 120 mg via INTRAVENOUS
  Filled 2020-02-19: qty 20

## 2020-02-19 MED ORDER — PALONOSETRON HCL INJECTION 0.25 MG/5ML
0.2500 mg | Freq: Once | INTRAVENOUS | Status: AC
  Administered 2020-02-19: 0.25 mg via INTRAVENOUS

## 2020-02-19 MED ORDER — POTASSIUM CHLORIDE CRYS ER 20 MEQ PO TBCR
40.0000 meq | EXTENDED_RELEASE_TABLET | Freq: Once | ORAL | Status: AC
  Administered 2020-02-19: 40 meq via ORAL
  Filled 2020-02-19: qty 2

## 2020-02-19 MED ORDER — SODIUM CHLORIDE 0.9% FLUSH
10.0000 mL | INTRAVENOUS | Status: DC | PRN
  Filled 2020-02-19: qty 10

## 2020-02-19 MED ORDER — SODIUM CHLORIDE 0.9 % IV SOLN
10.0000 mg | Freq: Once | INTRAVENOUS | Status: AC
  Administered 2020-02-19: 10 mg via INTRAVENOUS
  Filled 2020-02-19: qty 1

## 2020-02-19 MED ORDER — HEPARIN SOD (PORK) LOCK FLUSH 100 UNIT/ML IV SOLN
500.0000 [IU] | Freq: Once | INTRAVENOUS | Status: DC | PRN
  Filled 2020-02-19: qty 5

## 2020-02-19 NOTE — Progress Notes (Signed)
Hematology and Oncology Follow Up Visit  Anita Hoffman [id number] 1960-11-22 59 y.o. 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 daily IV iron as indicated  Udenyca sq post chemo   Interim History:  Anita Hoffman is here today for follow-up and treatment.  Thankfully, she is doing quite well.  We did do scans on her.  She had a CT angiogram of the chest.  There is no acute thrombus noted.  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  She is not having any problems with chest pain.  Is no shortness of breath.  Based on this, I will have her switch from Lovenox over to Eliquis.  I think this would be a reasonable option.  It would certainly make life easier for her and I think it would still be effective.  As far as her cancer is concerned, she has had a very nice response.  She had a decrease in her liver metastasis.  She also has had decrease in the primary noted in the cecum.  Her CEA has come down to 330.  She is eating okay.  Her weight is still coming down a little bit.  She has had no bleeding.  She has had no problems with bowels or bladder.  She has had no nausea or vomiting.  She has had no leg swelling.  We do have to get a Doppler of her left leg so we can see how the thrombus is doing in that left leg.  She has had no fever.  She has had no headache.  Overall, her performance status is ECOG 1.    Medications:  Allergies as of 02/19/2020   No Known Allergies     Medication List       Accurate as of February 19, 2020  6:11 PM. If you have any questions, ask your nurse or doctor.        apixaban 5 MG Tabs  tablet Commonly known as: ELIQUIS Take 1 tablet (5 mg total) by mouth 2 (two) times daily. Started by: Volanda Napoleon, MD   Biktarvy [phone] MG Tabs tablet Generic drug: bictegravir-emtricitabine-tenofovir AF Take 1 tablet by mouth daily.   dexamethasone 4 MG tablet Commonly known as: DECADRON Take 2 tablets (8 mg total) by mouth daily. Start the day after chemotherapy for 3 days. Take with food.   dronabinol 2.5 MG capsule Commonly known as: MARINOL Take 1 capsule (2.5 mg total) by mouth 2 (two) times daily before a meal.   enoxaparin 80 MG/0.8ML injection Commonly known as: LOVENOX Inject 0.8 mLs (80 mg total) into the skin 2 (two) times daily.   folic acid 1 MG tablet Commonly known as: FOLVITE Take 2 tablets (2 mg total) by mouth daily.   lidocaine-prilocaine cream Commonly known as: EMLA Apply to affected area once   loperamide 2 MG tablet Commonly known as: Imodium A-D Take 2 at onset of diarrhea, then 1 every 2hrs until 12hr without a BM. May take 2 tab every 4hrs at bedtime. If diarrhea recurs repeat.   LORazepam 0.5 MG tablet Commonly known as: Ativan Take 1 tablet (0.5 mg total) by mouth every 6 (six) hours as needed for anxiety.  ondansetron 8 MG tablet Commonly known as: Zofran Take 1 tablet (8 mg total) by mouth 2 (two) times daily as needed. Start on day 3 after chemotherapy.   pantoprazole 40 MG tablet Commonly known as: PROTONIX Take 1 tablet (40 mg total) by mouth 2 (two) times daily.   potassium chloride SA 20 MEQ tablet Commonly known as: KLOR-CON Take 1 tablet (20 mEq total) by mouth daily.   prochlorperazine 10 MG tablet Commonly known as: COMPAZINE Take 1 tablet (10 mg total) by mouth every 6 (six) hours as needed (Nausea or vomiting).       Allergies: No Known Allergies  Past Medical History, Surgical history, Social history, and Family History were reviewed and updated.  Review of Systems: Review of Systems  Constitutional:  Positive for weight loss.  HENT: Negative.   Eyes: Negative.   Respiratory: Negative.   Cardiovascular: Negative.   Gastrointestinal: Negative.   Genitourinary: Negative.   Musculoskeletal: Negative.   Skin: Negative.   Neurological: Negative.   Endo/Heme/Allergies: Negative.   Psychiatric/Behavioral: Negative.      Physical Exam:  weight is 143 lb (64.9 kg). Her oral temperature is 98.9 F (37.2 C). Her blood pressure is 118/86 and her pulse is 104 (abnormal). Her respiration is 18 and oxygen saturation is 96%.   Wt Readings from Last 3 Encounters:  02/19/20 143 lb (64.9 kg)  02/06/20 147 lb (66.7 kg)  02/05/20 146 lb (66.2 kg)    Physical Exam Vitals reviewed.  HENT:     Head: Normocephalic and atraumatic.  Eyes:     Pupils: Pupils are equal, round, and reactive to light.  Cardiovascular:     Rate and Rhythm: Normal rate and regular rhythm.     Heart sounds: Normal heart sounds.  Pulmonary:     Effort: Pulmonary effort is normal.     Breath sounds: Normal breath sounds.  Abdominal:     General: Bowel sounds are normal.     Palpations: Abdomen is soft.  Musculoskeletal:        General: No tenderness or deformity. Normal range of motion.     Cervical back: Normal range of motion.  Lymphadenopathy:     Cervical: No cervical adenopathy.  Skin:    General: Skin is warm and dry.     Findings: No erythema or rash.  Neurological:     Mental Status: She is alert and oriented to person, place, and time.  Psychiatric:        Behavior: Behavior normal.        Thought Content: Thought content normal.        Judgment: Judgment normal.      Lab Results  Component Value Date   WBC 13.6 (H) 02/19/2020   HGB 11.6 (L) 02/19/2020   HCT 35.4 (L) 02/19/2020   MCV 89.2 02/19/2020   PLT 257 02/19/2020   Lab Results  Component Value Date   FERRITIN 2,222 (H) 01/22/2020   IRON 103 01/22/2020   TIBC 196 (L) 01/22/2020   UIBC 93 (L) 01/22/2020   IRONPCTSAT 53 01/22/2020    Lab Results  Component Value Date   RBC 3.97 02/19/2020   No results found for: KPAFRELGTCHN, LAMBDASER, KAPLAMBRATIO No results found for: IGGSERUM, IGA, IGMSERUM No results found for: TOTALPROTELP, ALBUMINELP, A1GS, A2GS, BETS, BETA2SER, GAMS, MSPIKE, SPEI   Chemistry      Component Value Date/Time   NA 138 02/19/2020 0915   K 2.7 (LL) 02/19/2020 0915   CL 97 (L) 02/19/2020  0915   CO2 29 02/19/2020 0915   BUN 7 02/19/2020 0915   CREATININE 0.70 02/19/2020 0915   CREATININE 0.59 01/03/2020 1216      Component Value Date/Time   CALCIUM 8.1 (L) 02/19/2020 0915   ALKPHOS 101 02/19/2020 0915   AST 22 02/19/2020 0915   ALT 12 02/19/2020 0915   BILITOT 0.4 02/19/2020 0915       Impression and Plan: AnitaWilliamsis a very pleasant 59 year old African American with metastatic colon cancer primarily of the cecum with extensive liver metastasis.  We will refer started treatment, her CEA was 17,400.  Now it is down to 300.  As such, she has had a very nice response.  I am glad that the pulmonary emboli have improved.  Again we can switch over to Eliquis which will be a whole lot easier for her.  We will continue on with treatment.  I think she is doing pretty good with treatment.  Hopefully she will start again a little weight.  We are clearly doing with her quality of life.  This is clearly most important for [place].  We will plan to get her back in another 2 weeks.  She does get the G-CSF to help with her white cells.   Volanda Napoleon, MD 7/20/20216:11 PM

## 2020-02-19 NOTE — Progress Notes (Signed)
Oncology Nurse Navigator Documentation  Oncology Nurse Navigator Flowsheets 02/19/2020  Abnormal Finding Date -  Confirmed Diagnosis Date -  Diagnosis Status -  Planned Course of Treatment -  Phase of Treatment -  Chemotherapy Actual Start Date: -  Navigator Follow Up Date: 03/04/2020  Navigator Follow Up Reason: Follow-up Appointment;Chemotherapy  Navigator Location [facility]  Navigator Encounter Type Treatment;Appt/Treatment Plan Review  Telephone -  Treatment Initiated Date -  Patient Visit Type MedOnc  Treatment Phase Active Tx  Barriers/Navigation Needs No Barriers At This Time  Education -  Interventions Psycho-Social Support  Acuity Level 2-Minimal Needs (1-2 Barriers Identified)  Referrals -  Coordination of Care -  Education Method -  Support Groups/Services Friends and Family  Time Spent with Patient 15

## 2020-02-19 NOTE — Patient Instructions (Signed)
Implanted Port Insertion, Care After °This sheet gives you information about how to care for yourself after your procedure. Your health care provider may also give you more specific instructions. If you have problems or questions, contact your health care provider. °What can I expect after the procedure? °After the procedure, it is common to have: °· Discomfort at the port insertion site. °· Bruising on the skin over the port. This should improve over 3-4 days. °Follow these instructions at home: °Port care °· After your port is placed, you will get a manufacturer's information card. The card has information about your port. Keep this card with you at all times. °· Take care of the port as told by your health care provider. Ask your health care provider if you or a family member can get training for taking care of the port at home. A home health care nurse may also take care of the port. °· Make sure to remember what type of port you have. °Incision care ° °  ° °· Follow instructions from your health care provider about how to take care of your port insertion site. Make sure you: °? Wash your hands with soap and water before and after you change your bandage (dressing). If soap and water are not available, use hand sanitizer. °? Change your dressing as told by your health care provider. °? Leave stitches (sutures), skin glue, or adhesive strips in place. These skin closures may need to stay in place for 2 weeks or longer. If adhesive strip edges start to loosen and curl up, you may trim the loose edges. Do not remove adhesive strips completely unless your health care provider tells you to do that. °· Check your port insertion site every day for signs of infection. Check for: °? Redness, swelling, or pain. °? Fluid or blood. °? Warmth. °? Pus or a bad smell. °Activity °· Return to your normal activities as told by your health care provider. Ask your health care provider what activities are safe for you. °· Do not  lift anything that is heavier than 10 lb (4.5 kg), or the limit that you are told, until your health care provider says that it is safe. °General instructions °· Take over-the-counter and prescription medicines only as told by your health care provider. °· Do not take baths, swim, or use a hot tub until your health care provider approves. Ask your health care provider if you may take showers. You may only be allowed to take sponge baths. °· Do not drive for 24 hours if you were given a sedative during your procedure. °· Wear a medical alert bracelet in case of an emergency. This will tell any health care providers that you have a port. °· Keep all follow-up visits as told by your health care provider. This is important. °Contact a health care provider if: °· You cannot flush your port with saline as directed, or you cannot draw blood from the port. °· You have a fever or chills. °· You have redness, swelling, or pain around your port insertion site. °· You have fluid or blood coming from your port insertion site. °· Your port insertion site feels warm to the touch. °· You have pus or a bad smell coming from the port insertion site. °Get help right away if: °· You have chest pain or shortness of breath. °· You have bleeding from your port that you cannot control. °Summary °· Take care of the port as told by your health   care provider. Keep the manufacturer's information card with you at all times. °· Change your dressing as told by your health care provider. °· Contact a health care provider if you have a fever or chills or if you have redness, swelling, or pain around your port insertion site. °· Keep all follow-up visits as told by your health care provider. °This information is not intended to replace advice given to you by your health care provider. Make sure you discuss any questions you have with your health care provider. °Document Revised: 02/14/2018 Document Reviewed: 02/14/2018 °Elsevier Patient Education ©  2020 [employer] ° °

## 2020-02-19 NOTE — Progress Notes (Signed)
Patient has had weight decrease. Doses will be decreased to reflect patient's current BSA. Doses changed per Dr. Antonieta Pert instructions.

## 2020-02-19 NOTE — Telephone Encounter (Signed)
Dr. Marin Olp notified of potassium-2.7.  Order received for pt to take a total of 120 meq of oral potassium today.  Pt is to take 40 meq potassium po now, 40 meq po in two hours and 40 meq prior to discharge today per order of Dr. Marin Olp.

## 2020-02-19 NOTE — Patient Instructions (Signed)
Dexamethasone eye drops What is this medicine? DEXAMETHASONE (dex a METH a sone) is a corticosteroid. It is used to treat swelling, redness, itching, and allergic reactions in the eye. This medicine may be used for other purposes; ask your health care provider or pharmacist if you have questions. COMMON BRAND NAME(S): AK-Dex, Decadron, Maxidex What should I tell my health care provider before I take this medicine? They need to know if you have any of these conditions:  an active eye infection  cataracts  glaucoma  contact lens wearer  an unusual or allergic reaction to dexamethasone, corticosteroids, other medicines, foods, dyes, or preservatives  pregnant or trying to get pregnant  breast-feeding How should I use this medicine? This medicine is only for use in the eye. Do not take by mouth. Follow the directions on the prescription label. Wash hands before and after use. Tilt your head back slightly and pull the lower eyelid away from the eye to form a pouch. Try not to touch the tip of the dropper to your eye, fingertips, or other surface. Squeeze the prescribed number of drops into the pouch. Close the eye for a few moments to spread the drops. Do not use more often than directed. Talk to your pediatrician regarding the use of this medicine in children. Special care may be needed. Overdosage: If you think you have taken too much of this medicine contact a poison control center or emergency room at once. NOTE: This medicine is only for you. Do not share this medicine with others. What if I miss a dose? If you miss a dose, use it as soon as you can. If it is almost time for your next dose, use only that dose. Do not use double or extra doses. What may interact with this medicine? Interactions are not expected. Do not use any other eye products without asking your doctor or health care professional. This list may not describe all possible interactions. Give your health care provider a  list of all the medicines, herbs, non-prescription drugs, or dietary supplements you use. Also tell them if you smoke, drink alcohol, or use illegal drugs. Some items may interact with your medicine. What should I watch for while using this medicine? Tell your health care professional if your symptoms do not start to get better or if they get worse. Have your eyes checked as directed. If you wear contact lenses, ask your doctor or health care professional when you can wear your lenses again. If you continue wearing your lenses during treatment, wait 15 minutes after the application of the product before inserting your lenses again. What side effects may I notice from receiving this medicine? Side effects that you should report to your doctor or health care professional as soon as possible:  allergic reactions like skin rash, itching or hives, swelling of the face, lips, or tongue  changes in vision  eye pain, swelling, or redness Side effects that usually do not require medical attention (report to your doctor or health care professional if they continue or are bothersome):  burning, discomfort, stinging when applied This list may not describe all possible side effects. Call your doctor for medical advice about side effects. You may report side effects to FDA at 1-800-FDA-1088. Where should I keep my medicine? Keep out of the reach of children. Store at room temperature between 15 and 30 degrees C (59 and 86 degrees F). Do not freeze. Throw away any unused medicine after the expiration date. NOTE: This sheet  is a summary. It may not cover all possible information. If you have questions about this medicine, talk to your doctor, pharmacist, or health care provider.  2020 Elsevier/Gold Standard (2019-02-01 14:50:53) Aprepitant capsules What is this medicine? APREPITANT (ap RE pi tant) is used with other medicines to prevent nausea and vomiting caused by cancer treatment (chemotherapy). This  medicine may be used for other purposes; ask your health care provider or pharmacist if you have questions. COMMON BRAND NAME(S): Emend What should I tell my health care provider before I take this medicine? They need to know if you have any of these conditions:  liver disease  an unusual or allergic reaction to aprepitant, fosaprepitant, other medicines, foods, dyes, or preservatives  pregnant or trying to get pregnant  breast-feeding How should I use this medicine? Take this medicine by mouth with a glass of water. Follow the directions on the prescription label. Usually, you will take your first dose one hour before your chemotherapy begins, and then once daily in the morning for the next 2 days after your chemotherapy treatment. This medicine may be taken with or without food. Do not take more often than directed. Talk to your pediatrician regarding the use of this medicine in children. While this drug may be prescribed for children as young as 12 years for selected conditions, precautions do apply. Overdosage: If you think you have taken too much of this medicine contact a poison control center or emergency room at once. NOTE: This medicine is only for you. Do not share this medicine with others. What if I miss a dose? If you miss a dose, take it as soon as you can. If it is almost time for your next dose, take only that dose. Do not take double or extra doses. What may interact with this medicine? Do not take this medicine with any of these medicines:  cisapride  flibanserin  lomitapide  pimozide This medicine may also interact with the following medications:  diltiazem  female hormones, like estrogens or progestins and birth control pills  medicines for fungal infections like ketoconazole and itraconazole  medicines for HIV  medicines for seizures or to control epilepsy like carbamazepine or phenytoin  medicines used for sleep or anxiety disorders like alprazolam,  diazepam, or midazolam  nefazodone  paroxetine  ranolazine  rifampin  some chemotherapy medications like etoposide, ifosfamide, vinblastine, vincristine  some antibiotics like clarithromycin, erythromycin, troleandomycin  steroid medicines like dexamethasone or methylprednisolone  tolbutamide  warfarin This list may not describe all possible interactions. Give your health care provider a list of all the medicines, herbs, non-prescription drugs, or dietary supplements you use. Also tell them if you smoke, drink alcohol, or use illegal drugs. Some items may interact with your medicine. What should I watch for while using this medicine? Do not take this medicine if you already have nausea and vomiting. Ask your health care provider what to do if you already have nausea. Birth control pills and other methods of hormonal contraception (for example, IUD or patch) may not work properly while you are taking this medicine. Use an extra method of birth control during treatment and for 1 month after your last dose of aprepitant. This medicine should not be used continuously for a long time. Visit your doctor or health care professional for regular check-ups. This medicine may change your liver function blood test results. What side effects may I notice from receiving this medicine? Side effects that you should report to your doctor or  health care professional as soon as possible:  allergic reactions like skin rash, itching or hives, swelling of the face, lips, or tongue  breathing problems  changes in heart rhythm  high or low blood pressure  rectal bleeding  serious dizziness or disorientation, confusion  sharp or severe stomach pain  sharp pain in your leg Side effects that usually do not require medical attention (report to your doctor or health care professional if they continue or are bothersome):  constipation or diarrhea  hair loss  headache  hiccups  loss of  appetite  nausea  upset stomach  tiredness This list may not describe all possible side effects. Call your doctor for medical advice about side effects. You may report side effects to FDA at 1-800-FDA-1088. Where should I keep my medicine? Keep out of the reach of children. Store at room temperature between 20 and 25 degrees C (68 and 77 degrees F). Throw away any unused medicine after the expiration date. NOTE: This sheet is a summary. It may not cover all possible information. If you have questions about this medicine, talk to your doctor, pharmacist, or health care provider.  2020 Elsevier/Gold Standard (2018-04-28 13:36:30) Palonosetron Injection What is this medicine? PALONOSETRON (pal oh NOE se tron) is used to prevent nausea and vomiting caused by chemotherapy. It also helps prevent delayed nausea and vomiting that may occur a few days after your treatment. This medicine may be used for other purposes; ask your health care provider or pharmacist if you have questions. COMMON BRAND NAME(S): Aloxi What should I tell my health care provider before I take this medicine? They need to know if you have any of these conditions:  an unusual or allergic reaction to palonosetron, dolasetron, granisetron, ondansetron, other medicines, foods, dyes, or preservatives  pregnant or trying to get pregnant  breast-feeding How should I use this medicine? This medicine is for infusion into a vein. It is given by a health care professional in a hospital or clinic setting. Talk to your pediatrician regarding the use of this medicine in children. While this drug may be prescribed for children as young as 1 month for selected conditions, precautions do apply. Overdosage: If you think you have taken too much of this medicine contact a poison control center or emergency room at once. NOTE: This medicine is only for you. Do not share this medicine with others. What if I miss a dose? This does not  apply. What may interact with this medicine?  certain medicines for depression, anxiety, or psychotic disturbances  fentanyl  linezolid  MAOIs like Carbex, Eldepryl, Marplan, Nardil, and Parnate  methylene blue (injected into a vein)  tramadol This list may not describe all possible interactions. Give your health care provider a list of all the medicines, herbs, non-prescription drugs, or dietary supplements you use. Also tell them if you smoke, drink alcohol, or use illegal drugs. Some items may interact with your medicine. What should I watch for while using this medicine? Your condition will be monitored carefully while you are receiving this medicine. What side effects may I notice from receiving this medicine? Side effects that you should report to your doctor or health care professional as soon as possible:  allergic reactions like skin rash, itching or hives, swelling of the face, lips, or tongue  breathing problems  confusion  dizziness  fast, irregular heartbeat  fever and chills  loss of balance or coordination  seizures  sweating  swelling of the hands and feet  tremors  unusually weak or tired Side effects that usually do not require medical attention (report to your doctor or health care professional if they continue or are bothersome):  constipation or diarrhea  headache This list may not describe all possible side effects. Call your doctor for medical advice about side effects. You may report side effects to FDA at 1-800-FDA-1088. Where should I keep my medicine? This drug is given in a hospital or clinic and will not be stored at home. NOTE: This sheet is a summary. It may not cover all possible information. If you have questions about this medicine, talk to your doctor, pharmacist, or health care provider.  2020 Elsevier/Gold Standard (2013-05-25 10:38:36) Leucovorin injection What is this medicine? LEUCOVORIN (loo koe VOR in) is used to  prevent or treat the harmful effects of some medicines. This medicine is used to treat anemia caused by a low amount of folic acid in the body. It is also used with 5-fluorouracil (5-FU) to treat colon cancer. This medicine may be used for other purposes; ask your health care provider or pharmacist if you have questions. What should I tell my health care provider before I take this medicine? They need to know if you have any of these conditions:  anemia from low levels of vitamin B-12 in the blood  an unusual or allergic reaction to leucovorin, folic acid, other medicines, foods, dyes, or preservatives  pregnant or trying to get pregnant  breast-feeding How should I use this medicine? This medicine is for injection into a muscle or into a vein. It is given by a health care professional in a hospital or clinic setting. Talk to your pediatrician regarding the use of this medicine in children. Special care may be needed. Overdosage: If you think you have taken too much of this medicine contact a poison control center or emergency room at once. NOTE: This medicine is only for you. Do not share this medicine with others. What if I miss a dose? This does not apply. What may interact with this medicine?  capecitabine  fluorouracil  phenobarbital  phenytoin  primidone  trimethoprim-sulfamethoxazole This list may not describe all possible interactions. Give your health care provider a list of all the medicines, herbs, non-prescription drugs, or dietary supplements you use. Also tell them if you smoke, drink alcohol, or use illegal drugs. Some items may interact with your medicine. What should I watch for while using this medicine? Your condition will be monitored carefully while you are receiving this medicine. This medicine may increase the side effects of 5-fluorouracil, 5-FU. Tell your doctor or health care professional if you have diarrhea or mouth sores that do not get better or that  get worse. What side effects may I notice from receiving this medicine? Side effects that you should report to your doctor or health care professional as soon as possible:  allergic reactions like skin rash, itching or hives, swelling of the face, lips, or tongue  breathing problems  fever, infection  mouth sores  unusual bleeding or bruising  unusually weak or tired Side effects that usually do not require medical attention (report to your doctor or health care professional if they continue or are bothersome):  constipation or diarrhea  loss of appetite  nausea, vomiting This list may not describe all possible side effects. Call your doctor for medical advice about side effects. You may report side effects to FDA at 1-800-FDA-1088. Where should I keep my medicine? This drug is given in a  hospital or clinic and will not be stored at home. NOTE: This sheet is a summary. It may not cover all possible information. If you have questions about this medicine, talk to your doctor, pharmacist, or health care provider.  2020 Elsevier/Gold Standard (2008-01-23 16:50:29) Irinotecan injection What is this medicine? IRINOTECAN (ir in oh TEE kan ) is a chemotherapy drug. It is used to treat colon and rectal cancer. This medicine may be used for other purposes; ask your health care provider or pharmacist if you have questions. COMMON BRAND NAME(S): Camptosar What should I tell my health care provider before I take this medicine? They need to know if you have any of these conditions:  dehydration  diarrhea  infection (especially a virus infection such as chickenpox, cold sores, or herpes)  liver disease  low blood counts, like low white cell, platelet, or red cell counts  low levels of calcium, magnesium, or potassium in the blood  recent or ongoing radiation therapy  an unusual or allergic reaction to irinotecan, other medicines, foods, dyes, or preservatives  pregnant or trying  to get pregnant  breast-feeding How should I use this medicine? This drug is given as an infusion into a vein. It is administered in a hospital or clinic by a specially trained health care professional. Talk to your pediatrician regarding the use of this medicine in children. Special care may be needed. Overdosage: If you think you have taken too much of this medicine contact a poison control center or emergency room at once. NOTE: This medicine is only for you. Do not share this medicine with others. What if I miss a dose? It is important not to miss your dose. Call your doctor or health care professional if you are unable to keep an appointment. What may interact with this medicine? This medicine may interact with the following medications:  antiviral medicines for HIV or AIDS  certain antibiotics like rifampin or rifabutin  certain medicines for fungal infections like itraconazole, ketoconazole, posaconazole, and voriconazole  certain medicines for seizures like carbamazepine, phenobarbital, phenotoin  clarithromycin  gemfibrozil  nefazodone  St. John's Wort This list may not describe all possible interactions. Give your health care provider a list of all the medicines, herbs, non-prescription drugs, or dietary supplements you use. Also tell them if you smoke, drink alcohol, or use illegal drugs. Some items may interact with your medicine. What should I watch for while using this medicine? Your condition will be monitored carefully while you are receiving this medicine. You will need important blood work done while you are taking this medicine. This drug may make you feel generally unwell. This is not uncommon, as chemotherapy can affect healthy cells as well as cancer cells. Report any side effects. Continue your course of treatment even though you feel ill unless your doctor tells you to stop. In some cases, you may be given additional medicines to help with side effects. Follow  all directions for their use. You may get drowsy or dizzy. Do not drive, use machinery, or do anything that needs mental alertness until you know how this medicine affects you. Do not stand or sit up quickly, especially if you are an older patient. This reduces the risk of dizzy or fainting spells. Call your health care professional for advice if you get a fever, chills, or sore throat, or other symptoms of a cold or flu. Do not treat yourself. This medicine decreases your body's ability to fight infections. Try to avoid being around  people who are sick. Avoid taking products that contain aspirin, acetaminophen, ibuprofen, naproxen, or ketoprofen unless instructed by your doctor. These medicines may hide a fever. This medicine may increase your risk to bruise or bleed. Call your doctor or health care professional if you notice any unusual bleeding. Be careful brushing and flossing your teeth or using a toothpick because you may get an infection or bleed more easily. If you have any dental work done, tell your dentist you are receiving this medicine. Do not become pregnant while taking this medicine or for 6 months after stopping it. Women should inform their health care professional if they wish to become pregnant or think they might be pregnant. Men should not father a child while taking this medicine and for 3 months after stopping it. There is potential for serious side effects to an unborn child. Talk to your health care professional for more information. Do not breast-feed an infant while taking this medicine or for 7 days after stopping it. This medicine has caused ovarian failure in some women. This medicine may make it more difficult to get pregnant. Talk to your health care professional if you are concerned about your fertility. This medicine has caused decreased sperm counts in some men. This may make it more difficult to father a child. Talk to your health care professional if you are concerned  about your fertility. What side effects may I notice from receiving this medicine? Side effects that you should report to your doctor or health care professional as soon as possible:  allergic reactions like skin rash, itching or hives, swelling of the face, lips, or tongue  chest pain  diarrhea  flushing, runny nose, sweating during infusion  low blood counts - this medicine may decrease the number of white blood cells, red blood cells and platelets. You may be at increased risk for infections and bleeding.  nausea, vomiting  pain, swelling, warmth in the leg  signs of decreased platelets or bleeding - bruising, pinpoint red spots on the skin, black, tarry stools, blood in the urine  signs of infection - fever or chills, cough, sore throat, pain or difficulty passing urine  signs of decreased red blood cells - unusually weak or tired, fainting spells, lightheadedness Side effects that usually do not require medical attention (report to your doctor or health care professional if they continue or are bothersome):  constipation  hair loss  headache  loss of appetite  mouth sores  stomach pain This list may not describe all possible side effects. Call your doctor for medical advice about side effects. You may report side effects to FDA at 1-800-FDA-1088. Where should I keep my medicine? This drug is given in a hospital or clinic and will not be stored at home. NOTE: This sheet is a summary. It may not cover all possible information. If you have questions about this medicine, talk to your doctor, pharmacist, or health care provider.  2020 Elsevier/Gold Standard (2018-09-08 10:09:17) Oxaliplatin Injection What is this medicine? OXALIPLATIN (ox AL i PLA tin) is a chemotherapy drug. It targets fast dividing cells, like cancer cells, and causes these cells to die. This medicine is used to treat cancers of the colon and rectum, and many other cancers. This medicine may be used for  other purposes; ask your health care provider or pharmacist if you have questions. COMMON BRAND NAME(S): Eloxatin What should I tell my health care provider before I take this medicine? They need to know if you have  any of these conditions:  heart disease  history of irregular heartbeat  liver disease  low blood counts, like white cells, platelets, or red blood cells  lung or breathing disease, like asthma  take medicines that treat or prevent blood clots  tingling of the fingers or toes, or other nerve disorder  an unusual or allergic reaction to oxaliplatin, other chemotherapy, other medicines, foods, dyes, or preservatives  pregnant or trying to get pregnant  breast-feeding How should I use this medicine? This drug is given as an infusion into a vein. It is administered in a hospital or clinic by a specially trained health care professional. Talk to your pediatrician regarding the use of this medicine in children. Special care may be needed. Overdosage: If you think you have taken too much of this medicine contact a poison control center or emergency room at once. NOTE: This medicine is only for you. Do not share this medicine with others. What if I miss a dose? It is important not to miss a dose. Call your doctor or health care professional if you are unable to keep an appointment. What may interact with this medicine? Do not take this medicine with any of the following medications:  cisapride  dronedarone  pimozide  thioridazine This medicine may also interact with the following medications:  aspirin and aspirin-like medicines  certain medicines that treat or prevent blood clots like warfarin, apixaban, dabigatran, and rivaroxaban  cisplatin  cyclosporine  diuretics  medicines for infection like acyclovir, adefovir, amphotericin B, bacitracin, cidofovir, foscarnet, ganciclovir, gentamicin, pentamidine, vancomycin  NSAIDs, medicines for pain and  inflammation, like ibuprofen or naproxen  other medicines that prolong the QT interval (an abnormal heart rhythm)  pamidronate  zoledronic acid This list may not describe all possible interactions. Give your health care provider a list of all the medicines, herbs, non-prescription drugs, or dietary supplements you use. Also tell them if you smoke, drink alcohol, or use illegal drugs. Some items may interact with your medicine. What should I watch for while using this medicine? Your condition will be monitored carefully while you are receiving this medicine. You may need blood work done while you are taking this medicine. This medicine may make you feel generally unwell. This is not uncommon as chemotherapy can affect healthy cells as well as cancer cells. Report any side effects. Continue your course of treatment even though you feel ill unless your healthcare professional tells you to stop. This medicine can make you more sensitive to cold. Do not drink cold drinks or use ice. Cover exposed skin before coming in contact with cold temperatures or cold objects. When out in cold weather wear warm clothing and cover your mouth and nose to warm the air that goes into your lungs. Tell your doctor if you get sensitive to the cold. Do not become pregnant while taking this medicine or for 9 months after stopping it. Women should inform their health care professional if they wish to become pregnant or think they might be pregnant. Men should not father a child while taking this medicine and for 6 months after stopping it. There is potential for serious side effects to an unborn child. Talk to your health care professional for more information. Do not breast-feed a child while taking this medicine or for 3 months after stopping it. This medicine has caused ovarian failure in some women. This medicine may make it more difficult to get pregnant. Talk to your health care professional if you are concerned  about  your fertility. This medicine has caused decreased sperm counts in some men. This may make it more difficult to father a child. Talk to your health care professional if you are concerned about your fertility. This medicine may increase your risk of getting an infection. Call your health care professional for advice if you get a fever, chills, or sore throat, or other symptoms of a cold or flu. Do not treat yourself. Try to avoid being around people who are sick. Avoid taking medicines that contain aspirin, acetaminophen, ibuprofen, naproxen, or ketoprofen unless instructed by your health care professional. These medicines may hide a fever. Be careful brushing or flossing your teeth or using a toothpick because you may get an infection or bleed more easily. If you have any dental work done, tell your dentist you are receiving this medicine. What side effects may I notice from receiving this medicine? Side effects that you should report to your doctor or health care professional as soon as possible:  allergic reactions like skin rash, itching or hives, swelling of the face, lips, or tongue  breathing problems  cough  low blood counts - this medicine may decrease the number of white blood cells, red blood cells, and platelets. You may be at increased risk for infections and bleeding  nausea, vomiting  pain, redness, or irritation at site where injected  pain, tingling, numbness in the hands or feet  signs and symptoms of bleeding such as bloody or black, tarry stools; red or dark brown urine; spitting up blood or brown material that looks like coffee grounds; red spots on the skin; unusual bruising or bleeding from the eyes, gums, or nose  signs and symptoms of a dangerous change in heartbeat or heart rhythm like chest pain; dizziness; fast, irregular heartbeat; palpitations; feeling faint or lightheaded; falls  signs and symptoms of infection like fever; chills; cough; sore throat; pain or  trouble passing urine  signs and symptoms of liver injury like dark yellow or brown urine; general ill feeling or flu-like symptoms; light-colored stools; loss of appetite; nausea; right upper belly pain; unusually weak or tired; yellowing of the eyes or skin  signs and symptoms of low red blood cells or anemia such as unusually weak or tired; feeling faint or lightheaded; falls  signs and symptoms of muscle injury like dark urine; trouble passing urine or change in the amount of urine; unusually weak or tired; muscle pain; back pain Side effects that usually do not require medical attention (report to your doctor or health care professional if they continue or are bothersome):  changes in taste  diarrhea  gas  hair loss  loss of appetite  mouth sores This list may not describe all possible side effects. Call your doctor for medical advice about side effects. You may report side effects to FDA at 1-800-FDA-1088. Where should I keep my medicine? This drug is given in a hospital or clinic and will not be stored at home. NOTE: This sheet is a summary. It may not cover all possible information. If you have questions about this medicine, talk to your doctor, pharmacist, or health care provider.  2020 Elsevier/Gold Standard (2018-12-06 12:20:35)

## 2020-02-21 ENCOUNTER — Inpatient Hospital Stay: Payer: Self-pay

## 2020-02-21 ENCOUNTER — Ambulatory Visit (HOSPITAL_BASED_OUTPATIENT_CLINIC_OR_DEPARTMENT_OTHER): Admission: RE | Admit: 2020-02-21 | Payer: Self-pay | Source: Ambulatory Visit

## 2020-02-21 ENCOUNTER — Other Ambulatory Visit: Payer: Self-pay | Admitting: *Deleted

## 2020-02-21 ENCOUNTER — Ambulatory Visit (HOSPITAL_BASED_OUTPATIENT_CLINIC_OR_DEPARTMENT_OTHER): Payer: Self-pay

## 2020-02-21 VITALS — BP 121/89 | HR 86 | Temp 98.7°F | Resp 18

## 2020-02-21 DIAGNOSIS — D490 Neoplasm of unspecified behavior of digestive system: Secondary | ICD-10-CM

## 2020-02-21 DIAGNOSIS — C787 Secondary malignant neoplasm of liver and intrahepatic bile duct: Secondary | ICD-10-CM

## 2020-02-21 DIAGNOSIS — D122 Benign neoplasm of ascending colon: Secondary | ICD-10-CM

## 2020-02-21 MED ORDER — SODIUM CHLORIDE 0.9% FLUSH
10.0000 mL | INTRAVENOUS | Status: DC | PRN
  Administered 2020-02-21: 10 mL
  Filled 2020-02-21: qty 10

## 2020-02-21 MED ORDER — PEGFILGRASTIM-CBQV 6 MG/0.6ML ~~LOC~~ SOSY
PREFILLED_SYRINGE | SUBCUTANEOUS | Status: AC
  Filled 2020-02-21: qty 0.6

## 2020-02-21 MED ORDER — APIXABAN 5 MG PO TABS: 5.0000 mg | ORAL_TABLET | Freq: Two times a day (BID) | ORAL | 11 refills | Status: DC

## 2020-02-21 MED ORDER — PEGFILGRASTIM-CBQV 6 MG/0.6ML ~~LOC~~ SOSY
6.0000 mg | PREFILLED_SYRINGE | Freq: Once | SUBCUTANEOUS | Status: AC
  Administered 2020-02-21: 6 mg via SUBCUTANEOUS

## 2020-02-21 MED ORDER — HEPARIN SOD (PORK) LOCK FLUSH 100 UNIT/ML IV SOLN
500.0000 [IU] | Freq: Once | INTRAVENOUS | Status: AC | PRN
  Administered 2020-02-21: 500 [IU]
  Filled 2020-02-21: qty 5

## 2020-02-21 NOTE — Patient Instructions (Signed)
RESUME LOVENOX (BLOOD THINNER SHOTS) UNTIL YOU CAN GET ELIQUIS (BLOOD THINNER PILLS) FILLED!     Fluorouracil, 5-FU injection What is this medicine? FLUOROURACIL, 5-FU (flure oh YOOR a sil) is a chemotherapy drug. It slows the growth of cancer cells. This medicine is used to treat many types of cancer like breast cancer, colon or rectal cancer, pancreatic cancer, and stomach cancer. This medicine may be used for other purposes; ask your health care provider or pharmacist if you have questions. COMMON BRAND NAME(S): Adrucil What should I tell my health care provider before I take this medicine? They need to know if you have any of these conditions:  blood disorders  dihydropyrimidine dehydrogenase (DPD) deficiency  infection (especially a virus infection such as chickenpox, cold sores, or herpes)  kidney disease  liver disease  malnourished, poor nutrition  recent or ongoing radiation therapy  an unusual or allergic reaction to fluorouracil, other chemotherapy, other medicines, foods, dyes, or preservatives  pregnant or trying to get pregnant  breast-feeding How should I use this medicine? This drug is given as an infusion or injection into a vein. It is administered in a hospital or clinic by a specially trained health care professional. Talk to your pediatrician regarding the use of this medicine in children. Special care may be needed. Overdosage: If you think you have taken too much of this medicine contact a poison control center or emergency room at once. NOTE: This medicine is only for you. Do not share this medicine with others. What if I miss a dose? It is important not to miss your dose. Call your doctor or health care professional if you are unable to keep an appointment. What may interact with this medicine?  allopurinol  cimetidine  dapsone  digoxin  hydroxyurea  leucovorin  levamisole  medicines for seizures like ethotoin, fosphenytoin,  phenytoin  medicines to increase blood counts like filgrastim, pegfilgrastim, sargramostim  medicines that treat or prevent blood clots like warfarin, enoxaparin, and dalteparin  methotrexate  metronidazole  pyrimethamine  some other chemotherapy drugs like busulfan, cisplatin, estramustine, vinblastine  trimethoprim  trimetrexate  vaccines Talk to your doctor or health care professional before taking any of these medicines:  acetaminophen  aspirin  ibuprofen  ketoprofen  naproxen This list may not describe all possible interactions. Give your health care provider a list of all the medicines, herbs, non-prescription drugs, or dietary supplements you use. Also tell them if you smoke, drink alcohol, or use illegal drugs. Some items may interact with your medicine. What should I watch for while using this medicine? Visit your doctor for checks on your progress. This drug may make you feel generally unwell. This is not uncommon, as chemotherapy can affect healthy cells as well as cancer cells. Report any side effects. Continue your course of treatment even though you feel ill unless your doctor tells you to stop. In some cases, you may be given additional medicines to help with side effects. Follow all directions for their use. Call your doctor or health care professional for advice if you get a fever, chills or sore throat, or other symptoms of a cold or flu. Do not treat yourself. This drug decreases your body's ability to fight infections. Try to avoid being around people who are sick. This medicine may increase your risk to bruise or bleed. Call your doctor or health care professional if you notice any unusual bleeding. Be careful brushing and flossing your teeth or using a toothpick because you may get  an infection or bleed more easily. If you have any dental work done, tell your dentist you are receiving this medicine. Avoid taking products that contain aspirin, acetaminophen,  ibuprofen, naproxen, or ketoprofen unless instructed by your doctor. These medicines may hide a fever. Do not become pregnant while taking this medicine. Women should inform their doctor if they wish to become pregnant or think they might be pregnant. There is a potential for serious side effects to an unborn child. Talk to your health care professional or pharmacist for more information. Do not breast-feed an infant while taking this medicine. Men should inform their doctor if they wish to father a child. This medicine may lower sperm counts. Do not treat diarrhea with over the counter products. Contact your doctor if you have diarrhea that lasts more than 2 days or if it is severe and watery. This medicine can make you more sensitive to the sun. Keep out of the sun. If you cannot avoid being in the sun, wear protective clothing and use sunscreen. Do not use sun lamps or tanning beds/booths. What side effects may I notice from receiving this medicine? Side effects that you should report to your doctor or health care professional as soon as possible:  allergic reactions like skin rash, itching or hives, swelling of the face, lips, or tongue  low blood counts - this medicine may decrease the number of white blood cells, red blood cells and platelets. You may be at increased risk for infections and bleeding.  signs of infection - fever or chills, cough, sore throat, pain or difficulty passing urine  signs of decreased platelets or bleeding - bruising, pinpoint red spots on the skin, black, tarry stools, blood in the urine  signs of decreased red blood cells - unusually weak or tired, fainting spells, lightheadedness  breathing problems  changes in vision  chest pain  mouth sores  nausea and vomiting  pain, swelling, redness at site where injected  pain, tingling, numbness in the hands or feet  redness, swelling, or sores on hands or feet  stomach pain  unusual bleeding Side effects  that usually do not require medical attention (report to your doctor or health care professional if they continue or are bothersome):  changes in finger or toe nails  diarrhea  dry or itchy skin  hair loss  headache  loss of appetite  sensitivity of eyes to the light  stomach upset  unusually teary eyes This list may not describe all possible side effects. Call your doctor for medical advice about side effects. You may report side effects to FDA at 1-800-FDA-1088. Where should I keep my medicine? This drug is given in a hospital or clinic and will not be stored at home. NOTE: This sheet is a summary. It may not cover all possible information. If you have questions about this medicine, talk to your doctor, pharmacist, or health care provider.  2020 Elsevier/Gold Standard (2007-11-22 13:53:16)

## 2020-02-26 ENCOUNTER — Ambulatory Visit (HOSPITAL_BASED_OUTPATIENT_CLINIC_OR_DEPARTMENT_OTHER)
Admission: RE | Admit: 2020-02-26 | Discharge: 2020-02-26 | Disposition: A | Discharge status: Home / Self Care | Payer: Self-pay | Source: Ambulatory Visit | Attending: Hematology & Oncology | Admitting: Hematology & Oncology

## 2020-02-26 ENCOUNTER — Other Ambulatory Visit: Payer: Self-pay

## 2020-02-26 ENCOUNTER — Other Ambulatory Visit (HOSPITAL_BASED_OUTPATIENT_CLINIC_OR_DEPARTMENT_OTHER): Payer: Self-pay

## 2020-02-26 DIAGNOSIS — I2699 Other pulmonary embolism without acute cor pulmonale: Secondary | ICD-10-CM | POA: Insufficient documentation

## 2020-02-27 ENCOUNTER — Telehealth: Payer: Self-pay | Admitting: *Deleted

## 2020-02-27 NOTE — Telephone Encounter (Signed)
-----   Message from Volanda Napoleon, MD sent at 02/27/2020  8:04 AM EDT ----- Call - most of the blood clot is gone in the left leg!! Laurey Arrow

## 2020-02-27 NOTE — Telephone Encounter (Signed)
As noted below by Dr. Marin Olp, I informed the patient that most of the blood clot is gone in the left leg. She verbalized understanding.

## 2020-03-04 ENCOUNTER — Telehealth: Payer: Self-pay | Admitting: Hematology & Oncology

## 2020-03-04 ENCOUNTER — Encounter: Payer: Self-pay | Admitting: Family

## 2020-03-04 ENCOUNTER — Inpatient Hospital Stay: Payer: Medicaid Other

## 2020-03-04 ENCOUNTER — Other Ambulatory Visit: Payer: Self-pay

## 2020-03-04 ENCOUNTER — Inpatient Hospital Stay: Payer: Medicaid Other | Attending: Family | Admitting: Family

## 2020-03-04 ENCOUNTER — Encounter: Payer: Self-pay | Admitting: Infectious Disease

## 2020-03-04 VITALS — BP 94/68 | HR 110 | Temp 98.6°F | Resp 19 | Ht 62.0 in | Wt 133.1 lb

## 2020-03-04 DIAGNOSIS — C180 Malignant neoplasm of cecum: Secondary | ICD-10-CM

## 2020-03-04 DIAGNOSIS — Z79899 Other long term (current) drug therapy: Secondary | ICD-10-CM | POA: Diagnosis not present

## 2020-03-04 DIAGNOSIS — D500 Iron deficiency anemia secondary to blood loss (chronic): Secondary | ICD-10-CM | POA: Insufficient documentation

## 2020-03-04 DIAGNOSIS — Z95828 Presence of other vascular implants and grafts: Secondary | ICD-10-CM

## 2020-03-04 DIAGNOSIS — Z7952 Long term (current) use of systemic steroids: Secondary | ICD-10-CM | POA: Diagnosis not present

## 2020-03-04 DIAGNOSIS — Z21 Asymptomatic human immunodeficiency virus [HIV] infection status: Secondary | ICD-10-CM | POA: Insufficient documentation

## 2020-03-04 DIAGNOSIS — I2699 Other pulmonary embolism without acute cor pulmonale: Secondary | ICD-10-CM | POA: Insufficient documentation

## 2020-03-04 DIAGNOSIS — Z7901 Long term (current) use of anticoagulants: Secondary | ICD-10-CM | POA: Insufficient documentation

## 2020-03-04 DIAGNOSIS — C779 Secondary and unspecified malignant neoplasm of lymph node, unspecified: Secondary | ICD-10-CM

## 2020-03-04 DIAGNOSIS — M545 Low back pain, unspecified: Secondary | ICD-10-CM

## 2020-03-04 DIAGNOSIS — K922 Gastrointestinal hemorrhage, unspecified: Secondary | ICD-10-CM | POA: Diagnosis not present

## 2020-03-04 DIAGNOSIS — E876 Hypokalemia: Secondary | ICD-10-CM

## 2020-03-04 DIAGNOSIS — R5383 Other fatigue: Secondary | ICD-10-CM | POA: Diagnosis not present

## 2020-03-04 DIAGNOSIS — C787 Secondary malignant neoplasm of liver and intrahepatic bile duct: Secondary | ICD-10-CM

## 2020-03-04 DIAGNOSIS — R634 Abnormal weight loss: Secondary | ICD-10-CM | POA: Diagnosis not present

## 2020-03-04 DIAGNOSIS — R630 Anorexia: Secondary | ICD-10-CM

## 2020-03-04 DIAGNOSIS — I82462 Acute embolism and thrombosis of left calf muscular vein: Secondary | ICD-10-CM | POA: Insufficient documentation

## 2020-03-04 LAB — CBC WITH DIFFERENTIAL (CANCER CENTER ONLY)
Abs Immature Granulocytes: 0.3 10*3/uL — ABNORMAL HIGH (ref 0.00–0.07)
Basophils Absolute: 0.1 10*3/uL (ref 0.0–0.1)
Basophils Relative: 1 %
Eosinophils Absolute: 0.1 10*3/uL (ref 0.0–0.5)
Eosinophils Relative: 1 %
HCT: 33.1 % — ABNORMAL LOW (ref 36.0–46.0)
Hemoglobin: 10.9 g/dL — ABNORMAL LOW (ref 12.0–15.0)
Immature Granulocytes: 2 %
Lymphocytes Relative: 27 %
Lymphs Abs: 3.3 10*3/uL (ref 0.7–4.0)
MCH: 29.9 pg (ref 26.0–34.0)
MCHC: 32.9 g/dL (ref 30.0–36.0)
MCV: 90.9 fL (ref 80.0–100.0)
Monocytes Absolute: 1.2 10*3/uL — ABNORMAL HIGH (ref 0.1–1.0)
Monocytes Relative: 10 %
Neutro Abs: 7.3 10*3/uL (ref 1.7–7.7)
Neutrophils Relative %: 59 %
Platelet Count: 259 10*3/uL (ref 150–400)
RBC: 3.64 MIL/uL — ABNORMAL LOW (ref 3.87–5.11)
RDW: 20.8 % — ABNORMAL HIGH (ref 11.5–15.5)
WBC Count: 12.3 10*3/uL — ABNORMAL HIGH (ref 4.0–10.5)
nRBC: 0 % (ref 0.0–0.2)

## 2020-03-04 LAB — CMP (CANCER CENTER ONLY)
ALT: 12 U/L (ref 0–44)
AST: 18 U/L (ref 15–41)
Albumin: 3.5 g/dL (ref 3.5–5.0)
Alkaline Phosphatase: 117 U/L (ref 38–126)
Anion gap: 16 — ABNORMAL HIGH (ref 5–15)
BUN: 10 mg/dL (ref 6–20)
CO2: 24 mmol/L (ref 22–32)
Calcium: 7.4 mg/dL — ABNORMAL LOW (ref 8.9–10.3)
Chloride: 100 mmol/L (ref 98–111)
Creatinine: 0.67 mg/dL (ref 0.44–1.00)
GFR, Est AFR Am: >60 mL/min (ref >60)
GFR, Estimated: >60 mL/min (ref >60)
Glucose, Bld: 100 mg/dL — ABNORMAL HIGH (ref 70–99)
Potassium: 2.4 mmol/L — CL (ref 3.5–5.1)
Sodium: 140 mmol/L (ref 135–145)
Total Bilirubin: 0.4 mg/dL (ref 0.3–1.2)
Total Protein: 6.3 g/dL — ABNORMAL LOW (ref 6.5–8.1)

## 2020-03-04 LAB — IRON AND TIBC
Iron: 120 ug/dL (ref 41–142)
Saturation Ratios: 65 % — ABNORMAL HIGH (ref 21–57)
TIBC: 185 ug/dL — ABNORMAL LOW (ref 236–444)
UIBC: 65 ug/dL — ABNORMAL LOW (ref 120–384)

## 2020-03-04 LAB — FERRITIN: Ferritin: 3903 ng/mL — ABNORMAL HIGH (ref 11–307)

## 2020-03-04 LAB — CEA (IN HOUSE-CHCC): CEA (CHCC-In House): 191.32 ng/mL — ABNORMAL HIGH (ref 0.00–5.00)

## 2020-03-04 MED ORDER — SODIUM CHLORIDE 0.9% FLUSH
10.0000 mL | Freq: Once | INTRAVENOUS | Status: AC
  Administered 2020-03-04: 10 mL via INTRAVENOUS
  Filled 2020-03-04: qty 10

## 2020-03-04 MED ORDER — POTASSIUM CHLORIDE CRYS ER 20 MEQ PO TBCR: 40.0000 meq | EXTENDED_RELEASE_TABLET | Freq: Every day | ORAL | 1 refills | Status: DC

## 2020-03-04 MED ORDER — TRAMADOL HCL 50 MG PO TABS: 50.0000 mg | ORAL_TABLET | Freq: Two times a day (BID) | ORAL | 0 refills | Status: DC | PRN

## 2020-03-04 MED ORDER — HEPARIN SOD (PORK) LOCK FLUSH 100 UNIT/ML IV SOLN
500.0000 [IU] | Freq: Once | INTRAVENOUS | Status: AC
  Administered 2020-03-04: 500 [IU] via INTRAVENOUS
  Filled 2020-03-04: qty 5

## 2020-03-04 MED ORDER — PALONOSETRON HCL INJECTION 0.25 MG/5ML
INTRAVENOUS | Status: AC
  Filled 2020-03-04: qty 5

## 2020-03-04 MED ORDER — POTASSIUM CHLORIDE CRYS ER 20 MEQ PO TBCR
40.0000 meq | EXTENDED_RELEASE_TABLET | Freq: Once | ORAL | Status: AC
  Administered 2020-03-04: 40 meq via ORAL
  Filled 2020-03-04: qty 2

## 2020-03-04 NOTE — Patient Instructions (Signed)

## 2020-03-04 NOTE — Progress Notes (Signed)
Cbc and cmet reviewed with provider. VO for K+ 53meq now.  New rx for K+ sent to pts pharmacy.  Pt will begin 4meq daily.  Instructions given to pt, verbalized understanding. Per MD, no treatment today, 2 week break for pt.

## 2020-03-04 NOTE — Progress Notes (Signed)
Hematology and Oncology Follow Up Visit  Anita Hoffman [id number] 1961-03-19 59 y.o. 03/04/2020   Principle Diagnosis:  Metastatic adenocarcinoma of the cecum-liver metastasis Pulmonary emboli and LEFT gastrocnemius vein thrombus HIV-asymptomatic Iron deficiency secondary to GI bleeding  Past Therapy:  Lovenox 80 mg subcu twice daily -- d/c on 02/19/2020  Current Therapy: FOLFOXIRI - startedon 12/12/2019, s/p cycle 6 Eliquis 5 mg po BID -- start on 02/19/2020 Biktarvy 1 p.o. daily IV ironas indicated Udenycasq post chemo   Interim History:  [name] is here today for follow-up and treatment. She is doing fairly well but did hurt her lower back helping pick her brother up. She states that the pan eases up when she takes Tylenol but comes back. We will have her try Tramodol.  So far, she has responded nicely to treatment and is tolerating well. CEA 2 weeks ago was down to 334! Repeat US of her last leg last week showed some residual chronic thrombus identified in small caliber left gastrocnemius and peroneal veins in the calf. She is tolerating Lovenox well (she could not afford Eliquis) and has had no issues with bleeding. No bruising or petechiae.  No fever, chills, n/v, cough, rash, dizziness, SOB, chest pain, palpitations, abdominal pain or changes in bowel or bladder habits.  No swelling, tenderness, numbness or tingling in her extremities.  No falls or syncope.  She states that her appetite is down but that she is staying well hydrated. She does not think she picked up the Marinol. Her weight is down 10 lbs.  She will add in the [place] protein supplement daily that she tried and liked.   ECOG Performance Status: 1 - Symptomatic but completely ambulatory  Medications:  Allergies as of 03/04/2020   No Known Allergies     Medication List       Accurate as of March 04, 2020  9:49 AM. If you have any questions, ask your nurse or doctor.        apixaban 5  MG Tabs tablet Commonly known as: ELIQUIS Take 1 tablet (5 mg total) by mouth 2 (two) times daily.   Biktarvy 50-200-25 MG Tabs tablet Generic drug: bictegravir-emtricitabine-tenofovir AF Take 1 tablet by mouth daily.   dexamethasone 4 MG tablet Commonly known as: DECADRON Take 2 tablets (8 mg total) by mouth daily. Start the day after chemotherapy for 3 days. Take with food.   dronabinol 2.5 MG capsule Commonly known as: MARINOL Take 1 capsule (2.5 mg total) by mouth 2 (two) times daily before a meal.   enoxaparin 80 MG/0.8ML injection Commonly known as: LOVENOX Inject 0.8 mLs (80 mg total) into the skin 2 (two) times daily.   folic acid 1 MG tablet Commonly known as: FOLVITE Take 2 tablets (2 mg total) by mouth daily.   lidocaine-prilocaine cream Commonly known as: EMLA Apply to affected area once   loperamide 2 MG tablet Commonly known as: Imodium A-D Take 2 at onset of diarrhea, then 1 every 2hrs until 12hr without a BM. May take 2 tab every 4hrs at bedtime. If diarrhea recurs repeat.   LORazepam 0.5 MG tablet Commonly known as: Ativan Take 1 tablet (0.5 mg total) by mouth every 6 (six) hours as needed for anxiety.   ondansetron 8 MG tablet Commonly known as: Zofran Take 1 tablet (8 mg total) by mouth 2 (two) times daily as needed. Start on day 3 after chemotherapy.   pantoprazole 40 MG tablet Commonly known as: PROTONIX Take 1 tablet (40  mg total) by mouth 2 (two) times daily.   potassium chloride SA 20 MEQ tablet Commonly known as: KLOR-CON Take 1 tablet (20 mEq total) by mouth daily.   prochlorperazine 10 MG tablet Commonly known as: COMPAZINE Take 1 tablet (10 mg total) by mouth every 6 (six) hours as needed (Nausea or vomiting).       Allergies: No Known Allergies  Past Medical History, Surgical history, Social history, and Family History were reviewed and updated.  Review of Systems: All other 10 point review of systems is negative.   Physical  Exam:  vitals were not taken for this visit.   Wt Readings from Last 3 Encounters:  02/19/20 143 lb (64.9 kg)  02/06/20 147 lb (66.7 kg)  02/05/20 146 lb (66.2 kg)    Ocular: Sclerae unicteric, pupils equal, round and reactive to light Ear-nose-throat: Oropharynx clear, dentition fair Lymphatic: No cervical or supraclavicular adenopathy Lungs no rales or rhonchi, good excursion bilaterally Heart regular rate and rhythm, no murmur appreciated Abd soft, nontender, positive bowel sounds, no liver or spleen tip palpated on exam, no fluid wave  MSK no focal spinal tenderness, no joint edema Neuro: non-focal, well-oriented, appropriate affect Breasts: Deferred   Lab Results  Component Value Date   WBC 12.3 (H) 03/04/2020   HGB 10.9 (L) 03/04/2020   HCT 33.1 (L) 03/04/2020   MCV 90.9 03/04/2020   PLT 259 03/04/2020   Lab Results  Component Value Date   FERRITIN 2,222 (H) 01/22/2020   IRON 103 01/22/2020   TIBC 196 (L) 01/22/2020   UIBC 93 (L) 01/22/2020   IRONPCTSAT 53 01/22/2020   Lab Results  Component Value Date   RBC 3.64 (L) 03/04/2020   No results found for: KPAFRELGTCHN, LAMBDASER, KAPLAMBRATIO No results found for: IGGSERUM, IGA, IGMSERUM No results found for: Odetta Pink, SPEI   Chemistry      Component Value Date/Time   NA 138 02/19/2020 0915   K 2.7 (LL) 02/19/2020 0915   CL 97 (L) 02/19/2020 0915   CO2 29 02/19/2020 0915   BUN 7 02/19/2020 0915   CREATININE 0.70 02/19/2020 0915   CREATININE 0.59 01/03/2020 1216      Component Value Date/Time   CALCIUM 8.1 (L) 02/19/2020 0915   ALKPHOS 101 02/19/2020 0915   AST 22 02/19/2020 0915   ALT 12 02/19/2020 0915   BILITOT 0.4 02/19/2020 0915       Impression and Plan: AnitaWilliamsis a verypleasant 59 yo African American with metastatic colon cancer primarily ofthe cecum withextensive liver metastasis. She has had a nice response to treatment so far  and has no complaints at this time.  Due to her 10 lb weight loss we will hold treatment today and give her a 2 weeks break.  She will start taking her Marinol BID as prescribed and add Breeze protein drink once a day to help with weight gain. We will try Tramadol for the lower back pain.  She also had a low potassium level again today so she will get 40 meq today while she is here and then another 40 meq this evening and then 40 meq daily starting tomorrow.  She verbalized understanding and I did write all of the above out for her in detail to take home. She can contact our office with any questions or concerns. We will see her again in 2 weeks.   Greater than 50% of her visit was spent counseling and coordinating care.  Laverna Peace, NP 8/3/20219:49 AM

## 2020-03-04 NOTE — Telephone Encounter (Signed)
Appointments scheduled calendar given to patient by infusion per secure chat 8/2 los

## 2020-03-06 ENCOUNTER — Inpatient Hospital Stay: Payer: Medicaid Other

## 2020-03-10 ENCOUNTER — Encounter: Payer: Self-pay | Admitting: *Deleted

## 2020-03-10 NOTE — Progress Notes (Signed)
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

## 2020-03-18 ENCOUNTER — Inpatient Hospital Stay (HOSPITAL_BASED_OUTPATIENT_CLINIC_OR_DEPARTMENT_OTHER): Payer: Medicaid Other | Admitting: Family

## 2020-03-18 ENCOUNTER — Telehealth: Payer: Self-pay | Admitting: *Deleted

## 2020-03-18 ENCOUNTER — Encounter: Payer: Self-pay | Admitting: Family

## 2020-03-18 ENCOUNTER — Encounter: Payer: Self-pay | Admitting: *Deleted

## 2020-03-18 ENCOUNTER — Inpatient Hospital Stay: Payer: Medicaid Other

## 2020-03-18 ENCOUNTER — Other Ambulatory Visit: Payer: Self-pay

## 2020-03-18 VITALS — BP 108/81 | HR 92 | Temp 98.6°F | Resp 18 | Ht 62.0 in | Wt 132.0 lb

## 2020-03-18 DIAGNOSIS — C180 Malignant neoplasm of cecum: Secondary | ICD-10-CM | POA: Diagnosis not present

## 2020-03-18 DIAGNOSIS — R630 Anorexia: Secondary | ICD-10-CM

## 2020-03-18 DIAGNOSIS — D500 Iron deficiency anemia secondary to blood loss (chronic): Secondary | ICD-10-CM

## 2020-03-18 DIAGNOSIS — R634 Abnormal weight loss: Secondary | ICD-10-CM

## 2020-03-18 DIAGNOSIS — E876 Hypokalemia: Secondary | ICD-10-CM

## 2020-03-18 DIAGNOSIS — C787 Secondary malignant neoplasm of liver and intrahepatic bile duct: Secondary | ICD-10-CM

## 2020-03-18 LAB — CBC WITH DIFFERENTIAL (CANCER CENTER ONLY)
Abs Immature Granulocytes: 0.07 10*3/uL (ref 0.00–0.07)
Basophils Absolute: 0.1 10*3/uL (ref 0.0–0.1)
Basophils Relative: 1 %
Eosinophils Absolute: 0.1 10*3/uL (ref 0.0–0.5)
Eosinophils Relative: 1 %
HCT: 31.9 % — ABNORMAL LOW (ref 36.0–46.0)
Hemoglobin: 10.6 g/dL — ABNORMAL LOW (ref 12.0–15.0)
Immature Granulocytes: 1 %
Lymphocytes Relative: 27 %
Lymphs Abs: 2.5 10*3/uL (ref 0.7–4.0)
MCH: 31.5 pg (ref 26.0–34.0)
MCHC: 33.2 g/dL (ref 30.0–36.0)
MCV: 94.9 fL (ref 80.0–100.0)
Monocytes Absolute: 0.8 10*3/uL (ref 0.1–1.0)
Monocytes Relative: 9 %
Neutro Abs: 5.7 10*3/uL (ref 1.7–7.7)
Neutrophils Relative %: 61 %
Platelet Count: 337 10*3/uL (ref 150–400)
RBC: 3.36 MIL/uL — ABNORMAL LOW (ref 3.87–5.11)
RDW: 18.6 % — ABNORMAL HIGH (ref 11.5–15.5)
WBC Count: 9.2 10*3/uL (ref 4.0–10.5)
nRBC: 0 % (ref 0.0–0.2)

## 2020-03-18 LAB — CMP (CANCER CENTER ONLY)
ALT: 6 U/L (ref 0–44)
AST: 18 U/L (ref 15–41)
Albumin: 3.1 g/dL — ABNORMAL LOW (ref 3.5–5.0)
Alkaline Phosphatase: 116 U/L (ref 38–126)
Anion gap: 12 (ref 5–15)
BUN: 6 mg/dL (ref 6–20)
CO2: 29 mmol/L (ref 22–32)
Calcium: 7 mg/dL — ABNORMAL LOW (ref 8.9–10.3)
Chloride: 97 mmol/L — ABNORMAL LOW (ref 98–111)
Creatinine: 0.52 mg/dL (ref 0.44–1.00)
GFR, Est AFR Am: >60 mL/min (ref >60)
GFR, Estimated: >60 mL/min (ref >60)
Glucose, Bld: 85 mg/dL (ref 70–99)
Potassium: 2.6 mmol/L — CL (ref 3.5–5.1)
Sodium: 138 mmol/L (ref 135–145)
Total Bilirubin: 0.5 mg/dL (ref 0.3–1.2)
Total Protein: 5.9 g/dL — ABNORMAL LOW (ref 6.5–8.1)

## 2020-03-18 LAB — FERRITIN: Ferritin: 2873 ng/mL — ABNORMAL HIGH (ref 11–307)

## 2020-03-18 LAB — IRON AND TIBC
Iron: 84 ug/dL (ref 41–142)
Saturation Ratios: 52 % (ref 21–57)
TIBC: 163 ug/dL — ABNORMAL LOW (ref 236–444)
UIBC: 78 ug/dL — ABNORMAL LOW (ref 120–384)

## 2020-03-18 LAB — CEA (IN HOUSE-CHCC): CEA (CHCC-In House): 98.3 ng/mL — ABNORMAL HIGH (ref 0.00–5.00)

## 2020-03-18 MED ORDER — SODIUM CHLORIDE 0.9 % IV SOLN
Freq: Once | INTRAVENOUS | Status: AC
  Filled 2020-03-18: qty 1000

## 2020-03-18 MED ORDER — HEPARIN SOD (PORK) LOCK FLUSH 100 UNIT/ML IV SOLN
500.0000 [IU] | Freq: Once | INTRAVENOUS | Status: AC | PRN
  Administered 2020-03-18: 500 [IU]
  Filled 2020-03-18: qty 5

## 2020-03-18 MED ORDER — PREDNISONE 10 MG PO TABS: 10.0000 mg | ORAL_TABLET | Freq: Every day | ORAL | 3 refills | Status: DC

## 2020-03-18 MED ORDER — SODIUM CHLORIDE 0.9 % IV SOLN
Freq: Once | INTRAVENOUS | Status: AC
  Filled 2020-03-18: qty 250

## 2020-03-18 MED ORDER — SODIUM CHLORIDE 0.9% FLUSH
10.0000 mL | Freq: Once | INTRAVENOUS | Status: AC | PRN
  Administered 2020-03-18: 10 mL
  Filled 2020-03-18: qty 10

## 2020-03-18 MED ORDER — POTASSIUM CHLORIDE CRYS ER 20 MEQ PO TBCR: 40.0000 meq | EXTENDED_RELEASE_TABLET | Freq: Two times a day (BID) | ORAL | 1 refills | Status: AC

## 2020-03-18 NOTE — Patient Instructions (Signed)
You will have two prescriptions to pick up from the [facility] on [address].  Potassium is increased to two tablets twice a day and also Prednisone to help increase your appetite.      Hypokalemia Hypokalemia means that the amount of potassium in the blood is lower than normal. Potassium is a chemical (electrolyte) that helps regulate the amount of fluid in the body. It also stimulates muscle tightening (contraction) and helps nerves work properly. Normally, most of the body's potassium is inside cells, and only a very small amount is in the blood. Because the amount in the blood is so small, minor changes to potassium levels in the blood can be life-threatening. What are the causes? This condition may be caused by:  Antibiotic medicine.  Diarrhea or vomiting. Taking too much of a medicine that helps you have a bowel movement (laxative) can cause diarrhea and lead to hypokalemia.  Chronic kidney disease (CKD).  Medicines that help the body get rid of excess fluid (diuretics).  Eating disorders, such as bulimia.  Low magnesium levels in the body.  Sweating a lot. What are the signs or symptoms? Symptoms of this condition include:  Weakness.  Constipation.  Fatigue.  Muscle cramps.  Mental confusion.  Skipped heartbeats or irregular heartbeat (palpitations).  Tingling or numbness. How is this diagnosed? This condition is diagnosed with a blood test. How is this treated? This condition may be treated by:  Taking potassium supplements by mouth.  Adjusting the medicines that you take.  Eating more foods that contain a lot of potassium. If your potassium level is very low, you may need to get potassium through an IV and be monitored in the hospital. Follow these instructions at home:   Take over-the-counter and prescription medicines only as told by your health care provider. This includes vitamins and supplements.  Eat a healthy diet. A healthy diet includes fresh  fruits and vegetables, whole grains, healthy fats, and lean proteins.  If instructed, eat more foods that contain a lot of potassium. This includes: ? Nuts, such as peanuts and pistachios. ? Seeds, such as sunflower seeds and pumpkin seeds. ? Peas, lentils, and lima beans. ? Whole grain and bran cereals and breads. ? Fresh fruits and vegetables, such as apricots, avocado, bananas, cantaloupe, kiwi, oranges, tomatoes, asparagus, and potatoes. ? Orange juice. ? Tomato juice. ? Red meats. ? Yogurt.  Keep all follow-up visits as told by your health care provider. This is important. Contact a health care provider if you:  Have weakness that gets worse.  Feel your heart pounding or racing.  Vomit.  Have diarrhea.  Have diabetes (diabetes mellitus) and you have trouble keeping your blood sugar (glucose) in your target range. Get help right away if you:  Have chest pain.  Have shortness of breath.  Have vomiting or diarrhea that lasts for more than 2 days.  Faint. Summary  Hypokalemia means that the amount of potassium in the blood is lower than normal.  This condition is diagnosed with a blood test.  Hypokalemia may be treated by taking potassium supplements, adjusting the medicines that you take, or eating more foods that are high in potassium.  If your potassium level is very low, you may need to get potassium through an IV and be monitored in the hospital. This information is not intended to replace advice given to you by your health care provider. Make sure you discuss any questions you have with your health care provider. Document Revised: 03/01/2018 Document Reviewed:  03/01/2018 Elsevier Patient Education  [place].

## 2020-03-18 NOTE — Progress Notes (Signed)
Oncology Nurse Navigator Documentation  Oncology Nurse Navigator Flowsheets 03/18/2020  Abnormal Finding Date -  Confirmed Diagnosis Date -  Diagnosis Status -  Planned Course of Treatment -  Phase of Treatment -  Chemotherapy Actual Start Date: -  Navigator Follow Up Date: -  Navigator Follow Up Reason: -  Navigation Complete Date: 03/18/2020  Post Navigation: Continue to Follow Patient? No  Reason Not Navigating Patient: Patient On Maintenance Chemotherapy  Navigator Location [facility]  Navigator Encounter Type Treatment;Appt/Treatment Plan Review  Telephone -  Treatment Initiated Date -  Patient Visit Type MedOnc  Treatment Phase Active Tx  Barriers/Navigation Needs No Barriers At This Time  Education -  Interventions Psycho-Social Support  Acuity Level 1-No Barriers  Referrals -  Coordination of Care -  Education Method -  Support Groups/Services Friends and Family  Time Spent with Patient 15

## 2020-03-18 NOTE — Progress Notes (Signed)
Hematology and Oncology Follow Up Visit  Anita Hoffman [id number] 20-Jul-1961 59 y.o. 03/18/2020   Principle Diagnosis:  Metastatic adenocarcinoma of the cecum-liver metastasis Pulmonary emboli and LEFT gastrocnemius vein thrombus HIV-asymptomatic Iron deficiency secondary to GI bleeding  Current Therapy: FOLFOXIRI - startedon 12/12/2019, s/p cycle6 Biktarvy 1 p.o. daily Udenycasq post chemo Lovenox 80 mg subcu twice daily IV ironas indicated   Interim History:  Anita Hoffman is here today for follow-up and treatment. We help treatment 2 weeks ago due to 10 lb weight loss and low potassium.  She states that the Marinol was too expensive and she did not pick up. She states that her appetite is only at around 25%. She is doing her best to stay well hydrated. Her weight is down another 1 lb since her last visit.  She is taking KDUR 40 meq daily. Potassium level is still low at 2.6.  No fever, chills, n/v, cough, rash, dizziness, SOB, chest pain, palpitations, abdominal pain or changes in bowel or bladder habits.  She is tolerating Lovenox BID well. No episodes of bleeding. No abnormal bruising or petechiae.  No swelling, tenderness, numbness or tingling in her extremities. She has had 2 episodes where she got a catch/cramp in the fingers of her left hand but was able to work them out.  No c/o pain at this time.  No syncopal episodes or falls to report.   ECOG Performance Status: 1 - Symptomatic but completely ambulatory  Medications:  Allergies as of 03/18/2020   No Known Allergies     Medication List       Accurate as of March 18, 2020  9:36 AM. If you have any questions, ask your nurse or doctor.        apixaban 5 MG Tabs tablet Commonly known as: ELIQUIS Take 1 tablet (5 mg total) by mouth 2 (two) times daily.   Biktarvy 50-200-25 MG Tabs tablet Generic drug: bictegravir-emtricitabine-tenofovir AF Take 1 tablet by mouth daily.   dexamethasone 4 MG  tablet Commonly known as: DECADRON Take 2 tablets (8 mg total) by mouth daily. Start the day after chemotherapy for 3 days. Take with food.   dronabinol 2.5 MG capsule Commonly known as: MARINOL Take 1 capsule (2.5 mg total) by mouth 2 (two) times daily before a meal.   enoxaparin 80 MG/0.8ML injection Commonly known as: LOVENOX Inject 0.8 mLs (80 mg total) into the skin 2 (two) times daily.   folic acid 1 MG tablet Commonly known as: FOLVITE Take 2 tablets (2 mg total) by mouth daily.   lidocaine-prilocaine cream Commonly known as: EMLA Apply to affected area once   loperamide 2 MG tablet Commonly known as: Imodium A-D Take 2 at onset of diarrhea, then 1 every 2hrs until 12hr without a BM. May take 2 tab every 4hrs at bedtime. If diarrhea recurs repeat.   LORazepam 0.5 MG tablet Commonly known as: Ativan Take 1 tablet (0.5 mg total) by mouth every 6 (six) hours as needed for anxiety.   ondansetron 8 MG tablet Commonly known as: Zofran Take 1 tablet (8 mg total) by mouth 2 (two) times daily as needed. Start on day 3 after chemotherapy.   pantoprazole 40 MG tablet Commonly known as: PROTONIX Take 1 tablet (40 mg total) by mouth 2 (two) times daily.   potassium chloride SA 20 MEQ tablet Commonly known as: KLOR-CON Take 2 tablets (40 mEq total) by mouth daily.   prochlorperazine 10 MG tablet Commonly known as: COMPAZINE Take 1  tablet (10 mg total) by mouth every 6 (six) hours as needed (Nausea or vomiting).   traMADol 50 MG tablet Commonly known as: ULTRAM Take 1 tablet (50 mg total) by mouth every 12 (twelve) hours as needed.       Allergies: No Known Allergies  Past Medical History, Surgical history, Social history, and Family History were reviewed and updated.  Review of Systems: All other 10 point review of systems is negative.   Physical Exam:  height is 5\' 2"  (1.575 m) and weight is 132 lb (59.9 kg). Her oral temperature is 98.6 F (37 C). Her blood  pressure is 108/81 and her pulse is 92. Her respiration is 18 and oxygen saturation is 99%.   Wt Readings from Last 3 Encounters:  03/18/20 132 lb (59.9 kg)  03/04/20 133 lb 1.9 oz (60.4 kg)  02/19/20 143 lb (64.9 kg)    Ocular: Sclerae unicteric, pupils equal, round and reactive to light Ear-nose-throat: Oropharynx clear, dentition fair Lymphatic: No cervical or supraclavicular adenopathy Lungs no rales or rhonchi, good excursion bilaterally Heart regular rate and rhythm, no murmur appreciated Abd soft, nontender, positive bowel sounds, no liver or spleen tip palpated on exam, no fluid wave  MSK no focal spinal tenderness, no joint edema Neuro: non-focal, well-oriented, appropriate affect Breasts: Deferred   Lab Results  Component Value Date   WBC 9.2 03/18/2020   HGB 10.6 (L) 03/18/2020   HCT 31.9 (L) 03/18/2020   MCV 94.9 03/18/2020   PLT 337 03/18/2020   Lab Results  Component Value Date   FERRITIN 3,903 (H) 03/04/2020   IRON 120 03/04/2020   TIBC 185 (L) 03/04/2020   UIBC 65 (L) 03/04/2020   IRONPCTSAT 65 (H) 03/04/2020   Lab Results  Component Value Date   RBC 3.36 (L) 03/18/2020   No results found for: KPAFRELGTCHN, LAMBDASER, KAPLAMBRATIO No results found for: IGGSERUM, IGA, IGMSERUM No results found for: Ronnald Ramp, A1GS, A2GS, Violet Baldy, MSPIKE, SPEI   Chemistry      Component Value Date/Time   NA 138 03/18/2020 0845   K 2.6 (LL) 03/18/2020 0845   CL 97 (L) 03/18/2020 0845   CO2 29 03/18/2020 0845   BUN 6 03/18/2020 0845   CREATININE 0.52 03/18/2020 0845   CREATININE 0.59 01/03/2020 1216      Component Value Date/Time   CALCIUM 7.0 (L) 03/18/2020 0845   ALKPHOS 116 03/18/2020 0845   AST 18 03/18/2020 0845   ALT 6 03/18/2020 0845   BILITOT 0.5 03/18/2020 0845       Impression and Plan: AnitaWilliamsis a verypleasant59 yo African American with metastatic colon cancer primarily ofthe cecum withextensive liver  metastasis. CEA was down to 191 at her last visit! Unfortunately she is still losing weight and her potassium remains low.  We will give her a liter of fluids today with 40 meq of potassium in it.  We will have her start taking her KDUR 40 meq PO BID.  We will also start her on a low dose steroid 10 mg PO of prednisone daily to boost appetite and weight gain.  We will hold treatment again for another 2 weeks per Dr. Marin Olp.  She verbalized understanding and will contact our office with any questions or concerns.   Laverna Peace, NP 8/17/20219:36 AM

## 2020-03-18 NOTE — Telephone Encounter (Signed)
Received notice that patient potassium is 2.6.  Laverna Peace NP aware and is seeing patient in office today.

## 2020-03-20 ENCOUNTER — Inpatient Hospital Stay: Payer: Medicaid Other

## 2020-04-01 ENCOUNTER — Inpatient Hospital Stay: Payer: Medicaid Other

## 2020-04-01 ENCOUNTER — Other Ambulatory Visit: Payer: Self-pay

## 2020-04-01 ENCOUNTER — Inpatient Hospital Stay (HOSPITAL_BASED_OUTPATIENT_CLINIC_OR_DEPARTMENT_OTHER): Payer: Medicaid Other | Admitting: Family

## 2020-04-01 ENCOUNTER — Encounter: Payer: Self-pay | Admitting: Family

## 2020-04-01 ENCOUNTER — Telehealth: Payer: Self-pay | Admitting: *Deleted

## 2020-04-01 ENCOUNTER — Ambulatory Visit: Payer: Self-pay

## 2020-04-01 VITALS — BP 113/83 | HR 60 | Temp 98.3°F | Resp 17 | Ht 62.0 in | Wt 130.0 lb

## 2020-04-01 DIAGNOSIS — R634 Abnormal weight loss: Secondary | ICD-10-CM

## 2020-04-01 DIAGNOSIS — R630 Anorexia: Secondary | ICD-10-CM

## 2020-04-01 DIAGNOSIS — C180 Malignant neoplasm of cecum: Secondary | ICD-10-CM | POA: Diagnosis not present

## 2020-04-01 DIAGNOSIS — D500 Iron deficiency anemia secondary to blood loss (chronic): Secondary | ICD-10-CM

## 2020-04-01 DIAGNOSIS — C787 Secondary malignant neoplasm of liver and intrahepatic bile duct: Secondary | ICD-10-CM

## 2020-04-01 DIAGNOSIS — E876 Hypokalemia: Secondary | ICD-10-CM

## 2020-04-01 DIAGNOSIS — Z95828 Presence of other vascular implants and grafts: Secondary | ICD-10-CM

## 2020-04-01 LAB — CMP (CANCER CENTER ONLY)
ALT: 13 U/L (ref 0–44)
AST: 21 U/L (ref 15–41)
Albumin: 3.1 g/dL — ABNORMAL LOW (ref 3.5–5.0)
Alkaline Phosphatase: 82 U/L (ref 38–126)
Anion gap: 10 (ref 5–15)
BUN: 5 mg/dL — ABNORMAL LOW (ref 6–20)
CO2: 30 mmol/L (ref 22–32)
Calcium: 7.2 mg/dL — ABNORMAL LOW (ref 8.9–10.3)
Chloride: 101 mmol/L (ref 98–111)
Creatinine: 0.51 mg/dL (ref 0.44–1.00)
GFR, Est AFR Am: >60 mL/min (ref >60)
GFR, Estimated: >60 mL/min (ref >60)
Glucose, Bld: 92 mg/dL (ref 70–99)
Potassium: 2.9 mmol/L — CL (ref 3.5–5.1)
Sodium: 141 mmol/L (ref 135–145)
Total Bilirubin: 0.6 mg/dL (ref 0.3–1.2)
Total Protein: 6 g/dL — ABNORMAL LOW (ref 6.5–8.1)

## 2020-04-01 LAB — CEA (IN HOUSE-CHCC): CEA (CHCC-In House): 77.42 ng/mL — ABNORMAL HIGH (ref 0.00–5.00)

## 2020-04-01 LAB — CBC WITH DIFFERENTIAL (CANCER CENTER ONLY)
Abs Immature Granulocytes: 0.03 10*3/uL (ref 0.00–0.07)
Basophils Absolute: 0.1 10*3/uL (ref 0.0–0.1)
Basophils Relative: 1 %
Eosinophils Absolute: 0.2 10*3/uL (ref 0.0–0.5)
Eosinophils Relative: 3 %
HCT: 31.2 % — ABNORMAL LOW (ref 36.0–46.0)
Hemoglobin: 10.1 g/dL — ABNORMAL LOW (ref 12.0–15.0)
Immature Granulocytes: 0 %
Lymphocytes Relative: 39 %
Lymphs Abs: 3.4 10*3/uL (ref 0.7–4.0)
MCH: 32.6 pg (ref 26.0–34.0)
MCHC: 32.4 g/dL (ref 30.0–36.0)
MCV: 100.6 fL — ABNORMAL HIGH (ref 80.0–100.0)
Monocytes Absolute: 0.5 10*3/uL (ref 0.1–1.0)
Monocytes Relative: 6 %
Neutro Abs: 4.3 10*3/uL (ref 1.7–7.7)
Neutrophils Relative %: 51 %
Platelet Count: 314 10*3/uL (ref 150–400)
RBC: 3.1 MIL/uL — ABNORMAL LOW (ref 3.87–5.11)
RDW: 15.4 % (ref 11.5–15.5)
WBC Count: 8.6 10*3/uL (ref 4.0–10.5)
nRBC: 0 % (ref 0.0–0.2)

## 2020-04-01 LAB — IRON AND TIBC
Iron: 109 ug/dL (ref 41–142)
Saturation Ratios: 70 % — ABNORMAL HIGH (ref 21–57)
TIBC: 156 ug/dL — ABNORMAL LOW (ref 236–444)
UIBC: 47 ug/dL — ABNORMAL LOW (ref 120–384)

## 2020-04-01 LAB — FERRITIN: Ferritin: 2268 ng/mL — ABNORMAL HIGH (ref 11–307)

## 2020-04-01 MED ORDER — SODIUM CHLORIDE 0.9% FLUSH
10.0000 mL | Freq: Once | INTRAVENOUS | Status: AC
  Administered 2020-04-01: 10 mL via INTRAVENOUS
  Filled 2020-04-01: qty 10

## 2020-04-01 MED ORDER — HEPARIN SOD (PORK) LOCK FLUSH 100 UNIT/ML IV SOLN
500.0000 [IU] | Freq: Once | INTRAVENOUS | Status: AC
  Administered 2020-04-01: 500 [IU] via INTRAVENOUS
  Filled 2020-04-01: qty 5

## 2020-04-01 MED ORDER — POTASSIUM CHLORIDE CRYS ER 20 MEQ PO TBCR
40.0000 meq | EXTENDED_RELEASE_TABLET | Freq: Once | ORAL | Status: AC
  Administered 2020-04-01: 40 meq via ORAL
  Filled 2020-04-01: qty 2

## 2020-04-01 NOTE — Patient Instructions (Signed)
Implanted Port Insertion, Care After °This sheet gives you information about how to care for yourself after your procedure. Your health care provider may also give you more specific instructions. If you have problems or questions, contact your health care provider. °What can I expect after the procedure? °After the procedure, it is common to have: °· Discomfort at the port insertion site. °· Bruising on the skin over the port. This should improve over 3-4 days. °Follow these instructions at home: °Port care °· After your port is placed, you will get a manufacturer's information card. The card has information about your port. Keep this card with you at all times. °· Take care of the port as told by your health care provider. Ask your health care provider if you or a family member can get training for taking care of the port at home. A home health care nurse may also take care of the port. °· Make sure to remember what type of port you have. °Incision care ° °  ° °· Follow instructions from your health care provider about how to take care of your port insertion site. Make sure you: °? Wash your hands with soap and water before and after you change your bandage (dressing). If soap and water are not available, use hand sanitizer. °? Change your dressing as told by your health care provider. °? Leave stitches (sutures), skin glue, or adhesive strips in place. These skin closures may need to stay in place for 2 weeks or longer. If adhesive strip edges start to loosen and curl up, you may trim the loose edges. Do not remove adhesive strips completely unless your health care provider tells you to do that. °· Check your port insertion site every day for signs of infection. Check for: °? Redness, swelling, or pain. °? Fluid or blood. °? Warmth. °? Pus or a bad smell. °Activity °· Return to your normal activities as told by your health care provider. Ask your health care provider what activities are safe for you. °· Do not  lift anything that is heavier than 10 lb (4.5 kg), or the limit that you are told, until your health care provider says that it is safe. °General instructions °· Take over-the-counter and prescription medicines only as told by your health care provider. °· Do not take baths, swim, or use a hot tub until your health care provider approves. Ask your health care provider if you may take showers. You may only be allowed to take sponge baths. °· Do not drive for 24 hours if you were given a sedative during your procedure. °· Wear a medical alert bracelet in case of an emergency. This will tell any health care providers that you have a port. °· Keep all follow-up visits as told by your health care provider. This is important. °Contact a health care provider if: °· You cannot flush your port with saline as directed, or you cannot draw blood from the port. °· You have a fever or chills. °· You have redness, swelling, or pain around your port insertion site. °· You have fluid or blood coming from your port insertion site. °· Your port insertion site feels warm to the touch. °· You have pus or a bad smell coming from the port insertion site. °Get help right away if: °· You have chest pain or shortness of breath. °· You have bleeding from your port that you cannot control. °Summary °· Take care of the port as told by your health   care provider. Keep the manufacturer's information card with you at all times. °· Change your dressing as told by your health care provider. °· Contact a health care provider if you have a fever or chills or if you have redness, swelling, or pain around your port insertion site. °· Keep all follow-up visits as told by your health care provider. °This information is not intended to replace advice given to you by your health care provider. Make sure you discuss any questions you have with your health care provider. °Document Revised: 02/14/2018 Document Reviewed: 02/14/2018 °Elsevier Patient Education ©  2020 [employer] ° °

## 2020-04-01 NOTE — Telephone Encounter (Signed)
Jory Ee NP notified of potassium-2.9.  Order received for pt to take potassium 40 meq po now and then to continue potassium as prescribed at home per S. [place] NP.

## 2020-04-01 NOTE — Progress Notes (Signed)
Hematology and Oncology Follow Up Visit  Anita Hoffman [id number] 01/20/1961 59 y.o. 04/01/2020   Principle Diagnosis:  Metastatic adenocarcinoma of the cecum-liver metastasis Pulmonary emboli and LEFT gastrocnemius vein thrombus HIV-asymptomatic Iron deficiency secondary to GI bleeding  Current Therapy: FOLFOXIRI - startedon 12/12/2019, s/p cycle6 Biktarvy 1 p.o. daily Udenycasq post chemo Lovenox 80 mg subcu twice daily IV ironas indicated   Interim History:  Anita Hoffman is here today for follow-up and treatment. She is feeling good but her weight is down another 2 lbs since her last visit.  She states that she started the Prednisone last week and has noticed an increase in her appetite.  No fever, chills, n/v, cough, rash, dizziness, SOB, chest pain, palpitations, abdominal pain or changes in bowel or bladder habits.  She has occasional fatigue and takes a break to rest when needed.  No falls or syncope.  She denies swelling, tenderness, numbness or tingling in her extremities.  She does feel that she is hydrating well.  No episodes of bleeding. No bruising or petechiae.   ECOG Performance Status: 1 - Symptomatic but completely ambulatory  Medications:  Allergies as of 04/01/2020   No Known Allergies     Medication List       Accurate as of April 01, 2020  9:26 AM. If you have any questions, ask your nurse or doctor.        apixaban 5 MG Tabs tablet Commonly known as: ELIQUIS Take 1 tablet (5 mg total) by mouth 2 (two) times daily.   Biktarvy 50-200-25 MG Tabs tablet Generic drug: bictegravir-emtricitabine-tenofovir AF Take 1 tablet by mouth daily.   dexamethasone 4 MG tablet Commonly known as: DECADRON Take 2 tablets (8 mg total) by mouth daily. Start the day after chemotherapy for 3 days. Take with food.   enoxaparin 80 MG/0.8ML injection Commonly known as: LOVENOX Inject 0.8 mLs (80 mg total) into the skin 2 (two) times daily.   folic  acid 1 MG tablet Commonly known as: FOLVITE Take 2 tablets (2 mg total) by mouth daily.   lidocaine-prilocaine cream Commonly known as: EMLA Apply to affected area once   loperamide 2 MG tablet Commonly known as: Imodium A-D Take 2 at onset of diarrhea, then 1 every 2hrs until 12hr without a BM. May take 2 tab every 4hrs at bedtime. If diarrhea recurs repeat.   LORazepam 0.5 MG tablet Commonly known as: Ativan Take 1 tablet (0.5 mg total) by mouth every 6 (six) hours as needed for anxiety.   ondansetron 8 MG tablet Commonly known as: Zofran Take 1 tablet (8 mg total) by mouth 2 (two) times daily as needed. Start on day 3 after chemotherapy.   pantoprazole 40 MG tablet Commonly known as: PROTONIX Take 1 tablet (40 mg total) by mouth 2 (two) times daily.   potassium chloride SA 20 MEQ tablet Commonly known as: KLOR-CON Take 2 tablets (40 mEq total) by mouth 2 (two) times daily.   predniSONE 10 MG tablet Commonly known as: DELTASONE Take 1 tablet (10 mg total) by mouth daily with breakfast.   prochlorperazine 10 MG tablet Commonly known as: COMPAZINE Take 1 tablet (10 mg total) by mouth every 6 (six) hours as needed (Nausea or vomiting).   traMADol 50 MG tablet Commonly known as: ULTRAM Take 1 tablet (50 mg total) by mouth every 12 (twelve) hours as needed.       Allergies: No Known Allergies  Past Medical History, Surgical history, Social history, and Family History  were reviewed and updated.  Review of Systems: All other 10 point review of systems is negative.   Physical Exam:  vitals were not taken for this visit.   Wt Readings from Last 3 Encounters:  03/18/20 132 lb (59.9 kg)  03/04/20 133 lb 1.9 oz (60.4 kg)  02/19/20 143 lb (64.9 kg)    Ocular: Sclerae unicteric, pupils equal, round and reactive to light Ear-nose-throat: Oropharynx clear, dentition fair Lymphatic: No cervical or supraclavicular adenopathy Lungs no rales or rhonchi, good excursion  bilaterally Heart regular rate and rhythm, no murmur appreciated Abd soft, nontender, positive bowel sounds, no liver or spleen tip palpated on exam, no fluid wave  MSK no focal spinal tenderness, no joint edema Neuro: non-focal, well-oriented, appropriate affect Breasts: Deferred   Lab Results  Component Value Date   WBC 9.2 03/18/2020   HGB 10.6 (L) 03/18/2020   HCT 31.9 (L) 03/18/2020   MCV 94.9 03/18/2020   PLT 337 03/18/2020   Lab Results  Component Value Date   FERRITIN 2,873 (H) 03/18/2020   IRON 84 03/18/2020   TIBC 163 (L) 03/18/2020   UIBC 78 (L) 03/18/2020   IRONPCTSAT 52 03/18/2020   Lab Results  Component Value Date   RBC 3.36 (L) 03/18/2020   No results found for: KPAFRELGTCHN, LAMBDASER, KAPLAMBRATIO No results found for: IGGSERUM, IGA, IGMSERUM No results found for: Ronnald Ramp, A1GS, A2GS, Violet Baldy, MSPIKE, SPEI   Chemistry      Component Value Date/Time   NA 138 03/18/2020 0845   K 2.6 (LL) 03/18/2020 0845   CL 97 (L) 03/18/2020 0845   CO2 29 03/18/2020 0845   BUN 6 03/18/2020 0845   CREATININE 0.52 03/18/2020 0845   CREATININE 0.59 01/03/2020 1216      Component Value Date/Time   CALCIUM 7.0 (L) 03/18/2020 0845   ALKPHOS 116 03/18/2020 0845   AST 18 03/18/2020 0845   ALT 6 03/18/2020 0845   BILITOT 0.5 03/18/2020 0845       Impression and Plan: AnitaWilliamsis a verypleasant59yoAfrican American with metastatic colon cancer primarily ofthe cecum withextensive liver metastasis. CEA is down to 98!!! She continues to lose weight. I discussed this with Dr. Marin Olp and we will give her one more week off and plan to re evaluate after labor day.  She will continue her daily prednisone and will add 1 Boost or Ensure protein drink daily.  We will give her 40 meq of KDUR while she is here today and she will take her evening dose as prescribed this evening.  She is in agreement and will contact our office with any questions  or concerns.   Laverna Peace, NP 8/31/20219:26 AM

## 2020-04-01 NOTE — Addendum Note (Signed)
Addended by: Lucile Crater on: 04/01/2020 10:27 AM   Modules accepted: Orders

## 2020-04-03 ENCOUNTER — Inpatient Hospital Stay: Payer: Medicaid Other

## 2020-04-08 ENCOUNTER — Inpatient Hospital Stay: Payer: Medicaid Other

## 2020-04-08 ENCOUNTER — Inpatient Hospital Stay (HOSPITAL_BASED_OUTPATIENT_CLINIC_OR_DEPARTMENT_OTHER): Payer: Medicaid Other | Admitting: Family

## 2020-04-08 ENCOUNTER — Telehealth: Payer: Self-pay | Admitting: Family

## 2020-04-08 ENCOUNTER — Other Ambulatory Visit: Payer: Self-pay

## 2020-04-08 ENCOUNTER — Inpatient Hospital Stay: Payer: Medicaid Other | Attending: Hematology & Oncology

## 2020-04-08 ENCOUNTER — Encounter: Payer: Self-pay | Admitting: Family

## 2020-04-08 VITALS — BP 125/84 | HR 92 | Temp 98.6°F | Resp 18 | Ht 62.0 in | Wt 128.1 lb

## 2020-04-08 DIAGNOSIS — S0081XA Abrasion of other part of head, initial encounter: Secondary | ICD-10-CM | POA: Diagnosis not present

## 2020-04-08 DIAGNOSIS — Z7901 Long term (current) use of anticoagulants: Secondary | ICD-10-CM | POA: Diagnosis not present

## 2020-04-08 DIAGNOSIS — I2699 Other pulmonary embolism without acute cor pulmonale: Secondary | ICD-10-CM | POA: Diagnosis not present

## 2020-04-08 DIAGNOSIS — C787 Secondary malignant neoplasm of liver and intrahepatic bile duct: Secondary | ICD-10-CM

## 2020-04-08 DIAGNOSIS — W19XXXA Unspecified fall, initial encounter: Secondary | ICD-10-CM | POA: Insufficient documentation

## 2020-04-08 DIAGNOSIS — K644 Residual hemorrhoidal skin tags: Secondary | ICD-10-CM | POA: Diagnosis not present

## 2020-04-08 DIAGNOSIS — I82462 Acute embolism and thrombosis of left calf muscular vein: Secondary | ICD-10-CM | POA: Diagnosis not present

## 2020-04-08 DIAGNOSIS — Z79899 Other long term (current) drug therapy: Secondary | ICD-10-CM | POA: Insufficient documentation

## 2020-04-08 DIAGNOSIS — Z21 Asymptomatic human immunodeficiency virus [HIV] infection status: Secondary | ICD-10-CM | POA: Diagnosis not present

## 2020-04-08 DIAGNOSIS — Z5111 Encounter for antineoplastic chemotherapy: Secondary | ICD-10-CM | POA: Insufficient documentation

## 2020-04-08 DIAGNOSIS — Z5189 Encounter for other specified aftercare: Secondary | ICD-10-CM | POA: Diagnosis not present

## 2020-04-08 DIAGNOSIS — K922 Gastrointestinal hemorrhage, unspecified: Secondary | ICD-10-CM | POA: Diagnosis not present

## 2020-04-08 DIAGNOSIS — D122 Benign neoplasm of ascending colon: Secondary | ICD-10-CM

## 2020-04-08 DIAGNOSIS — K625 Hemorrhage of anus and rectum: Secondary | ICD-10-CM | POA: Insufficient documentation

## 2020-04-08 DIAGNOSIS — D490 Neoplasm of unspecified behavior of digestive system: Secondary | ICD-10-CM

## 2020-04-08 DIAGNOSIS — C180 Malignant neoplasm of cecum: Secondary | ICD-10-CM | POA: Diagnosis present

## 2020-04-08 DIAGNOSIS — D500 Iron deficiency anemia secondary to blood loss (chronic): Secondary | ICD-10-CM | POA: Insufficient documentation

## 2020-04-08 LAB — CMP (CANCER CENTER ONLY)
ALT: 13 U/L (ref 0–44)
AST: 16 U/L (ref 15–41)
Albumin: 3 g/dL — ABNORMAL LOW (ref 3.5–5.0)
Alkaline Phosphatase: 96 U/L (ref 38–126)
Anion gap: 11 (ref 5–15)
BUN: 4 mg/dL — ABNORMAL LOW (ref 6–20)
CO2: 25 mmol/L (ref 22–32)
Calcium: 8 mg/dL — ABNORMAL LOW (ref 8.9–10.3)
Chloride: 102 mmol/L (ref 98–111)
Creatinine: 0.61 mg/dL (ref 0.44–1.00)
GFR, Est AFR Am: >60 mL/min (ref >60)
GFR, Estimated: >60 mL/min (ref >60)
Glucose, Bld: 91 mg/dL (ref 70–99)
Potassium: 3.9 mmol/L (ref 3.5–5.1)
Sodium: 138 mmol/L (ref 135–145)
Total Bilirubin: 0.5 mg/dL (ref 0.3–1.2)
Total Protein: 6 g/dL — ABNORMAL LOW (ref 6.5–8.1)

## 2020-04-08 LAB — CBC WITH DIFFERENTIAL (CANCER CENTER ONLY)
Abs Immature Granulocytes: 0.03 10*3/uL (ref 0.00–0.07)
Basophils Absolute: 0.1 10*3/uL (ref 0.0–0.1)
Basophils Relative: 1 %
Eosinophils Absolute: 0.2 10*3/uL (ref 0.0–0.5)
Eosinophils Relative: 2 %
HCT: 31.9 % — ABNORMAL LOW (ref 36.0–46.0)
Hemoglobin: 10.4 g/dL — ABNORMAL LOW (ref 12.0–15.0)
Immature Granulocytes: 0 %
Lymphocytes Relative: 38 %
Lymphs Abs: 4 10*3/uL (ref 0.7–4.0)
MCH: 33.8 pg (ref 26.0–34.0)
MCHC: 32.6 g/dL (ref 30.0–36.0)
MCV: 103.6 fL — ABNORMAL HIGH (ref 80.0–100.0)
Monocytes Absolute: 0.6 10*3/uL (ref 0.1–1.0)
Monocytes Relative: 5 %
Neutro Abs: 5.8 10*3/uL (ref 1.7–7.7)
Neutrophils Relative %: 54 %
Platelet Count: 292 10*3/uL (ref 150–400)
RBC: 3.08 MIL/uL — ABNORMAL LOW (ref 3.87–5.11)
RDW: 14.5 % (ref 11.5–15.5)
WBC Count: 10.6 10*3/uL — ABNORMAL HIGH (ref 4.0–10.5)
nRBC: 0 % (ref 0.0–0.2)

## 2020-04-08 MED ORDER — SODIUM CHLORIDE 0.9 % IV SOLN
10.0000 mg | Freq: Once | INTRAVENOUS | Status: AC
  Administered 2020-04-08: 10 mg via INTRAVENOUS
  Filled 2020-04-08: qty 10

## 2020-04-08 MED ORDER — DEXTROSE 5 % IV SOLN
Freq: Once | INTRAVENOUS | Status: AC
  Filled 2020-04-08: qty 250

## 2020-04-08 MED ORDER — ATROPINE SULFATE 1 MG/ML IJ SOLN
0.5000 mg | Freq: Once | INTRAMUSCULAR | Status: AC | PRN
  Administered 2020-04-08: 0.5 mg via INTRAVENOUS

## 2020-04-08 MED ORDER — OXALIPLATIN CHEMO INJECTION 100 MG/20ML
115.0000 mg | Freq: Once | INTRAVENOUS | Status: AC
  Administered 2020-04-08: 115 mg via INTRAVENOUS
  Filled 2020-04-08: qty 20

## 2020-04-08 MED ORDER — SODIUM CHLORIDE 0.9% FLUSH
10.0000 mL | INTRAVENOUS | Status: DC | PRN
  Filled 2020-04-08: qty 10

## 2020-04-08 MED ORDER — SODIUM CHLORIDE 0.9 % IV SOLN
4600.0000 mg | INTRAVENOUS | Status: DC
  Administered 2020-04-08: 4600 mg via INTRAVENOUS
  Filled 2020-04-08: qty 92

## 2020-04-08 MED ORDER — PALONOSETRON HCL INJECTION 0.25 MG/5ML
0.2500 mg | Freq: Once | INTRAVENOUS | Status: AC
  Administered 2020-04-08: 0.25 mg via INTRAVENOUS

## 2020-04-08 MED ORDER — LEUCOVORIN CALCIUM INJECTION 350 MG
320.0000 mg | Freq: Once | INTRAVENOUS | Status: AC
  Administered 2020-04-08: 320 mg via INTRAVENOUS
  Filled 2020-04-08: qty 16

## 2020-04-08 MED ORDER — PALONOSETRON HCL INJECTION 0.25 MG/5ML
INTRAVENOUS | Status: AC
  Filled 2020-04-08: qty 5

## 2020-04-08 MED ORDER — SODIUM CHLORIDE 0.9 % IV SOLN
150.0000 mg | Freq: Once | INTRAVENOUS | Status: AC
  Administered 2020-04-08: 150 mg via INTRAVENOUS
  Filled 2020-04-08: qty 150

## 2020-04-08 MED ORDER — ATROPINE SULFATE 1 MG/ML IJ SOLN
INTRAMUSCULAR | Status: AC
  Filled 2020-04-08: qty 1

## 2020-04-08 MED ORDER — HEPARIN SOD (PORK) LOCK FLUSH 100 UNIT/ML IV SOLN
500.0000 [IU] | Freq: Once | INTRAVENOUS | Status: DC | PRN
  Filled 2020-04-08: qty 5

## 2020-04-08 MED ORDER — SODIUM CHLORIDE 0.9 % IV SOLN
220.0000 mg | Freq: Once | INTRAVENOUS | Status: AC
  Administered 2020-04-08: 220 mg via INTRAVENOUS
  Filled 2020-04-08: qty 6

## 2020-04-08 NOTE — Patient Instructions (Signed)
Implanted Port Insertion, Care After °This sheet gives you information about how to care for yourself after your procedure. Your health care provider may also give you more specific instructions. If you have problems or questions, contact your health care provider. °What can I expect after the procedure? °After the procedure, it is common to have: °· Discomfort at the port insertion site. °· Bruising on the skin over the port. This should improve over 3-4 days. °Follow these instructions at home: °Port care °· After your port is placed, you will get a manufacturer's information card. The card has information about your port. Keep this card with you at all times. °· Take care of the port as told by your health care provider. Ask your health care provider if you or a family member can get training for taking care of the port at home. A home health care nurse may also take care of the port. °· Make sure to remember what type of port you have. °Incision care ° °  ° °· Follow instructions from your health care provider about how to take care of your port insertion site. Make sure you: °? Wash your hands with soap and water before and after you change your bandage (dressing). If soap and water are not available, use hand sanitizer. °? Change your dressing as told by your health care provider. °? Leave stitches (sutures), skin glue, or adhesive strips in place. These skin closures may need to stay in place for 2 weeks or longer. If adhesive strip edges start to loosen and curl up, you may trim the loose edges. Do not remove adhesive strips completely unless your health care provider tells you to do that. °· Check your port insertion site every day for signs of infection. Check for: °? Redness, swelling, or pain. °? Fluid or blood. °? Warmth. °? Pus or a bad smell. °Activity °· Return to your normal activities as told by your health care provider. Ask your health care provider what activities are safe for you. °· Do not  lift anything that is heavier than 10 lb (4.5 kg), or the limit that you are told, until your health care provider says that it is safe. °General instructions °· Take over-the-counter and prescription medicines only as told by your health care provider. °· Do not take baths, swim, or use a hot tub until your health care provider approves. Ask your health care provider if you may take showers. You may only be allowed to take sponge baths. °· Do not drive for 24 hours if you were given a sedative during your procedure. °· Wear a medical alert bracelet in case of an emergency. This will tell any health care providers that you have a port. °· Keep all follow-up visits as told by your health care provider. This is important. °Contact a health care provider if: °· You cannot flush your port with saline as directed, or you cannot draw blood from the port. °· You have a fever or chills. °· You have redness, swelling, or pain around your port insertion site. °· You have fluid or blood coming from your port insertion site. °· Your port insertion site feels warm to the touch. °· You have pus or a bad smell coming from the port insertion site. °Get help right away if: °· You have chest pain or shortness of breath. °· You have bleeding from your port that you cannot control. °Summary °· Take care of the port as told by your health   care provider. Keep the manufacturer's information card with you at all times. °· Change your dressing as told by your health care provider. °· Contact a health care provider if you have a fever or chills or if you have redness, swelling, or pain around your port insertion site. °· Keep all follow-up visits as told by your health care provider. °This information is not intended to replace advice given to you by your health care provider. Make sure you discuss any questions you have with your health care provider. °Document Revised: 02/14/2018 Document Reviewed: 02/14/2018 °Elsevier Patient Education ©  2020 [employer] ° °

## 2020-04-08 NOTE — Progress Notes (Signed)
Patient with weight loss. Doses changed to reflect current weight per Dr. Antonieta Pert instructions.

## 2020-04-08 NOTE — Patient Instructions (Signed)
Leucovorin injection What is this medicine? LEUCOVORIN (loo koe VOR in) is used to prevent or treat the harmful effects of some medicines. This medicine is used to treat anemia caused by a low amount of folic acid in the body. It is also used with 5-fluorouracil (5-FU) to treat colon cancer. This medicine may be used for other purposes; ask your health care provider or pharmacist if you have questions. What should I tell my health care provider before I take this medicine? They need to know if you have any of these conditions:  anemia from low levels of vitamin B-12 in the blood  an unusual or allergic reaction to leucovorin, folic acid, other medicines, foods, dyes, or preservatives  pregnant or trying to get pregnant  breast-feeding How should I use this medicine? This medicine is for injection into a muscle or into a vein. It is given by a health care professional in a hospital or clinic setting. Talk to your pediatrician regarding the use of this medicine in children. Special care may be needed. Overdosage: If you think you have taken too much of this medicine contact a poison control center or emergency room at once. NOTE: This medicine is only for you. Do not share this medicine with others. What if I miss a dose? This does not apply. What may interact with this medicine?  capecitabine  fluorouracil  phenobarbital  phenytoin  primidone  trimethoprim-sulfamethoxazole This list may not describe all possible interactions. Give your health care provider a list of all the medicines, herbs, non-prescription drugs, or dietary supplements you use. Also tell them if you smoke, drink alcohol, or use illegal drugs. Some items may interact with your medicine. What should I watch for while using this medicine? Your condition will be monitored carefully while you are receiving this medicine. This medicine may increase the side effects of 5-fluorouracil, 5-FU. Tell your doctor or health  care professional if you have diarrhea or mouth sores that do not get better or that get worse. What side effects may I notice from receiving this medicine? Side effects that you should report to your doctor or health care professional as soon as possible:  allergic reactions like skin rash, itching or hives, swelling of the face, lips, or tongue  breathing problems  fever, infection  mouth sores  unusual bleeding or bruising  unusually weak or tired Side effects that usually do not require medical attention (report to your doctor or health care professional if they continue or are bothersome):  constipation or diarrhea  loss of appetite  nausea, vomiting This list may not describe all possible side effects. Call your doctor for medical advice about side effects. You may report side effects to FDA at 1-800-FDA-1088. Where should I keep my medicine? This drug is given in a hospital or clinic and will not be stored at home. NOTE: This sheet is a summary. It may not cover all possible information. If you have questions about this medicine, talk to your doctor, pharmacist, or health care provider.  2020 Elsevier/Gold Standard (2008-01-23 16:50:29) Oxaliplatin Injection What is this medicine? OXALIPLATIN (ox AL i PLA tin) is a chemotherapy drug. It targets fast dividing cells, like cancer cells, and causes these cells to die. This medicine is used to treat cancers of the colon and rectum, and many other cancers. This medicine may be used for other purposes; ask your health care provider or pharmacist if you have questions. COMMON BRAND NAME(S): Eloxatin What should I tell my health  care provider before I take this medicine? They need to know if you have any of these conditions:  heart disease  history of irregular heartbeat  liver disease  low blood counts, like white cells, platelets, or red blood cells  lung or breathing disease, like asthma  take medicines that treat or  prevent blood clots  tingling of the fingers or toes, or other nerve disorder  an unusual or allergic reaction to oxaliplatin, other chemotherapy, other medicines, foods, dyes, or preservatives  pregnant or trying to get pregnant  breast-feeding How should I use this medicine? This drug is given as an infusion into a vein. It is administered in a hospital or clinic by a specially trained health care professional. Talk to your pediatrician regarding the use of this medicine in children. Special care may be needed. Overdosage: If you think you have taken too much of this medicine contact a poison control center or emergency room at once. NOTE: This medicine is only for you. Do not share this medicine with others. What if I miss a dose? It is important not to miss a dose. Call your doctor or health care professional if you are unable to keep an appointment. What may interact with this medicine? Do not take this medicine with any of the following medications:  cisapride  dronedarone  pimozide  thioridazine This medicine may also interact with the following medications:  aspirin and aspirin-like medicines  certain medicines that treat or prevent blood clots like warfarin, apixaban, dabigatran, and rivaroxaban  cisplatin  cyclosporine  diuretics  medicines for infection like acyclovir, adefovir, amphotericin B, bacitracin, cidofovir, foscarnet, ganciclovir, gentamicin, pentamidine, vancomycin  NSAIDs, medicines for pain and inflammation, like ibuprofen or naproxen  other medicines that prolong the QT interval (an abnormal heart rhythm)  pamidronate  zoledronic acid This list may not describe all possible interactions. Give your health care provider a list of all the medicines, herbs, non-prescription drugs, or dietary supplements you use. Also tell them if you smoke, drink alcohol, or use illegal drugs. Some items may interact with your medicine. What should I watch for  while using this medicine? Your condition will be monitored carefully while you are receiving this medicine. You may need blood work done while you are taking this medicine. This medicine may make you feel generally unwell. This is not uncommon as chemotherapy can affect healthy cells as well as cancer cells. Report any side effects. Continue your course of treatment even though you feel ill unless your healthcare professional tells you to stop. This medicine can make you more sensitive to cold. Do not drink cold drinks or use ice. Cover exposed skin before coming in contact with cold temperatures or cold objects. When out in cold weather wear warm clothing and cover your mouth and nose to warm the air that goes into your lungs. Tell your doctor if you get sensitive to the cold. Do not become pregnant while taking this medicine or for 9 months after stopping it. Women should inform their health care professional if they wish to become pregnant or think they might be pregnant. Men should not father a child while taking this medicine and for 6 months after stopping it. There is potential for serious side effects to an unborn child. Talk to your health care professional for more information. Do not breast-feed a child while taking this medicine or for 3 months after stopping it. This medicine has caused ovarian failure in some women. This medicine may make it  more difficult to get pregnant. Talk to your health care professional if you are concerned about your fertility. This medicine has caused decreased sperm counts in some men. This may make it more difficult to father a child. Talk to your health care professional if you are concerned about your fertility. This medicine may increase your risk of getting an infection. Call your health care professional for advice if you get a fever, chills, or sore throat, or other symptoms of a cold or flu. Do not treat yourself. Try to avoid being around people who are  sick. Avoid taking medicines that contain aspirin, acetaminophen, ibuprofen, naproxen, or ketoprofen unless instructed by your health care professional. These medicines may hide a fever. Be careful brushing or flossing your teeth or using a toothpick because you may get an infection or bleed more easily. If you have any dental work done, tell your dentist you are receiving this medicine. What side effects may I notice from receiving this medicine? Side effects that you should report to your doctor or health care professional as soon as possible:  allergic reactions like skin rash, itching or hives, swelling of the face, lips, or tongue  breathing problems  cough  low blood counts - this medicine may decrease the number of white blood cells, red blood cells, and platelets. You may be at increased risk for infections and bleeding  nausea, vomiting  pain, redness, or irritation at site where injected  pain, tingling, numbness in the hands or feet  signs and symptoms of bleeding such as bloody or black, tarry stools; red or dark brown urine; spitting up blood or brown material that looks like coffee grounds; red spots on the skin; unusual bruising or bleeding from the eyes, gums, or nose  signs and symptoms of a dangerous change in heartbeat or heart rhythm like chest pain; dizziness; fast, irregular heartbeat; palpitations; feeling faint or lightheaded; falls  signs and symptoms of infection like fever; chills; cough; sore throat; pain or trouble passing urine  signs and symptoms of liver injury like dark yellow or brown urine; general ill feeling or flu-like symptoms; light-colored stools; loss of appetite; nausea; right upper belly pain; unusually weak or tired; yellowing of the eyes or skin  signs and symptoms of low red blood cells or anemia such as unusually weak or tired; feeling faint or lightheaded; falls  signs and symptoms of muscle injury like dark urine; trouble passing urine  or change in the amount of urine; unusually weak or tired; muscle pain; back pain Side effects that usually do not require medical attention (report to your doctor or health care professional if they continue or are bothersome):  changes in taste  diarrhea  gas  hair loss  loss of appetite  mouth sores This list may not describe all possible side effects. Call your doctor for medical advice about side effects. You may report side effects to FDA at 1-800-FDA-1088. Where should I keep my medicine? This drug is given in a hospital or clinic and will not be stored at home. NOTE: This sheet is a summary. It may not cover all possible information. If you have questions about this medicine, talk to your doctor, pharmacist, or health care provider.  2020 Elsevier/Gold Standard (2018-12-06 12:20:35) Irinotecan injection What is this medicine? IRINOTECAN (ir in oh TEE kan ) is a chemotherapy drug. It is used to treat colon and rectal cancer. This medicine may be used for other purposes; ask your health care provider or  pharmacist if you have questions. COMMON BRAND NAME(S): Camptosar What should I tell my health care provider before I take this medicine? They need to know if you have any of these conditions:  dehydration  diarrhea  infection (especially a virus infection such as chickenpox, cold sores, or herpes)  liver disease  low blood counts, like low white cell, platelet, or red cell counts  low levels of calcium, magnesium, or potassium in the blood  recent or ongoing radiation therapy  an unusual or allergic reaction to irinotecan, other medicines, foods, dyes, or preservatives  pregnant or trying to get pregnant  breast-feeding How should I use this medicine? This drug is given as an infusion into a vein. It is administered in a hospital or clinic by a specially trained health care professional. Talk to your pediatrician regarding the use of this medicine in children.  Special care may be needed. Overdosage: If you think you have taken too much of this medicine contact a poison control center or emergency room at once. NOTE: This medicine is only for you. Do not share this medicine with others. What if I miss a dose? It is important not to miss your dose. Call your doctor or health care professional if you are unable to keep an appointment. What may interact with this medicine? This medicine may interact with the following medications:  antiviral medicines for HIV or AIDS  certain antibiotics like rifampin or rifabutin  certain medicines for fungal infections like itraconazole, ketoconazole, posaconazole, and voriconazole  certain medicines for seizures like carbamazepine, phenobarbital, phenotoin  clarithromycin  gemfibrozil  nefazodone  St. John's Wort This list may not describe all possible interactions. Give your health care provider a list of all the medicines, herbs, non-prescription drugs, or dietary supplements you use. Also tell them if you smoke, drink alcohol, or use illegal drugs. Some items may interact with your medicine. What should I watch for while using this medicine? Your condition will be monitored carefully while you are receiving this medicine. You will need important blood work done while you are taking this medicine. This drug may make you feel generally unwell. This is not uncommon, as chemotherapy can affect healthy cells as well as cancer cells. Report any side effects. Continue your course of treatment even though you feel ill unless your doctor tells you to stop. In some cases, you may be given additional medicines to help with side effects. Follow all directions for their use. You may get drowsy or dizzy. Do not drive, use machinery, or do anything that needs mental alertness until you know how this medicine affects you. Do not stand or sit up quickly, especially if you are an older patient. This reduces the risk of dizzy  or fainting spells. Call your health care professional for advice if you get a fever, chills, or sore throat, or other symptoms of a cold or flu. Do not treat yourself. This medicine decreases your body's ability to fight infections. Try to avoid being around people who are sick. Avoid taking products that contain aspirin, acetaminophen, ibuprofen, naproxen, or ketoprofen unless instructed by your doctor. These medicines may hide a fever. This medicine may increase your risk to bruise or bleed. Call your doctor or health care professional if you notice any unusual bleeding. Be careful brushing and flossing your teeth or using a toothpick because you may get an infection or bleed more easily. If you have any dental work done, tell your dentist you are receiving this medicine.  Do not become pregnant while taking this medicine or for 6 months after stopping it. Women should inform their health care professional if they wish to become pregnant or think they might be pregnant. Men should not father a child while taking this medicine and for 3 months after stopping it. There is potential for serious side effects to an unborn child. Talk to your health care professional for more information. Do not breast-feed an infant while taking this medicine or for 7 days after stopping it. This medicine has caused ovarian failure in some women. This medicine may make it more difficult to get pregnant. Talk to your health care professional if you are concerned about your fertility. This medicine has caused decreased sperm counts in some men. This may make it more difficult to father a child. Talk to your health care professional if you are concerned about your fertility. What side effects may I notice from receiving this medicine? Side effects that you should report to your doctor or health care professional as soon as possible:  allergic reactions like skin rash, itching or hives, swelling of the face, lips, or  tongue  chest pain  diarrhea  flushing, runny nose, sweating during infusion  low blood counts - this medicine may decrease the number of white blood cells, red blood cells and platelets. You may be at increased risk for infections and bleeding.  nausea, vomiting  pain, swelling, warmth in the leg  signs of decreased platelets or bleeding - bruising, pinpoint red spots on the skin, black, tarry stools, blood in the urine  signs of infection - fever or chills, cough, sore throat, pain or difficulty passing urine  signs of decreased red blood cells - unusually weak or tired, fainting spells, lightheadedness Side effects that usually do not require medical attention (report to your doctor or health care professional if they continue or are bothersome):  constipation  hair loss  headache  loss of appetite  mouth sores  stomach pain This list may not describe all possible side effects. Call your doctor for medical advice about side effects. You may report side effects to FDA at 1-800-FDA-1088. Where should I keep my medicine? This drug is given in a hospital or clinic and will not be stored at home. NOTE: This sheet is a summary. It may not cover all possible information. If you have questions about this medicine, talk to your doctor, pharmacist, or health care provider.  2020 Elsevier/Gold Standard (2018-09-08 10:09:17)

## 2020-04-08 NOTE — Telephone Encounter (Signed)
Appointments scheduled and I gave patient an updated schedule while she was in infusion. Per 9/7 los

## 2020-04-08 NOTE — Progress Notes (Signed)
Hematology and Oncology Follow Up Visit  Anita Hoffman [id number] [birth year] 59 y.o. 04/08/2020   Principle Diagnosis:  Metastatic adenocarcinoma of the cecum-liver metastasis Pulmonary emboli and LEFT gastrocnemius vein thrombus HIV-asymptomatic Iron deficiency secondary to GI bleeding  Current Therapy: FOLFOXIRI - startedon 12/12/2019, s/p cycle5 Biktarvy 1 p.o. daily Udenycasq post chemo Lovenox 80 mg subcu twice daily IV ironas indicated   Interim History:  Anita Hoffman is here today for follow-up and treatment. She fell last week in a pair of sandals that she since threw away. She has a bruise under her bottom lip and an abrasion to her left forehead that has healed nicely. Thankfully she was not more seriously injured.  No fever, chills, n/v, cough, rash, dizziness, SOB, chest pain, palpitations, abdominal pain or changes in bowel or bladder habits.  No episodes of bleeding. No bruising or petechiae.  No swelling, tenderness, numbness or tingling in her extremities.  No syncope.  She states that she has a great appetite on the prednisone. She is also drinking a Boost every day. She is staying well hydrated. Her weight is down 2 lbs at 128 lbs.   ECOG Performance Status: 1 - Symptomatic but completely ambulatory  Medications:  Allergies as of 04/08/2020   No Known Allergies     Medication List       Accurate as of April 08, 2020  9:39 AM. If you have any questions, ask your nurse or doctor.        apixaban 5 MG Tabs tablet Commonly known as: ELIQUIS Take 1 tablet (5 mg total) by mouth 2 (two) times daily.   Biktarvy 50-200-25 MG Tabs tablet Generic drug: bictegravir-emtricitabine-tenofovir AF Take 1 tablet by mouth daily.   dexamethasone 4 MG tablet Commonly known as: DECADRON Take 2 tablets (8 mg total) by mouth daily. Start the day after chemotherapy for 3 days. Take with food.   enoxaparin 80 MG/0.8ML injection Commonly known as:  LOVENOX Inject 0.8 mLs (80 mg total) into the skin 2 (two) times daily.   folic acid 1 MG tablet Commonly known as: FOLVITE Take 2 tablets (2 mg total) by mouth daily.   lidocaine-prilocaine cream Commonly known as: EMLA Apply to affected area once   loperamide 2 MG tablet Commonly known as: Imodium A-D Take 2 at onset of diarrhea, then 1 every 2hrs until 12hr without a BM. May take 2 tab every 4hrs at bedtime. If diarrhea recurs repeat.   LORazepam 0.5 MG tablet Commonly known as: Ativan Take 1 tablet (0.5 mg total) by mouth every 6 (six) hours as needed for anxiety.   ondansetron 8 MG tablet Commonly known as: Zofran Take 1 tablet (8 mg total) by mouth 2 (two) times daily as needed. Start on day 3 after chemotherapy.   pantoprazole 40 MG tablet Commonly known as: PROTONIX Take 1 tablet (40 mg total) by mouth 2 (two) times daily.   potassium chloride SA 20 MEQ tablet Commonly known as: KLOR-CON Take 2 tablets (40 mEq total) by mouth 2 (two) times daily.   predniSONE 10 MG tablet Commonly known as: DELTASONE Take 1 tablet (10 mg total) by mouth daily with breakfast.   prochlorperazine 10 MG tablet Commonly known as: COMPAZINE Take 1 tablet (10 mg total) by mouth every 6 (six) hours as needed (Nausea or vomiting).   traMADol 50 MG tablet Commonly known as: ULTRAM Take 1 tablet (50 mg total) by mouth every 12 (twelve) hours as needed.  Allergies: No Known Allergies  Past Medical History, Surgical history, Social history, and Family History were reviewed and updated.  Review of Systems: All other 10 point review of systems is negative.   Physical Exam:  vitals were not taken for this visit.   Wt Readings from Last 3 Encounters:  04/01/20 130 lb (59 kg)  03/18/20 132 lb (59.9 kg)  03/04/20 133 lb 1.9 oz (60.4 kg)    Ocular: Sclerae unicteric, pupils equal, round and reactive to light Ear-nose-throat: Oropharynx clear, dentition fair Lymphatic: No  cervical or supraclavicular adenopathy Lungs no rales or rhonchi, good excursion bilaterally Heart regular rate and rhythm, no murmur appreciated Abd soft, nontender, positive bowel sounds, no liver or spleen tip palpated on exam, no fluid wave  MSK no focal spinal tenderness, no joint edema Neuro: non-focal, well-oriented, appropriate affect Breasts: Deferred   Lab Results  Component Value Date   WBC 10.6 (H) 04/08/2020   HGB 10.4 (L) 04/08/2020   HCT 31.9 (L) 04/08/2020   MCV 103.6 (H) 04/08/2020   PLT 292 04/08/2020   Lab Results  Component Value Date   FERRITIN 2,268 (H) 04/01/2020   IRON 109 04/01/2020   TIBC 156 (L) 04/01/2020   UIBC 47 (L) 04/01/2020   IRONPCTSAT 70 (H) 04/01/2020   Lab Results  Component Value Date   RBC 3.08 (L) 04/08/2020   No results found for: KPAFRELGTCHN, LAMBDASER, KAPLAMBRATIO No results found for: IGGSERUM, IGA, IGMSERUM No results found for: Ronnald Ramp, A1GS, Nelida Meuse, SPEI   Chemistry      Component Value Date/Time   NA 141 04/01/2020 0915   K 2.9 (LL) 04/01/2020 0915   CL 101 04/01/2020 0915   CO2 30 04/01/2020 0915   BUN 5 (L) 04/01/2020 0915   CREATININE 0.51 04/01/2020 0915   CREATININE 0.59 01/03/2020 1216      Component Value Date/Time   CALCIUM 7.2 (L) 04/01/2020 0915   ALKPHOS 82 04/01/2020 0915   AST 21 04/01/2020 0915   ALT 13 04/01/2020 0915   BILITOT 0.6 04/01/2020 0915       Impression and Plan: AnitaWilliamsis a verypleasant59yoAfrican American with metastatic colon cancer primarily ofthe cecum withextensive liver metastasis. CEA is now down to 77!!! She is doing well and has no complaints at this time.  I spoke with Dr. Marin Olp and we will pick back up with treatment today.  We will plan to see her again in another 2 weeks.  She can contact our office with any questions or concerns.   Laverna Peace, NP 9/7/20219:39 AM

## 2020-04-08 NOTE — Addendum Note (Signed)
Addended by: Burney Gauze R on: 04/08/2020 10:34 AM   Modules accepted: Orders

## 2020-04-09 ENCOUNTER — Ambulatory Visit: Payer: Self-pay

## 2020-04-09 ENCOUNTER — Ambulatory Visit (INDEPENDENT_AMBULATORY_CARE_PROVIDER_SITE_OTHER): Payer: Self-pay | Admitting: Infectious Disease

## 2020-04-09 ENCOUNTER — Encounter: Payer: Self-pay | Admitting: Infectious Disease

## 2020-04-09 VITALS — BP 136/92 | HR 89 | Temp 98.5°F | Wt 129.0 lb

## 2020-04-09 DIAGNOSIS — C787 Secondary malignant neoplasm of liver and intrahepatic bile duct: Secondary | ICD-10-CM

## 2020-04-09 DIAGNOSIS — Z23 Encounter for immunization: Secondary | ICD-10-CM

## 2020-04-09 DIAGNOSIS — B20 Human immunodeficiency virus [HIV] disease: Secondary | ICD-10-CM

## 2020-04-09 DIAGNOSIS — I2699 Other pulmonary embolism without acute cor pulmonale: Secondary | ICD-10-CM

## 2020-04-09 NOTE — Progress Notes (Signed)
   Covid-19 Vaccination Clinic  Name:  Anita Hoffman    MRN: [id number] DOB: Oct 05, 1960  04/09/2020  [name] was observed post Covid-19 immunization for 15 minutes without incident. She was provided with Vaccine Information Sheet and instruction to access the V-Safe system.   [name] was instructed to call 911 with any severe reactions post vaccine: Marland Kitchen Difficulty breathing  . Swelling of face and throat  . A fast heartbeat  . A bad rash all over body  . Dizziness and weakness   Carlean Purl, RN

## 2020-04-09 NOTE — Progress Notes (Signed)
Subjective:  Chief complaint: Follow-up for HIV disease she had a recent fall with bruise under her lip  Patient ID: Anita Hoffman, female    DOB: 03/28/1961, 59 y.o.   MRN: [id number]  HPI  Anita Hoffman is a 59year old [place] American woman recently diagnosed with HIV disease (with healthy CD4 count and not that high of a viral load) but also with metastatic colon cancer and pulmonary emboli.  She is seeing Dr. Marin Olp with Oncology and receiving chemotherapy with FOLFOX.  We did same day initiation with Biktarvy in the hospital which she has tolerated without any problems.   Her malignancy is responding to chemotherapy and she is following closely with Dr. Marin Olp.  She is on Lovenox for anticoagulation  She has renewed her HMA P program she likely has Medicaid that is coming to place soon to.  Her viral load is undetectable and her CD4 count was healthy and we checked it last.  She has been living with her mother's been giving her Lovenox injections.   Past Medical History:  Diagnosis Date  . Former smoker   . Goals of care, counseling/discussion 12/06/2019  . HIV infection ([place])   . Iron deficiency anemia due to chronic blood loss 12/12/2019  . Medical history non-contributory     Past Surgical History:  Procedure Laterality Date  . BIOPSY  12/01/2019   Procedure: BIOPSY;  Surgeon: Lavena Bullion, DO;  Location: [facility];  Service: Gastroenterology;;  . COLONOSCOPY WITH PROPOFOL N/A 12/01/2019   Procedure: COLONOSCOPY WITH PROPOFOL;  Surgeon: Lavena Bullion, DO;  Location: [facility];  Service: Gastroenterology;  Laterality: N/A;  . HEMOSTASIS CLIP PLACEMENT  12/01/2019   Procedure: HEMOSTASIS CLIP PLACEMENT;  Surgeon: Lavena Bullion, DO;  Location: [facility];  Service: Gastroenterology;;  . IR IMAGING GUIDED PORT INSERTION  12/06/2019  . POLYPECTOMY  12/01/2019   Procedure: POLYPECTOMY;  Surgeon: Lavena Bullion, DO;  Location: [facility];  Service:  Gastroenterology;;  . SUBMUCOSAL TATTOO INJECTION  12/01/2019   Procedure: SUBMUCOSAL TATTOO INJECTION;  Surgeon: Lavena Bullion, DO;  Location: [facility];  Service: Gastroenterology;;  . TUBAL LIGATION      Family History  Problem Relation Age of Onset  . Ovarian cancer Maternal Grandmother   . Lung cancer Father       Social History   Socioeconomic History  . Marital status: Single    Spouse name: Not on file  . Number of children: 5  . Years of education: Not on file  . Highest education level: Not on file  Occupational History  . Not on file  Tobacco Use  . Smoking status: Former Smoker    Packs/day: 0.50    Types: Cigarettes    Quit date: 11/29/2003    Years since quitting: 16.3  . Smokeless tobacco: Never Used  Vaping Use  . Vaping Use: Never used  Substance and Sexual Activity  . Alcohol use: Not Currently    Comment: 1990  . Drug use: Yes    Types: Marijuana    Comment: 1990  . Sexual activity: Not Currently  Other Topics Concern  . Not on file  Social History Narrative  . Not on file   Social Determinants of Health   Financial Resource Strain:   . Difficulty of Paying Living Expenses: Not on file  Food Insecurity:   . Worried About Charity fundraiser in the Last Year: Not on file  . Ran Out of Food in the  Last Year: Not on file  Transportation Needs:   . Lack of Transportation (Medical): Not on file  . Lack of Transportation (Non-Medical): Not on file  Physical Activity:   . Days of Exercise per Week: Not on file  . Minutes of Exercise per Session: Not on file  Stress:   . Feeling of Stress : Not on file  Social Connections:   . Frequency of Communication with Friends and Family: Not on file  . Frequency of Social Gatherings with Friends and Family: Not on file  . Attends Religious Services: Not on file  . Active Member of Clubs or Organizations: Not on file  . Attends Archivist Meetings: Not on file  . Marital Status: Not on  file    No Known Allergies   Current Outpatient Medications:  .  apixaban (ELIQUIS) 5 MG TABS tablet, Take 1 tablet (5 mg total) by mouth 2 (two) times daily., Disp: 60 tablet, Rfl: 11 .  bictegravir-emtricitabine-tenofovir AF (BIKTARVY) 50-200-25 MG TABS tablet, Take 1 tablet by mouth daily., Disp: 30 tablet, Rfl: 6 .  dexamethasone (DECADRON) 4 MG tablet, Take 2 tablets (8 mg total) by mouth daily. Start the day after chemotherapy for 3 days. Take with food., Disp: 8 tablet, Rfl: 5 .  enoxaparin (LOVENOX) 80 MG/0.8ML injection, Inject 0.8 mLs (80 mg total) into the skin 2 (two) times daily., Disp: 48 mL, Rfl: 0 .  folic acid (FOLVITE) 1 MG tablet, Take 2 tablets (2 mg total) by mouth daily. (Patient not taking: Reported on 04/01/2020), Disp: 30 tablet, Rfl: 1 .  lidocaine-prilocaine (EMLA) cream, Apply to affected area once (Patient not taking: Reported on 04/01/2020), Disp: 30 g, Rfl: 3 .  loperamide (IMODIUM A-D) 2 MG tablet, Take 2 at onset of diarrhea, then 1 every 2hrs until 12hr without a BM. May take 2 tab every 4hrs at bedtime. If diarrhea recurs repeat. (Patient not taking: Reported on 03/18/2020), Disp: 100 tablet, Rfl: 1 .  LORazepam (ATIVAN) 0.5 MG tablet, Take 1 tablet (0.5 mg total) by mouth every 6 (six) hours as needed for anxiety., Disp: 30 tablet, Rfl: 0 .  ondansetron (ZOFRAN) 8 MG tablet, Take 1 tablet (8 mg total) by mouth 2 (two) times daily as needed. Start on day 3 after chemotherapy., Disp: 30 tablet, Rfl: 1 .  pantoprazole (PROTONIX) 40 MG tablet, Take 1 tablet (40 mg total) by mouth 2 (two) times daily., Disp: 60 tablet, Rfl: 1 .  potassium chloride SA (KLOR-CON) 20 MEQ tablet, Take 2 tablets (40 mEq total) by mouth 2 (two) times daily., Disp: 120 tablet, Rfl: 1 .  predniSONE (DELTASONE) 10 MG tablet, Take 1 tablet (10 mg total) by mouth daily with breakfast., Disp: 30 tablet, Rfl: 3 .  prochlorperazine (COMPAZINE) 10 MG tablet, Take 1 tablet (10 mg total) by mouth every  6 (six) hours as needed (Nausea or vomiting)., Disp: 30 tablet, Rfl: 1 .  traMADol (ULTRAM) 50 MG tablet, Take 1 tablet (50 mg total) by mouth every 12 (twelve) hours as needed., Disp: 30 tablet, Rfl: 0  Review of Systems  Constitutional: Negative for activity change, appetite change, chills, diaphoresis, fatigue, fever and unexpected weight change.  HENT: Negative for congestion, rhinorrhea, sinus pressure, sneezing, sore throat and trouble swallowing.   Eyes: Negative for photophobia and visual disturbance.  Respiratory: Negative for cough, chest tightness, shortness of breath, wheezing and stridor.   Cardiovascular: Negative for chest pain, palpitations and leg swelling.  Gastrointestinal: Negative for abdominal distention,  abdominal pain, anal bleeding, blood in stool, constipation, diarrhea, nausea and vomiting.  Genitourinary: Negative for difficulty urinating, dysuria, flank pain and hematuria.  Musculoskeletal: Negative for arthralgias, back pain, gait problem, joint swelling and myalgias.  Skin: Negative for color change, pallor, rash and wound.  Neurological: Negative for dizziness, tremors, weakness and light-headedness.  Hematological: Negative for adenopathy. Does not bruise/bleed easily.  Psychiatric/Behavioral: Negative for agitation, behavioral problems, confusion, decreased concentration, dysphoric mood, self-injury and sleep disturbance.       Objective:   Physical Exam Constitutional:      General: She is not in acute distress.    Appearance: She is not diaphoretic.  HENT:     Head: Normocephalic and atraumatic.     Right Ear: External ear normal.     Left Ear: External ear normal.     Nose: Nose normal.     Mouth/Throat:     Pharynx: No oropharyngeal exudate.  Eyes:     General: No scleral icterus.    Conjunctiva/sclera: Conjunctivae normal.     Pupils: Pupils are equal, round, and reactive to light.  Cardiovascular:     Rate and Rhythm: Normal rate and regular  rhythm.  Pulmonary:     Effort: Pulmonary effort is normal. No respiratory distress.     Breath sounds: No wheezing.  Abdominal:     General: Bowel sounds are normal.     Palpations: Abdomen is soft.     Tenderness: There is no abdominal tenderness. There is no rebound.  Musculoskeletal:        General: No tenderness. Normal range of motion.     Cervical back: Normal range of motion and neck supple.  Lymphadenopathy:     Cervical: No cervical adenopathy.  Skin:    General: Skin is warm and dry.     Coloration: Skin is not pale.     Findings: No erythema or rash.  Neurological:     General: No focal deficit present.     Mental Status: She is alert and oriented to person, place, and time.     Coordination: Coordination normal.  Psychiatric:        Mood and Affect: Mood normal.        Behavior: Behavior normal.        Thought Content: Thought content normal.        Judgment: Judgment normal.     Port is clean dry and intact      Assessment & Plan:  HIV disease well-controlled, continue Biktarvy HMA P is renewed she has Medicaid that is likely going to come and play which will remove our need to have her continue to renew the HMA P   PEs: on anticoagulation   Metastatic colon cancer: on chemotherapy and responding  For vaccination she needs several vaccines but was like them to get multiple today so we decided to start with Aniwa #1 today she does need flu and Pneumovax as well and likely Prevnar

## 2020-04-10 ENCOUNTER — Inpatient Hospital Stay: Payer: Medicaid Other

## 2020-04-10 ENCOUNTER — Other Ambulatory Visit: Payer: Self-pay

## 2020-04-10 VITALS — BP 99/67 | HR 98 | Temp 97.9°F | Resp 17

## 2020-04-10 DIAGNOSIS — D490 Neoplasm of unspecified behavior of digestive system: Secondary | ICD-10-CM

## 2020-04-10 DIAGNOSIS — C787 Secondary malignant neoplasm of liver and intrahepatic bile duct: Secondary | ICD-10-CM

## 2020-04-10 DIAGNOSIS — Z5111 Encounter for antineoplastic chemotherapy: Secondary | ICD-10-CM | POA: Diagnosis not present

## 2020-04-10 DIAGNOSIS — D122 Benign neoplasm of ascending colon: Secondary | ICD-10-CM

## 2020-04-10 LAB — URINE CYTOLOGY ANCILLARY ONLY
Chlamydia: NEGATIVE
Comment: NEGATIVE
Comment: NORMAL
Neisseria Gonorrhea: NEGATIVE

## 2020-04-10 LAB — T-HELPER CELL (CD4) - (RCID CLINIC ONLY)
CD4 % Helper T Cell: 34 % (ref 33–65)
CD4 T Cell Abs: 418 /uL (ref 400–1790)

## 2020-04-10 MED ORDER — HEPARIN SOD (PORK) LOCK FLUSH 100 UNIT/ML IV SOLN
500.0000 [IU] | Freq: Once | INTRAVENOUS | Status: AC | PRN
  Administered 2020-04-10: 500 [IU]
  Filled 2020-04-10: qty 5

## 2020-04-10 MED ORDER — SODIUM CHLORIDE 0.9% FLUSH
10.0000 mL | INTRAVENOUS | Status: DC | PRN
  Administered 2020-04-10: 10 mL
  Filled 2020-04-10: qty 10

## 2020-04-10 MED ORDER — PEGFILGRASTIM-CBQV 6 MG/0.6ML ~~LOC~~ SOSY
6.0000 mg | PREFILLED_SYRINGE | Freq: Once | SUBCUTANEOUS | Status: AC
  Administered 2020-04-10: 6 mg via SUBCUTANEOUS

## 2020-04-11 LAB — COMPLETE METABOLIC PANEL WITH GFR
AG Ratio: 1.2 (calc) (ref 1.0–2.5)
ALT: 14 U/L (ref 6–29)
AST: 17 U/L (ref 10–35)
Albumin: 3.4 g/dL — ABNORMAL LOW (ref 3.6–5.1)
Alkaline phosphatase (APISO): 89 U/L (ref 37–153)
BUN: 7 mg/dL (ref 7–25)
CO2: 26 mmol/L (ref 20–32)
Calcium: 8.8 mg/dL (ref 8.6–10.4)
Chloride: 103 mmol/L (ref 98–110)
Creat: 0.65 mg/dL (ref 0.50–1.05)
GFR, Est African American: 113 mL/min/{1.73_m2} (ref >=60)
GFR, Est Non African American: 98 mL/min/{1.73_m2} (ref >=60)
Globulin: 2.9 g/dL (calc) (ref 1.9–3.7)
Glucose, Bld: 118 mg/dL — ABNORMAL HIGH (ref 65–99)
Potassium: 5.1 mmol/L (ref 3.5–5.3)
Sodium: 138 mmol/L (ref 135–146)
Total Bilirubin: 0.7 mg/dL (ref 0.2–1.2)
Total Protein: 6.3 g/dL (ref 6.1–8.1)

## 2020-04-11 LAB — CBC WITH DIFFERENTIAL/PLATELET
Absolute Monocytes: 454 cells/uL (ref 200–950)
Basophils Absolute: 9 cells/uL (ref 0–200)
Basophils Relative: 0.1 %
Eosinophils Absolute: 0 cells/uL — ABNORMAL LOW (ref 15–500)
Eosinophils Relative: 0 %
HCT: 33.6 % — ABNORMAL LOW (ref 35.0–45.0)
Hemoglobin: 11.3 g/dL — ABNORMAL LOW (ref 11.7–15.5)
Lymphs Abs: 1344 cells/uL (ref 850–3900)
MCH: 33.6 pg — ABNORMAL HIGH (ref 27.0–33.0)
MCHC: 33.6 g/dL (ref 32.0–36.0)
MCV: 100 fL (ref 80.0–100.0)
MPV: 9.3 fL (ref 7.5–12.5)
Monocytes Relative: 5.1 %
Neutro Abs: 7093 cells/uL (ref 1500–7800)
Neutrophils Relative %: 79.7 %
Platelets: 366 10*3/uL (ref 140–400)
RBC: 3.36 10*6/uL — ABNORMAL LOW (ref 3.80–5.10)
RDW: 13.5 % (ref 11.0–15.0)
Total Lymphocyte: 15.1 %
WBC: 8.9 10*3/uL (ref 3.8–10.8)

## 2020-04-11 LAB — RPR: RPR Ser Ql: NONREACTIVE

## 2020-04-11 LAB — HIV-1 RNA QUANT-NO REFLEX-BLD
HIV 1 RNA Quant: <20 Copies/mL
HIV-1 RNA Quant, Log: <1.3 Log cps/mL

## 2020-04-16 ENCOUNTER — Other Ambulatory Visit: Payer: Self-pay | Admitting: Hematology & Oncology

## 2020-04-21 ENCOUNTER — Other Ambulatory Visit: Payer: Self-pay | Admitting: *Deleted

## 2020-04-21 ENCOUNTER — Inpatient Hospital Stay: Payer: Medicaid Other

## 2020-04-21 ENCOUNTER — Inpatient Hospital Stay (HOSPITAL_BASED_OUTPATIENT_CLINIC_OR_DEPARTMENT_OTHER): Payer: Medicaid Other | Admitting: Hematology & Oncology

## 2020-04-21 ENCOUNTER — Telehealth: Payer: Self-pay | Admitting: *Deleted

## 2020-04-21 ENCOUNTER — Encounter: Payer: Self-pay | Admitting: Hematology & Oncology

## 2020-04-21 ENCOUNTER — Other Ambulatory Visit: Payer: Self-pay

## 2020-04-21 VITALS — BP 120/83 | HR 111 | Temp 99.0°F | Resp 20 | Ht 62.0 in | Wt 128.8 lb

## 2020-04-21 DIAGNOSIS — D122 Benign neoplasm of ascending colon: Secondary | ICD-10-CM

## 2020-04-21 DIAGNOSIS — Z95828 Presence of other vascular implants and grafts: Secondary | ICD-10-CM

## 2020-04-21 DIAGNOSIS — C180 Malignant neoplasm of cecum: Secondary | ICD-10-CM

## 2020-04-21 DIAGNOSIS — C787 Secondary malignant neoplasm of liver and intrahepatic bile duct: Secondary | ICD-10-CM

## 2020-04-21 DIAGNOSIS — Z5111 Encounter for antineoplastic chemotherapy: Secondary | ICD-10-CM | POA: Diagnosis not present

## 2020-04-21 DIAGNOSIS — D490 Neoplasm of unspecified behavior of digestive system: Secondary | ICD-10-CM

## 2020-04-21 DIAGNOSIS — D500 Iron deficiency anemia secondary to blood loss (chronic): Secondary | ICD-10-CM

## 2020-04-21 LAB — CMP (CANCER CENTER ONLY)
ALT: 18 U/L (ref 0–44)
AST: 24 U/L (ref 15–41)
Albumin: 3.5 g/dL (ref 3.5–5.0)
Alkaline Phosphatase: 90 U/L (ref 38–126)
Anion gap: 12 (ref 5–15)
BUN: 6 mg/dL (ref 6–20)
CO2: 27 mmol/L (ref 22–32)
Calcium: 8.1 mg/dL — ABNORMAL LOW (ref 8.9–10.3)
Chloride: 103 mmol/L (ref 98–111)
Creatinine: 0.66 mg/dL (ref 0.44–1.00)
GFR, Est AFR Am: >60 mL/min (ref >60)
GFR, Estimated: >60 mL/min (ref >60)
Glucose, Bld: 123 mg/dL — ABNORMAL HIGH (ref 70–99)
Potassium: 3.1 mmol/L — ABNORMAL LOW (ref 3.5–5.1)
Sodium: 142 mmol/L (ref 135–145)
Total Bilirubin: 0.3 mg/dL (ref 0.3–1.2)
Total Protein: 6.2 g/dL — ABNORMAL LOW (ref 6.5–8.1)

## 2020-04-21 LAB — SAMPLE TO BLOOD BANK

## 2020-04-21 LAB — CBC WITH DIFFERENTIAL (CANCER CENTER ONLY)
Abs Immature Granulocytes: 0.14 10*3/uL — ABNORMAL HIGH (ref 0.00–0.07)
Basophils Absolute: 0 10*3/uL (ref 0.0–0.1)
Basophils Relative: 0 %
Eosinophils Absolute: 0 10*3/uL (ref 0.0–0.5)
Eosinophils Relative: 0 %
HCT: 30.7 % — ABNORMAL LOW (ref 36.0–46.0)
Hemoglobin: 10 g/dL — ABNORMAL LOW (ref 12.0–15.0)
Immature Granulocytes: 2 %
Lymphocytes Relative: 41 %
Lymphs Abs: 3.1 10*3/uL (ref 0.7–4.0)
MCH: 34.2 pg — ABNORMAL HIGH (ref 26.0–34.0)
MCHC: 32.6 g/dL (ref 30.0–36.0)
MCV: 105.1 fL — ABNORMAL HIGH (ref 80.0–100.0)
Monocytes Absolute: 0.7 10*3/uL (ref 0.1–1.0)
Monocytes Relative: 9 %
Neutro Abs: 3.7 10*3/uL (ref 1.7–7.7)
Neutrophils Relative %: 48 %
Platelet Count: 265 10*3/uL (ref 150–400)
RBC: 2.92 MIL/uL — ABNORMAL LOW (ref 3.87–5.11)
RDW: 14.1 % (ref 11.5–15.5)
WBC Count: 7.6 10*3/uL (ref 4.0–10.5)
nRBC: 0 % (ref 0.0–0.2)

## 2020-04-21 MED ORDER — HEPARIN SOD (PORK) LOCK FLUSH 100 UNIT/ML IV SOLN
500.0000 [IU] | Freq: Once | INTRAVENOUS | Status: AC
  Administered 2020-04-21: 500 [IU] via INTRAVENOUS
  Filled 2020-04-21: qty 5

## 2020-04-21 MED ORDER — HYDROCORTISONE (PERIANAL) 2.5 % EX CREA: 1.0000 "application " | TOPICAL_CREAM | Freq: Two times a day (BID) | CUTANEOUS | 0 refills | Status: DC

## 2020-04-21 MED ORDER — SODIUM CHLORIDE 0.9% FLUSH
10.0000 mL | INTRAVENOUS | Status: DC | PRN
  Administered 2020-04-21: 10 mL via INTRAVENOUS
  Filled 2020-04-21: qty 10

## 2020-04-21 MED ORDER — RIVAROXABAN 20 MG PO TABS: 20.0000 mg | ORAL_TABLET | Freq: Every day | ORAL | 4 refills | Status: DC

## 2020-04-21 NOTE — Patient Instructions (Signed)
Implanted Port Insertion, Care After °This sheet gives you information about how to care for yourself after your procedure. Your health care provider may also give you more specific instructions. If you have problems or questions, contact your health care provider. °What can I expect after the procedure? °After the procedure, it is common to have: °· Discomfort at the port insertion site. °· Bruising on the skin over the port. This should improve over 3-4 days. °Follow these instructions at home: °Port care °· After your port is placed, you will get a manufacturer's information card. The card has information about your port. Keep this card with you at all times. °· Take care of the port as told by your health care provider. Ask your health care provider if you or a family member can get training for taking care of the port at home. A home health care nurse may also take care of the port. °· Make sure to remember what type of port you have. °Incision care ° °  ° °· Follow instructions from your health care provider about how to take care of your port insertion site. Make sure you: °? Wash your hands with soap and water before and after you change your bandage (dressing). If soap and water are not available, use hand sanitizer. °? Change your dressing as told by your health care provider. °? Leave stitches (sutures), skin glue, or adhesive strips in place. These skin closures may need to stay in place for 2 weeks or longer. If adhesive strip edges start to loosen and curl up, you may trim the loose edges. Do not remove adhesive strips completely unless your health care provider tells you to do that. °· Check your port insertion site every day for signs of infection. Check for: °? Redness, swelling, or pain. °? Fluid or blood. °? Warmth. °? Pus or a bad smell. °Activity °· Return to your normal activities as told by your health care provider. Ask your health care provider what activities are safe for you. °· Do not  lift anything that is heavier than 10 lb (4.5 kg), or the limit that you are told, until your health care provider says that it is safe. °General instructions °· Take over-the-counter and prescription medicines only as told by your health care provider. °· Do not take baths, swim, or use a hot tub until your health care provider approves. Ask your health care provider if you may take showers. You may only be allowed to take sponge baths. °· Do not drive for 24 hours if you were given a sedative during your procedure. °· Wear a medical alert bracelet in case of an emergency. This will tell any health care providers that you have a port. °· Keep all follow-up visits as told by your health care provider. This is important. °Contact a health care provider if: °· You cannot flush your port with saline as directed, or you cannot draw blood from the port. °· You have a fever or chills. °· You have redness, swelling, or pain around your port insertion site. °· You have fluid or blood coming from your port insertion site. °· Your port insertion site feels warm to the touch. °· You have pus or a bad smell coming from the port insertion site. °Get help right away if: °· You have chest pain or shortness of breath. °· You have bleeding from your port that you cannot control. °Summary °· Take care of the port as told by your health   care provider. Keep the manufacturer's information card with you at all times. °· Change your dressing as told by your health care provider. °· Contact a health care provider if you have a fever or chills or if you have redness, swelling, or pain around your port insertion site. °· Keep all follow-up visits as told by your health care provider. °This information is not intended to replace advice given to you by your health care provider. Make sure you discuss any questions you have with your health care provider. °Document Revised: 02/14/2018 Document Reviewed: 02/14/2018 °Elsevier Patient Education ©  2020 [employer] ° °

## 2020-04-21 NOTE — Progress Notes (Signed)
Hematology and Oncology Follow Up Visit  Anita Hoffman [id number] Dec 14, 1960 59 y.o. 04/21/2020   Principle Diagnosis:  Metastatic adenocarcinoma of the cecum-liver metastasis Pulmonary emboli and LEFT gastrocnemius vein thrombus HIV-asymptomatic Iron deficiency secondary to GI bleeding  Current Therapy: FOLFOXIRI - startedon 12/12/2019, s/p cycle#6 Biktarvy 1 p.o. daily Udenycasq post chemo Lovenox 80 mg subcu twice daily IV ironas indicated   Interim History:  Anita Hoffman is here today for an early visit.  She called earlier this morning says she had some bright red blood per rectum.  She really has not had an episode like this for quite a while.  I know that she is on Lovenox.  She is not having abdominal pain.  She may have had a little bit of constipation.  She does have a hemorrhoid.  She has had no nausea or vomiting.  Her last CEA level was down to 77.  She has responded incredibly well to treatment.  She has had no fever.  There is no cough or shortness of breath.  Again, she is on Lovenox.  Apparently, she also had been on Eliquis.  I am still not sure exactly what she is taking.  I want to see if we cannot get her onto something that is oral.    Overall, her performance status is ECOG 1.  Medications:  Allergies as of 04/21/2020   No Known Allergies     Medication List       Accurate as of April 21, 2020  1:41 PM. If you have any questions, ask your nurse or doctor.        apixaban 5 MG Tabs tablet Commonly known as: ELIQUIS Take 1 tablet (5 mg total) by mouth 2 (two) times daily.   Biktarvy 50-200-25 MG Tabs tablet Generic drug: bictegravir-emtricitabine-tenofovir AF Take 1 tablet by mouth daily.   dexamethasone 4 MG tablet Commonly known as: DECADRON Take 2 tablets (8 mg total) by mouth daily. Start the day after chemotherapy for 3 days. Take with food.   enoxaparin 80 MG/0.8ML injection Commonly known as: LOVENOX Inject  0.8 mLs (80 mg total) into the skin 2 (two) times daily.   folic acid 1 MG tablet Commonly known as: FOLVITE Take 2 tablets (2 mg total) by mouth daily.   lidocaine-prilocaine cream Commonly known as: EMLA Apply to affected area once   loperamide 2 MG tablet Commonly known as: Imodium A-D Take 2 at onset of diarrhea, then 1 every 2hrs until 12hr without a BM. May take 2 tab every 4hrs at bedtime. If diarrhea recurs repeat.   LORazepam 0.5 MG tablet Commonly known as: Ativan Take 1 tablet (0.5 mg total) by mouth every 6 (six) hours as needed for anxiety.   ondansetron 8 MG tablet Commonly known as: Zofran Take 1 tablet (8 mg total) by mouth 2 (two) times daily as needed. Start on day 3 after chemotherapy.   pantoprazole 40 MG tablet Commonly known as: PROTONIX Take 1 tablet (40 mg total) by mouth 2 (two) times daily.   potassium chloride SA 20 MEQ tablet Commonly known as: KLOR-CON Take 2 tablets (40 mEq total) by mouth 2 (two) times daily.   predniSONE 10 MG tablet Commonly known as: DELTASONE Take 1 tablet (10 mg total) by mouth daily with breakfast.   prochlorperazine 10 MG tablet Commonly known as: COMPAZINE Take 1 tablet (10 mg total) by mouth every 6 (six) hours as needed (Nausea or vomiting).   traMADol 50 MG tablet Commonly known  as: ULTRAM Take 1 tablet (50 mg total) by mouth every 12 (twelve) hours as needed.       Allergies: No Known Allergies  Past Medical History, Surgical history, Social history, and Family History were reviewed and updated.  Review of Systems: Review of Systems  Constitutional: Negative.   HENT: Negative.   Eyes: Negative.   Respiratory: Negative.   Cardiovascular: Negative.   Gastrointestinal: Positive for blood in stool.  Genitourinary: Negative.   Musculoskeletal: Negative.   Skin: Negative.   Neurological: Negative.   Endo/Heme/Allergies: Negative.   Psychiatric/Behavioral: Negative.      Physical Exam:  height is  5\' 2"  (1.575 m) and weight is 128 lb 12 oz (58.4 kg). Her temperature is 99 F (37.2 C). Her blood pressure is 120/83 and her pulse is 111 (abnormal). Her respiration is 20 and oxygen saturation is 100%.   Wt Readings from Last 3 Encounters:  04/21/20 128 lb 12 oz (58.4 kg)  04/09/20 129 lb (58.5 kg)  04/08/20 128 lb 1.9 oz (58.1 kg)    Physical Exam Vitals reviewed.  HENT:     Head: Normocephalic and atraumatic.  Eyes:     Pupils: Pupils are equal, round, and reactive to light.  Cardiovascular:     Rate and Rhythm: Normal rate and regular rhythm.     Heart sounds: Normal heart sounds.  Pulmonary:     Effort: Pulmonary effort is normal.     Breath sounds: Normal breath sounds.  Abdominal:     General: Bowel sounds are normal.     Palpations: Abdomen is soft.     Comments: Her abdomen is soft.  She has decent bowel sounds.  There is no guarding or rebound tenderness.  There is no fluid wave.  There is no abdominal mass.  Rectal exam shows a relatively large external hemorrhoid.  There is no obvious bleeding from this hemorrhoid.  There may be a little bit of old blood with this.  Musculoskeletal:        General: No tenderness or deformity. Normal range of motion.     Cervical back: Normal range of motion.  Lymphadenopathy:     Cervical: No cervical adenopathy.  Skin:    General: Skin is warm and dry.     Findings: No erythema or rash.  Neurological:     Mental Status: She is alert and oriented to person, place, and time.  Psychiatric:        Behavior: Behavior normal.        Thought Content: Thought content normal.        Judgment: Judgment normal.      Lab Results  Component Value Date   WBC 7.6 04/21/2020   HGB 10.0 (L) 04/21/2020   HCT 30.7 (L) 04/21/2020   MCV 105.1 (H) 04/21/2020   PLT 265 04/21/2020   Lab Results  Component Value Date   FERRITIN 2,268 (H) 04/01/2020   IRON 109 04/01/2020   TIBC 156 (L) 04/01/2020   UIBC 47 (L) 04/01/2020   IRONPCTSAT 70  (H) 04/01/2020   Lab Results  Component Value Date   RBC 2.92 (L) 04/21/2020   No results found for: KPAFRELGTCHN, LAMBDASER, KAPLAMBRATIO No results found for: IGGSERUM, IGA, IGMSERUM No results found for: Ronnald Ramp, A1GS, A2GS, Violet Baldy MSPIKE, SPEI   Chemistry      Component Value Date/Time   NA 142 04/21/2020 1217   K 3.1 (L) 04/21/2020 1217   CL 103 04/21/2020 1217  CO2 27 04/21/2020 1217   BUN 6 04/21/2020 1217   CREATININE 0.66 04/21/2020 1217   CREATININE 0.65 04/09/2020 1015      Component Value Date/Time   CALCIUM 8.1 (L) 04/21/2020 1217   ALKPHOS 90 04/21/2020 1217   AST 24 04/21/2020 1217   ALT 18 04/21/2020 1217   BILITOT 0.3 04/21/2020 1217       Impression and Plan: AnitaWilliamsis a verypleasant59yoAfrican American with metastatic colon cancer primarily ofthe cecum withextensive liver metastasis.   I guess it would not surprise me if she had a little bit of bleeding.  Again her hemoglobin is not that bad.  As such, I do not think that we need to transfuse her.  We will give her some Proctofoam to try to help with his hemorrhoid.  If this bleeding becomes more of a nuisance, then we will will have to see if we can get this hemorrhoid taken care of.  Her weight is holding stable.  She is responding to treatment.  As such, we will continue her on treatment.  We will call her to see exactly what she is taking for anticoagulation.  I just cannot believe she has taken both Eliquis and Lovenox.   We will plan to have her come back when she has her eighth cycle of treatment.   Volanda Napoleon, MD 9/20/20211:41 PM

## 2020-04-21 NOTE — Telephone Encounter (Signed)
Call placed to patient to check on pt.'s status per order of Dr. Marin Olp d/t call from answering service this morning that pt states rectal bleeding has started this morning.  Pt states that bleeding did start this morning and that blood is a small amount of bright red blood in toilet water and also with wiping.  Dr. Marin Olp notified and would like for pt to come in today for labs and to be seen. Message sent to scheduling.

## 2020-04-22 ENCOUNTER — Telehealth: Payer: Self-pay | Admitting: *Deleted

## 2020-04-22 ENCOUNTER — Telehealth: Payer: Self-pay | Admitting: Hematology & Oncology

## 2020-04-22 LAB — FERRITIN: Ferritin: 2183 ng/mL — ABNORMAL HIGH (ref 11–307)

## 2020-04-22 LAB — IRON AND TIBC
Iron: 180 ug/dL — ABNORMAL HIGH (ref 41–142)
Saturation Ratios: 103 % — ABNORMAL HIGH (ref 21–57)
TIBC: 175 ug/dL — ABNORMAL LOW (ref 236–444)
UIBC: UNDETERMINED ug/dL (ref 120–384)

## 2020-04-22 NOTE — Telephone Encounter (Signed)
Appointments scheduled calendar printed & mailed per 9/20 los 

## 2020-04-22 NOTE — Telephone Encounter (Signed)
This nurse called and left a message for patient instructing her that she should only be taking Eliquis. She should not be taking Lovenox injections with Eliquis. Instructed her to call the office to make sure she got this message.

## 2020-04-23 ENCOUNTER — Inpatient Hospital Stay: Payer: Medicaid Other

## 2020-04-23 ENCOUNTER — Other Ambulatory Visit: Payer: Self-pay

## 2020-04-23 ENCOUNTER — Ambulatory Visit: Payer: Self-pay

## 2020-04-23 ENCOUNTER — Inpatient Hospital Stay: Payer: Medicaid Other | Admitting: Hematology & Oncology

## 2020-04-23 DIAGNOSIS — C787 Secondary malignant neoplasm of liver and intrahepatic bile duct: Secondary | ICD-10-CM

## 2020-04-23 DIAGNOSIS — D490 Neoplasm of unspecified behavior of digestive system: Secondary | ICD-10-CM

## 2020-04-23 DIAGNOSIS — Z5111 Encounter for antineoplastic chemotherapy: Secondary | ICD-10-CM | POA: Diagnosis not present

## 2020-04-23 DIAGNOSIS — D122 Benign neoplasm of ascending colon: Secondary | ICD-10-CM

## 2020-04-23 LAB — CMP (CANCER CENTER ONLY)
ALT: 19 U/L (ref 0–44)
AST: 25 U/L (ref 15–41)
Albumin: 3.3 g/dL — ABNORMAL LOW (ref 3.5–5.0)
Alkaline Phosphatase: 80 U/L (ref 38–126)
Anion gap: 8 (ref 5–15)
BUN: 6 mg/dL (ref 6–20)
CO2: 28 mmol/L (ref 22–32)
Calcium: 8.4 mg/dL — ABNORMAL LOW (ref 8.9–10.3)
Chloride: 103 mmol/L (ref 98–111)
Creatinine: 0.56 mg/dL (ref 0.44–1.00)
GFR, Est AFR Am: >60 mL/min (ref >60)
GFR, Estimated: >60 mL/min (ref >60)
Glucose, Bld: 82 mg/dL (ref 70–99)
Potassium: 3.6 mmol/L (ref 3.5–5.1)
Sodium: 139 mmol/L (ref 135–145)
Total Bilirubin: 0.3 mg/dL (ref 0.3–1.2)
Total Protein: 5.8 g/dL — ABNORMAL LOW (ref 6.5–8.1)

## 2020-04-23 LAB — CBC WITH DIFFERENTIAL (CANCER CENTER ONLY)
Abs Immature Granulocytes: 0.15 10*3/uL — ABNORMAL HIGH (ref 0.00–0.07)
Basophils Absolute: 0 10*3/uL (ref 0.0–0.1)
Basophils Relative: 0 %
Eosinophils Absolute: 0 10*3/uL (ref 0.0–0.5)
Eosinophils Relative: 0 %
HCT: 29.7 % — ABNORMAL LOW (ref 36.0–46.0)
Hemoglobin: 9.5 g/dL — ABNORMAL LOW (ref 12.0–15.0)
Immature Granulocytes: 2 %
Lymphocytes Relative: 37 %
Lymphs Abs: 3.1 10*3/uL (ref 0.7–4.0)
MCH: 33.9 pg (ref 26.0–34.0)
MCHC: 32 g/dL (ref 30.0–36.0)
MCV: 106.1 fL — ABNORMAL HIGH (ref 80.0–100.0)
Monocytes Absolute: 0.7 10*3/uL (ref 0.1–1.0)
Monocytes Relative: 8 %
Neutro Abs: 4.5 10*3/uL (ref 1.7–7.7)
Neutrophils Relative %: 53 %
Platelet Count: 315 10*3/uL (ref 150–400)
RBC: 2.8 MIL/uL — ABNORMAL LOW (ref 3.87–5.11)
RDW: 14.3 % (ref 11.5–15.5)
WBC Count: 8.4 10*3/uL (ref 4.0–10.5)
nRBC: 0 % (ref 0.0–0.2)

## 2020-04-23 MED ORDER — PALONOSETRON HCL INJECTION 0.25 MG/5ML
INTRAVENOUS | Status: AC
  Filled 2020-04-23: qty 5

## 2020-04-23 MED ORDER — OXALIPLATIN CHEMO INJECTION 100 MG/20ML
115.0000 mg | Freq: Once | INTRAVENOUS | Status: AC
  Administered 2020-04-23: 115 mg via INTRAVENOUS
  Filled 2020-04-23: qty 10

## 2020-04-23 MED ORDER — DEXTROSE 5 % IV SOLN
Freq: Once | INTRAVENOUS | Status: AC
  Filled 2020-04-23: qty 250

## 2020-04-23 MED ORDER — SODIUM CHLORIDE 0.9 % IV SOLN
4600.0000 mg | INTRAVENOUS | Status: DC
  Administered 2020-04-23: 4600 mg via INTRAVENOUS
  Filled 2020-04-23: qty 92

## 2020-04-23 MED ORDER — SODIUM CHLORIDE 0.9 % IV SOLN
220.0000 mg | Freq: Once | INTRAVENOUS | Status: AC
  Administered 2020-04-23: 220 mg via INTRAVENOUS
  Filled 2020-04-23: qty 11

## 2020-04-23 MED ORDER — SODIUM CHLORIDE 0.9 % IV SOLN
150.0000 mg | Freq: Once | INTRAVENOUS | Status: AC
  Administered 2020-04-23: 150 mg via INTRAVENOUS
  Filled 2020-04-23: qty 150

## 2020-04-23 MED ORDER — LEUCOVORIN CALCIUM INJECTION 350 MG
320.0000 mg | Freq: Once | INTRAVENOUS | Status: AC
  Administered 2020-04-23: 320 mg via INTRAVENOUS
  Filled 2020-04-23: qty 16

## 2020-04-23 MED ORDER — SODIUM CHLORIDE 0.9 % IV SOLN
10.0000 mg | Freq: Once | INTRAVENOUS | Status: AC
  Administered 2020-04-23: 10 mg via INTRAVENOUS
  Filled 2020-04-23: qty 10

## 2020-04-23 MED ORDER — ATROPINE SULFATE 1 MG/ML IJ SOLN
INTRAMUSCULAR | Status: AC
  Filled 2020-04-23: qty 1

## 2020-04-23 MED ORDER — ATROPINE SULFATE 1 MG/ML IJ SOLN
0.5000 mg | Freq: Once | INTRAMUSCULAR | Status: AC | PRN
  Administered 2020-04-23: 0.5 mg via INTRAVENOUS

## 2020-04-23 MED ORDER — PALONOSETRON HCL INJECTION 0.25 MG/5ML
0.2500 mg | Freq: Once | INTRAVENOUS | Status: AC
  Administered 2020-04-23: 0.25 mg via INTRAVENOUS

## 2020-04-23 NOTE — Telephone Encounter (Signed)
Patient is here today for treatment. I asked her,"are you taking Eliquis and Lovenox injections together?" Patient stated, "I am taking the Lovenox injections. I am not on Eliquis." I called the patient's pharmacy regarding Eliquis. I spoke with the pharmacist and she verified that the Eliquis prescription was never picked up. It was prescribed on 02/21/2020. Per Dr. Marin Olp, keep her on Lovenox injections. I instructed the patient to continue taking the Lovenox injections. Do not take Eliquis. I will call the pharmacy and discontinue the prescription. She verbalized understanding.

## 2020-04-25 ENCOUNTER — Inpatient Hospital Stay: Payer: Medicaid Other

## 2020-04-25 ENCOUNTER — Other Ambulatory Visit: Payer: Self-pay

## 2020-04-25 VITALS — BP 110/55 | HR 101 | Temp 99.1°F | Resp 16

## 2020-04-25 DIAGNOSIS — C787 Secondary malignant neoplasm of liver and intrahepatic bile duct: Secondary | ICD-10-CM

## 2020-04-25 DIAGNOSIS — Z5111 Encounter for antineoplastic chemotherapy: Secondary | ICD-10-CM | POA: Diagnosis not present

## 2020-04-25 DIAGNOSIS — D490 Neoplasm of unspecified behavior of digestive system: Secondary | ICD-10-CM

## 2020-04-25 DIAGNOSIS — D702 Other drug-induced agranulocytosis: Secondary | ICD-10-CM

## 2020-04-25 DIAGNOSIS — D122 Benign neoplasm of ascending colon: Secondary | ICD-10-CM

## 2020-04-25 MED ORDER — SODIUM CHLORIDE 0.9% FLUSH
10.0000 mL | INTRAVENOUS | Status: DC | PRN
  Administered 2020-04-25: 10 mL
  Filled 2020-04-25: qty 10

## 2020-04-25 MED ORDER — PEGFILGRASTIM-CBQV 6 MG/0.6ML ~~LOC~~ SOSY
6.0000 mg | PREFILLED_SYRINGE | Freq: Once | SUBCUTANEOUS | Status: AC
  Administered 2020-04-25: 6 mg via SUBCUTANEOUS

## 2020-04-25 MED ORDER — HEPARIN SOD (PORK) LOCK FLUSH 100 UNIT/ML IV SOLN
500.0000 [IU] | Freq: Once | INTRAVENOUS | Status: AC | PRN
  Administered 2020-04-25: 500 [IU]
  Filled 2020-04-25: qty 5

## 2020-05-02 ENCOUNTER — Ambulatory Visit (INDEPENDENT_AMBULATORY_CARE_PROVIDER_SITE_OTHER): Payer: Self-pay

## 2020-05-02 ENCOUNTER — Other Ambulatory Visit: Payer: Self-pay

## 2020-05-02 DIAGNOSIS — Z23 Encounter for immunization: Secondary | ICD-10-CM

## 2020-05-02 NOTE — Progress Notes (Signed)
   Covid-19 Vaccination Clinic  Name:  Anita Hoffman    MRN: [id number] DOB: Oct 06, 1960  05/02/2020  [name] was observed post Covid-19 immunization for 15 minutes without incident. She was provided with Vaccine Information Sheet and instruction to access the V-Safe system.   [name] was instructed to call 911 with any severe reactions post vaccine: Marland Kitchen Difficulty breathing  . Swelling of face and throat  . A fast heartbeat  . A bad rash all over body  . Dizziness and weakness   Immunizations Administered    Name Date Dose VIS Date Route   Pfizer COVID-19 Vaccine 05/02/2020  9:54 AM 0.3 mL 09/26/2018 Intramuscular   Manufacturer: [place]   Lot: [id number]   [place]: [place], RN

## 2020-05-05 ENCOUNTER — Inpatient Hospital Stay: Payer: Medicaid Other | Attending: Hematology & Oncology

## 2020-05-05 ENCOUNTER — Other Ambulatory Visit (HOSPITAL_BASED_OUTPATIENT_CLINIC_OR_DEPARTMENT_OTHER): Payer: Self-pay | Admitting: Family

## 2020-05-05 ENCOUNTER — Inpatient Hospital Stay: Payer: Medicaid Other

## 2020-05-05 ENCOUNTER — Inpatient Hospital Stay (HOSPITAL_BASED_OUTPATIENT_CLINIC_OR_DEPARTMENT_OTHER): Payer: Medicaid Other | Admitting: Family

## 2020-05-05 ENCOUNTER — Encounter: Payer: Self-pay | Admitting: Family

## 2020-05-05 ENCOUNTER — Other Ambulatory Visit: Payer: Self-pay

## 2020-05-05 ENCOUNTER — Telehealth: Payer: Self-pay | Admitting: Family

## 2020-05-05 ENCOUNTER — Telehealth: Payer: Self-pay

## 2020-05-05 VITALS — BP 104/74 | HR 95 | Temp 97.9°F | Resp 18 | Ht 62.0 in | Wt 120.0 lb

## 2020-05-05 DIAGNOSIS — D500 Iron deficiency anemia secondary to blood loss (chronic): Secondary | ICD-10-CM

## 2020-05-05 DIAGNOSIS — C787 Secondary malignant neoplasm of liver and intrahepatic bile duct: Secondary | ICD-10-CM

## 2020-05-05 DIAGNOSIS — K922 Gastrointestinal hemorrhage, unspecified: Secondary | ICD-10-CM | POA: Insufficient documentation

## 2020-05-05 DIAGNOSIS — Z21 Asymptomatic human immunodeficiency virus [HIV] infection status: Secondary | ICD-10-CM | POA: Diagnosis not present

## 2020-05-05 DIAGNOSIS — C180 Malignant neoplasm of cecum: Secondary | ICD-10-CM | POA: Insufficient documentation

## 2020-05-05 DIAGNOSIS — R634 Abnormal weight loss: Secondary | ICD-10-CM | POA: Diagnosis not present

## 2020-05-05 DIAGNOSIS — I2699 Other pulmonary embolism without acute cor pulmonale: Secondary | ICD-10-CM | POA: Insufficient documentation

## 2020-05-05 DIAGNOSIS — Z79899 Other long term (current) drug therapy: Secondary | ICD-10-CM | POA: Diagnosis not present

## 2020-05-05 DIAGNOSIS — Z95828 Presence of other vascular implants and grafts: Secondary | ICD-10-CM

## 2020-05-05 DIAGNOSIS — D122 Benign neoplasm of ascending colon: Secondary | ICD-10-CM

## 2020-05-05 DIAGNOSIS — I82462 Acute embolism and thrombosis of left calf muscular vein: Secondary | ICD-10-CM | POA: Diagnosis not present

## 2020-05-05 LAB — CMP (CANCER CENTER ONLY)
ALT: 25 U/L (ref 0–44)
AST: 32 U/L (ref 15–41)
Albumin: 3.5 g/dL (ref 3.5–5.0)
Alkaline Phosphatase: 79 U/L (ref 38–126)
Anion gap: 11 (ref 5–15)
BUN: 8 mg/dL (ref 6–20)
CO2: 29 mmol/L (ref 22–32)
Calcium: 7.9 mg/dL — ABNORMAL LOW (ref 8.9–10.3)
Chloride: 102 mmol/L (ref 98–111)
Creatinine: 0.61 mg/dL (ref 0.44–1.00)
GFR, Est AFR Am: >60 mL/min (ref >60)
GFR, Estimated: >60 mL/min (ref >60)
Glucose, Bld: 105 mg/dL — ABNORMAL HIGH (ref 70–99)
Potassium: 2.7 mmol/L — CL (ref 3.5–5.1)
Sodium: 142 mmol/L (ref 135–145)
Total Bilirubin: 0.3 mg/dL (ref 0.3–1.2)
Total Protein: 6.1 g/dL — ABNORMAL LOW (ref 6.5–8.1)

## 2020-05-05 LAB — CBC WITH DIFFERENTIAL (CANCER CENTER ONLY)
Abs Immature Granulocytes: 0.28 10*3/uL — ABNORMAL HIGH (ref 0.00–0.07)
Basophils Absolute: 0 10*3/uL (ref 0.0–0.1)
Basophils Relative: 1 %
Eosinophils Absolute: 0 10*3/uL (ref 0.0–0.5)
Eosinophils Relative: 0 %
HCT: 33.5 % — ABNORMAL LOW (ref 36.0–46.0)
Hemoglobin: 11 g/dL — ABNORMAL LOW (ref 12.0–15.0)
Immature Granulocytes: 3 %
Lymphocytes Relative: 36 %
Lymphs Abs: 3 10*3/uL (ref 0.7–4.0)
MCH: 33.5 pg (ref 26.0–34.0)
MCHC: 32.8 g/dL (ref 30.0–36.0)
MCV: 102.1 fL — ABNORMAL HIGH (ref 80.0–100.0)
Monocytes Absolute: 1 10*3/uL (ref 0.1–1.0)
Monocytes Relative: 11 %
Neutro Abs: 4.2 10*3/uL (ref 1.7–7.7)
Neutrophils Relative %: 49 %
Platelet Count: 116 10*3/uL — ABNORMAL LOW (ref 150–400)
RBC: 3.28 MIL/uL — ABNORMAL LOW (ref 3.87–5.11)
RDW: 14 % (ref 11.5–15.5)
WBC Count: 8.5 10*3/uL (ref 4.0–10.5)
nRBC: 0 % (ref 0.0–0.2)

## 2020-05-05 LAB — LACTATE DEHYDROGENASE: LDH: 311 U/L — ABNORMAL HIGH (ref 98–192)

## 2020-05-05 MED ORDER — SODIUM CHLORIDE 0.9% FLUSH
10.0000 mL | Freq: Once | INTRAVENOUS | Status: AC
  Administered 2020-05-05: 10 mL via INTRAVENOUS
  Filled 2020-05-05: qty 10

## 2020-05-05 MED ORDER — APIXABAN 5 MG PO TABS: 5.0000 mg | ORAL_TABLET | Freq: Two times a day (BID) | ORAL | 5 refills | Status: DC

## 2020-05-05 MED ORDER — APIXABAN 5 MG PO TABS: 5.0000 mg | ORAL_TABLET | Freq: Two times a day (BID) | ORAL | 5 refills | Status: AC

## 2020-05-05 MED ORDER — HEPARIN SOD (PORK) LOCK FLUSH 100 UNIT/ML IV SOLN
500.0000 [IU] | Freq: Once | INTRAVENOUS | Status: AC
  Administered 2020-05-05: 500 [IU] via INTRAVENOUS
  Filled 2020-05-05: qty 5

## 2020-05-05 MED FILL — ELIQUIS 5 MG TABLET: 5 | 30 days supply | Qty: 60 | Fill #0

## 2020-05-05 NOTE — Telephone Encounter (Signed)
Sarah, NP aware of critical low K+ 2.7 & will address in today's office visit. dph

## 2020-05-05 NOTE — Progress Notes (Signed)
No treatment today per MD.  

## 2020-05-05 NOTE — Progress Notes (Signed)
Hematology and Oncology Follow Up Visit  Anita Hoffman [id number] 19-Mar-1961 59 y.o. 05/05/2020   Principle Diagnosis:  Metastatic adenocarcinoma of the cecum-liver metastasis Pulmonary emboli and LEFT gastrocnemius vein thrombus HIV-asymptomatic Iron deficiency secondary to GI bleeding  Current Therapy: FOLFOXIRI - startedon 12/12/2019, s/p cycle#6 Biktarvy 1 p.o. daily Udenycasq post chemo Eliquis 5 mg PO BID IV ironas indicated   Interim History:  Anita Hoffman is here today for follow-up and treatment. She is doing well but states that she did have some fatigue on Saturday.  She has not noted any more blood loss. No abnormal bruising, no petechiae.  She is off oc Lovenox and did not fill the Eliquis that was sent to [facility]. We discussed the importance of her restarting the blood thinner for her PE.  She is concerned about the cost. I have sent her prescription downstairs to our pharmacy and asked that they see if she can get a co-pay card or apply for financial assistance.  She also stopped taking her KDUR and potassium level today is 2.7. She will restart her supplement BID.  CEA last month was down to 77!!!! Level was redrawn today and result pending.  No fever, chills, n/v, cough, rash, dizziness, SOB, chest pain, palpitations or changes in bowel or bladder habits.  She has occasional twinges of abdominal discomfort but states that this is not often and tolerable.  No swelling or tenderness in her extremities.  She has tingling in her hands and toes since her 2nd Covid vaccine. This has not effected her gait or dexterity so far.  No falls or syncopal episodes to report.  She states that her appetite is good and she is staying well hydrated. Unfortunately, her weight is down another 7 lbs since her visit 2 weeks ago.   ECOG Performance Status: 1 - Symptomatic but completely ambulatory  Medications:  Allergies as of 05/05/2020   No Known Allergies       Medication List       Accurate as of May 05, 2020 10:46 AM. If you have any questions, ask your nurse or doctor.        apixaban 5 MG Tabs tablet Commonly known as: ELIQUIS Take 1 tablet (5 mg total) by mouth 2 (two) times daily.   Biktarvy 50-200-25 MG Tabs tablet Generic drug: bictegravir-emtricitabine-tenofovir AF Take 1 tablet by mouth daily.   dexamethasone 4 MG tablet Commonly known as: DECADRON Take 2 tablets (8 mg total) by mouth daily. Start the day after chemotherapy for 3 days. Take with food.   enoxaparin 80 MG/0.8ML injection Commonly known as: LOVENOX Inject 0.8 mLs (80 mg total) into the skin 2 (two) times daily.   folic acid 1 MG tablet Commonly known as: FOLVITE Take 2 tablets (2 mg total) by mouth daily.   hydrocortisone 2.5 % rectal cream Commonly known as: ProctoCare-HC Place 1 application rectally 2 (two) times daily.   lidocaine-prilocaine cream Commonly known as: EMLA Apply to affected area once   loperamide 2 MG tablet Commonly known as: Imodium A-D Take 2 at onset of diarrhea, then 1 every 2hrs until 12hr without a BM. May take 2 tab every 4hrs at bedtime. If diarrhea recurs repeat.   LORazepam 0.5 MG tablet Commonly known as: Ativan Take 1 tablet (0.5 mg total) by mouth every 6 (six) hours as needed for anxiety.   ondansetron 8 MG tablet Commonly known as: Zofran Take 1 tablet (8 mg total) by mouth 2 (two) times daily as needed.  Start on day 3 after chemotherapy.   pantoprazole 40 MG tablet Commonly known as: PROTONIX Take 1 tablet (40 mg total) by mouth 2 (two) times daily.   potassium chloride SA 20 MEQ tablet Commonly known as: KLOR-CON Take 2 tablets (40 mEq total) by mouth 2 (two) times daily.   predniSONE 10 MG tablet Commonly known as: DELTASONE Take 1 tablet (10 mg total) by mouth daily with breakfast.   prochlorperazine 10 MG tablet Commonly known as: COMPAZINE Take 1 tablet (10 mg total) by mouth every 6 (six) hours  as needed (Nausea or vomiting).   traMADol 50 MG tablet Commonly known as: ULTRAM Take 1 tablet (50 mg total) by mouth every 12 (twelve) hours as needed.       Allergies: No Known Allergies  Past Medical History, Surgical history, Social history, and Family History were reviewed and updated.  Review of Systems: All other 10 point review of systems is negative.   Physical Exam:  height is 5\' 2"  (1.575 m) and weight is 120 lb (54.4 kg). Her oral temperature is 97.9 F (36.6 C). Her blood pressure is 104/74 and her pulse is 95. Her respiration is 18 and oxygen saturation is 98%.   Wt Readings from Last 3 Encounters:  05/05/20 120 lb (54.4 kg)  04/23/20 127 lb 4 oz (57.7 kg)  04/21/20 128 lb 12 oz (58.4 kg)    Ocular: Sclerae unicteric, pupils equal, round and reactive to light Ear-nose-throat: Oropharynx clear, dentition fair Lymphatic: No cervical or supraclavicular adenopathy Lungs no rales or rhonchi, good excursion bilaterally Heart regular rate and rhythm, no murmur appreciated Abd soft, nontender, positive bowel sounds MSK no focal spinal tenderness, no joint edema Neuro: non-focal, well-oriented, appropriate affect Breasts:   Lab Results  Component Value Date   WBC 8.5 05/05/2020   HGB 11.0 (L) 05/05/2020   HCT 33.5 (L) 05/05/2020   MCV 102.1 (H) 05/05/2020   PLT 116 (L) 05/05/2020   Lab Results  Component Value Date   FERRITIN 2,183 (H) 04/21/2020   IRON 180 (H) 04/21/2020   TIBC 175 (L) 04/21/2020   UIBC UNABLE TO CALCULATE 04/21/2020   IRONPCTSAT 103 (H) 04/21/2020   Lab Results  Component Value Date   RBC 3.28 (L) 05/05/2020   No results found for: KPAFRELGTCHN, LAMBDASER, KAPLAMBRATIO No results found for: IGGSERUM, IGA, IGMSERUM No results found for: Ronnald Ramp, A1GS, A2GS, Tillman Sers, SPEI   Chemistry      Component Value Date/Time   NA 139 04/23/2020 0935   K 3.6 04/23/2020 0935   CL 103 04/23/2020 0935   CO2  28 04/23/2020 0935   BUN 6 04/23/2020 0935   CREATININE 0.56 04/23/2020 0935   CREATININE 0.65 04/09/2020 1015      Component Value Date/Time   CALCIUM 8.4 (L) 04/23/2020 0935   ALKPHOS 80 04/23/2020 0935   AST 25 04/23/2020 0935   ALT 19 04/23/2020 0935   BILITOT 0.3 04/23/2020 0935       Impression and Plan: AnitaWilliamsis a verypleasant59yoAfrican American with metastatic colon cancer primarily ofthe cecum withextensive liver metastasis. So far she has had a nice response to treatment. CEA has come down tremendously. Result for today is pending.  I spoke with Dr. Marin Olp about her weight loss and for now we will hold treatment.  She verbalized that she will stop downstairs and pick up her Eliquis before going home.  She agreed to also restart KDUR BID. We will repeat CT scans in  2 weeks prior to her follow-up and hopefully resume treatment at that time if her weight has improved.  She can contact our office with any questions or concerns. We can certainly see her sooner if needed.    Laverna Peace, NP 10/4/202110:46 AM

## 2020-05-05 NOTE — Addendum Note (Signed)
Addended by: Tyler Aas A on: 05/05/2020 11:38 AM   Modules accepted: Orders

## 2020-05-05 NOTE — Patient Instructions (Signed)
Implanted Port Insertion, Care After °This sheet gives you information about how to care for yourself after your procedure. Your health care provider may also give you more specific instructions. If you have problems or questions, contact your health care provider. °What can I expect after the procedure? °After the procedure, it is common to have: °· Discomfort at the port insertion site. °· Bruising on the skin over the port. This should improve over 3-4 days. °Follow these instructions at home: °Port care °· After your port is placed, you will get a manufacturer's information card. The card has information about your port. Keep this card with you at all times. °· Take care of the port as told by your health care provider. Ask your health care provider if you or a family member can get training for taking care of the port at home. A home health care nurse may also take care of the port. °· Make sure to remember what type of port you have. °Incision care ° °  ° °· Follow instructions from your health care provider about how to take care of your port insertion site. Make sure you: °? Wash your hands with soap and water before and after you change your bandage (dressing). If soap and water are not available, use hand sanitizer. °? Change your dressing as told by your health care provider. °? Leave stitches (sutures), skin glue, or adhesive strips in place. These skin closures may need to stay in place for 2 weeks or longer. If adhesive strip edges start to loosen and curl up, you may trim the loose edges. Do not remove adhesive strips completely unless your health care provider tells you to do that. °· Check your port insertion site every day for signs of infection. Check for: °? Redness, swelling, or pain. °? Fluid or blood. °? Warmth. °? Pus or a bad smell. °Activity °· Return to your normal activities as told by your health care provider. Ask your health care provider what activities are safe for you. °· Do not  lift anything that is heavier than 10 lb (4.5 kg), or the limit that you are told, until your health care provider says that it is safe. °General instructions °· Take over-the-counter and prescription medicines only as told by your health care provider. °· Do not take baths, swim, or use a hot tub until your health care provider approves. Ask your health care provider if you may take showers. You may only be allowed to take sponge baths. °· Do not drive for 24 hours if you were given a sedative during your procedure. °· Wear a medical alert bracelet in case of an emergency. This will tell any health care providers that you have a port. °· Keep all follow-up visits as told by your health care provider. This is important. °Contact a health care provider if: °· You cannot flush your port with saline as directed, or you cannot draw blood from the port. °· You have a fever or chills. °· You have redness, swelling, or pain around your port insertion site. °· You have fluid or blood coming from your port insertion site. °· Your port insertion site feels warm to the touch. °· You have pus or a bad smell coming from the port insertion site. °Get help right away if: °· You have chest pain or shortness of breath. °· You have bleeding from your port that you cannot control. °Summary °· Take care of the port as told by your health   care provider. Keep the manufacturer's information card with you at all times. °· Change your dressing as told by your health care provider. °· Contact a health care provider if you have a fever or chills or if you have redness, swelling, or pain around your port insertion site. °· Keep all follow-up visits as told by your health care provider. °This information is not intended to replace advice given to you by your health care provider. Make sure you discuss any questions you have with your health care provider. °Document Revised: 02/14/2018 Document Reviewed: 02/14/2018 °Elsevier Patient Education ©  2020 [employer] ° °

## 2020-05-05 NOTE — Telephone Encounter (Signed)
Appointments were previously scheduled at last visit and patient didn't get contrast prior to leaving office today/ 10/4 los

## 2020-05-06 LAB — FERRITIN: Ferritin: 6532 ng/mL — ABNORMAL HIGH (ref 11–307)

## 2020-05-06 LAB — IRON AND TIBC
Iron: 121 ug/dL (ref 41–142)
Saturation Ratios: 70 % — ABNORMAL HIGH (ref 21–57)
TIBC: 173 ug/dL — ABNORMAL LOW (ref 236–444)
UIBC: 52 ug/dL — ABNORMAL LOW (ref 120–384)

## 2020-05-06 LAB — CEA (IN HOUSE-CHCC): CEA (CHCC-In House): 65.13 ng/mL — ABNORMAL HIGH (ref 0.00–5.00)

## 2020-05-07 ENCOUNTER — Inpatient Hospital Stay: Payer: Medicaid Other

## 2020-05-19 ENCOUNTER — Inpatient Hospital Stay: Payer: Medicaid Other

## 2020-05-19 ENCOUNTER — Inpatient Hospital Stay (HOSPITAL_BASED_OUTPATIENT_CLINIC_OR_DEPARTMENT_OTHER): Payer: Medicaid Other | Admitting: Family

## 2020-05-19 ENCOUNTER — Other Ambulatory Visit: Payer: Self-pay

## 2020-05-19 ENCOUNTER — Encounter: Payer: Self-pay | Admitting: Family

## 2020-05-19 ENCOUNTER — Ambulatory Visit (HOSPITAL_BASED_OUTPATIENT_CLINIC_OR_DEPARTMENT_OTHER)
Admission: RE | Admit: 2020-05-19 | Discharge: 2020-05-19 | Disposition: A | Discharge status: Home / Self Care | Payer: Medicaid Other | Source: Ambulatory Visit | Attending: Family | Admitting: Family

## 2020-05-19 VITALS — BP 133/94 | HR 98 | Temp 98.8°F | Resp 16 | Ht 62.0 in | Wt 128.0 lb

## 2020-05-19 DIAGNOSIS — D122 Benign neoplasm of ascending colon: Secondary | ICD-10-CM

## 2020-05-19 DIAGNOSIS — I2699 Other pulmonary embolism without acute cor pulmonale: Secondary | ICD-10-CM

## 2020-05-19 DIAGNOSIS — C787 Secondary malignant neoplasm of liver and intrahepatic bile duct: Secondary | ICD-10-CM

## 2020-05-19 DIAGNOSIS — C180 Malignant neoplasm of cecum: Secondary | ICD-10-CM

## 2020-05-19 DIAGNOSIS — D500 Iron deficiency anemia secondary to blood loss (chronic): Secondary | ICD-10-CM

## 2020-05-19 LAB — CMP (CANCER CENTER ONLY)
ALT: 23 U/L (ref 0–44)
AST: 39 U/L (ref 15–41)
Albumin: 3 g/dL — ABNORMAL LOW (ref 3.5–5.0)
Alkaline Phosphatase: 97 U/L (ref 38–126)
Anion gap: 10 (ref 5–15)
BUN: 8 mg/dL (ref 6–20)
CO2: 26 mmol/L (ref 22–32)
Calcium: 8.1 mg/dL — ABNORMAL LOW (ref 8.9–10.3)
Chloride: 103 mmol/L (ref 98–111)
Creatinine: 0.56 mg/dL (ref 0.44–1.00)
GFR, Estimated: >60 mL/min (ref >60)
Glucose, Bld: 85 mg/dL (ref 70–99)
Potassium: 3.5 mmol/L (ref 3.5–5.1)
Sodium: 139 mmol/L (ref 135–145)
Total Bilirubin: 0.3 mg/dL (ref 0.3–1.2)
Total Protein: 5.9 g/dL — ABNORMAL LOW (ref 6.5–8.1)

## 2020-05-19 LAB — CBC WITH DIFFERENTIAL (CANCER CENTER ONLY)
Abs Immature Granulocytes: 0.09 10*3/uL — ABNORMAL HIGH (ref 0.00–0.07)
Basophils Absolute: 0.1 10*3/uL (ref 0.0–0.1)
Basophils Relative: 1 %
Eosinophils Absolute: 0 10*3/uL (ref 0.0–0.5)
Eosinophils Relative: 0 %
HCT: 32.1 % — ABNORMAL LOW (ref 36.0–46.0)
Hemoglobin: 10.5 g/dL — ABNORMAL LOW (ref 12.0–15.0)
Immature Granulocytes: 1 %
Lymphocytes Relative: 24 %
Lymphs Abs: 3 10*3/uL (ref 0.7–4.0)
MCH: 34 pg (ref 26.0–34.0)
MCHC: 32.7 g/dL (ref 30.0–36.0)
MCV: 103.9 fL — ABNORMAL HIGH (ref 80.0–100.0)
Monocytes Absolute: 1.2 10*3/uL — ABNORMAL HIGH (ref 0.1–1.0)
Monocytes Relative: 9 %
Neutro Abs: 8.2 10*3/uL — ABNORMAL HIGH (ref 1.7–7.7)
Neutrophils Relative %: 65 %
Platelet Count: 317 10*3/uL (ref 150–400)
RBC: 3.09 MIL/uL — ABNORMAL LOW (ref 3.87–5.11)
RDW: 14.5 % (ref 11.5–15.5)
WBC Count: 12.5 10*3/uL — ABNORMAL HIGH (ref 4.0–10.5)
nRBC: 0 % (ref 0.0–0.2)

## 2020-05-19 LAB — LACTATE DEHYDROGENASE: LDH: 382 U/L — ABNORMAL HIGH (ref 98–192)

## 2020-05-19 MED ORDER — IOHEXOL 300 MG/ML  SOLN
100.0000 mL | Freq: Once | INTRAMUSCULAR | Status: AC | PRN
  Administered 2020-05-19: 100 mL via INTRAVENOUS

## 2020-05-19 NOTE — Progress Notes (Signed)
Hematology and Oncology Follow Up Visit  Anita Hoffman [id number] 1961/07/28 59 y.o. 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 daily Udenycasq post chemo Eliquis 5 mg PO BID IV ironas indicated   Interim History:  Anita Hoffman is here today for follow-up. She is doing well and has no complaints at this time.  CEA 2 weeks ago was down to 65! She denies fatigue.  She is taking her prednisone daily with breakfast and her weight is up 8 lbs since we last saw her. She is staying well hydrated. No fever, chills, n/v, cough, rash, dizziness, SOB, chest pain, palpitations, abdominal pain or changes in bowel or bladder habits.  No swelling or tenderness in her extremities.  Numbness and tingling in her hands and toes is mild and unchanged.  No falls or syncope.  No episodes of bleeding. No bruising or petechiae.   ECOG Performance Status: 1 - Symptomatic but completely ambulatory  Medications:  Allergies as of 05/19/2020   No Known Allergies     Medication List       Accurate as of May 19, 2020  1:23 PM. If you have any questions, ask your nurse or doctor.        apixaban 5 MG Tabs tablet Commonly known as: ELIQUIS Take 1 tablet (5 mg total) by mouth 2 (two) times daily.   Biktarvy 50-200-25 MG Tabs tablet Generic drug: bictegravir-emtricitabine-tenofovir AF Take 1 tablet by mouth daily.   dexamethasone 4 MG tablet Commonly known as: DECADRON Take 2 tablets (8 mg total) by mouth daily. Start the day after chemotherapy for 3 days. Take with food.   folic acid 1 MG tablet Commonly known as: FOLVITE Take 2 tablets (2 mg total) by mouth daily.   hydrocortisone 2.5 % rectal cream Commonly known as:  ProctoCare-HC Place 1 application rectally 2 (two) times daily.   lidocaine-prilocaine cream Commonly known as: EMLA Apply to affected area once   loperamide 2 MG tablet Commonly known as: Imodium A-D Take 2 at onset of diarrhea, then 1 every 2hrs until 12hr without a BM. May take 2 tab every 4hrs at bedtime. If diarrhea recurs repeat.   LORazepam 0.5 MG tablet Commonly known as: Ativan Take 1 tablet (0.5 mg total) by mouth every 6 (six) hours as needed for anxiety.   ondansetron 8 MG tablet Commonly known as: Zofran Take 1 tablet (8 mg total) by mouth 2 (two) times daily as needed. Start on day 3 after chemotherapy.   pantoprazole 40 MG tablet Commonly known as: PROTONIX Take 1 tablet (40 mg total) by mouth 2 (two) times daily.   potassium chloride SA 20 MEQ tablet Commonly known as: KLOR-CON Take 2 tablets (40 mEq total) by mouth 2 (two) times daily.   predniSONE 10 MG tablet Commonly known as: DELTASONE Take 1 tablet (10 mg total) by mouth daily with breakfast.   prochlorperazine 10 MG tablet Commonly known as: COMPAZINE Take 1 tablet (10 mg total) by mouth every 6 (six) hours as needed (Nausea or vomiting).   traMADol 50 MG tablet Commonly known as: ULTRAM Take 1 tablet (50 mg total) by mouth every 12 (twelve) hours as needed.       Allergies: No Known Allergies  Past Medical History, Surgical history, Social history, and Family History were reviewed and updated.  Review of Systems: All other 10 point review of systems is negative.   Physical Exam:  height is 5\' 2"  (1.575 m) and weight is 128 lb (58.1 kg). Her oral temperature is 98.8 F (37.1 C). Her blood pressure is 133/94 (abnormal) and her pulse is 98. Her respiration is 16 and oxygen saturation is 100%.   Wt Readings from Last 3 Encounters:  05/19/20 128 lb (58.1 kg)  05/05/20 120 lb (54.4 kg)  04/23/20 127 lb 4 oz (57.7 kg)    Ocular: Sclerae unicteric, pupils equal, round and reactive to  light Ear-nose-throat: Oropharynx clear, dentition fair Lymphatic: No cervical, supraclavicular or axillary adenopathy Lungs no rales or rhonchi, good excursion bilaterally Heart regular rate and rhythm, no murmur appreciated Abd soft, nontender, positive bowel sounds, no liver or spleen tip palpated on exam, no fluid wave  MSK no focal spinal tenderness, no joint edema Neuro: non-focal, well-oriented, appropriate affect Breasts: Deferred   Lab Results  Component Value Date   WBC 12.5 (H) 05/19/2020   HGB 10.5 (L) 05/19/2020   HCT 32.1 (L) 05/19/2020   MCV 103.9 (H) 05/19/2020   PLT 317 05/19/2020   Lab Results  Component Value Date   FERRITIN 6,532 (H) 05/05/2020   IRON 121 05/05/2020   TIBC 173 (L) 05/05/2020   UIBC 52 (L) 05/05/2020   IRONPCTSAT 70 (H) 05/05/2020   Lab Results  Component Value Date   RBC 3.09 (L) 05/19/2020   No results found for: KPAFRELGTCHN, LAMBDASER, KAPLAMBRATIO No results found for: IGGSERUM, IGA, IGMSERUM No results found for: Ronnald Ramp, A1GS, A2GS, Violet Baldy, MSPIKE, SPEI   Chemistry      Component Value Date/Time   NA 139 05/19/2020 1209   K 3.5 05/19/2020 1209   CL 103 05/19/2020 1209   CO2 26 05/19/2020 1209   BUN 8 05/19/2020 1209   CREATININE 0.56 05/19/2020 1209   CREATININE 0.65 04/09/2020 1015      Component Value Date/Time   CALCIUM 8.1 (L) 05/19/2020 1209   ALKPHOS 97 05/19/2020 1209   AST 39 05/19/2020 1209   ALT 23 05/19/2020 1209   BILITOT 0.3 05/19/2020 1209       Impression and Plan: AnitaWilliamsis a verypleasant59yoAfrican American with metastatic colon cancer primarily ofthe cecum withextensive liver metastasis. She was over 3 hours late for her appointment today so we went ahead and did labs scan and visit and will do treatment on Wednesday.  Results for scan are pending. Will go over results with her when available.  We will plan to see her again in another 2 weeks.  She was  encouraged to contact our office with any questions or concerns.   Laverna Peace, NP 10/18/20211:23 PM

## 2020-05-19 NOTE — Progress Notes (Signed)
Pt discharged in no apparent distress. Pt left ambulatory without assistance. Pt aware of discharge instructions and verbalized understanding and had no further questions.  

## 2020-05-19 NOTE — Patient Instructions (Signed)

## 2020-05-20 ENCOUNTER — Telehealth: Payer: Self-pay | Admitting: *Deleted

## 2020-05-20 ENCOUNTER — Other Ambulatory Visit: Payer: Self-pay

## 2020-05-20 DIAGNOSIS — Z20822 Contact with and (suspected) exposure to covid-19: Secondary | ICD-10-CM

## 2020-05-20 LAB — CEA (IN HOUSE-CHCC): CEA (CHCC-In House): 69.84 ng/mL — ABNORMAL HIGH (ref 0.00–5.00)

## 2020-05-20 LAB — FERRITIN: Ferritin: 2265 ng/mL — ABNORMAL HIGH (ref 11–307)

## 2020-05-20 LAB — IRON AND TIBC
Iron: 143 ug/dL — ABNORMAL HIGH (ref 41–142)
Saturation Ratios: 104 % — ABNORMAL HIGH (ref 21–57)
TIBC: 138 ug/dL — ABNORMAL LOW (ref 236–444)
UIBC: UNDETERMINED ug/dL (ref 120–384)

## 2020-05-20 NOTE — Telephone Encounter (Signed)
Unable to reach pt this morning, so call placed to patient's mother, to inform her that pt needs to be tested for Covid-19 today d/t CT Scan from 05/19/20 is suspicious for Covid pneumonia per S. Rothsay NP. Appt made for pt to be tested at [facility] today at 10:00 per pt.'s mothers request.  Jory Ee NP notified.

## 2020-05-20 NOTE — Telephone Encounter (Signed)
No answer on patient's phone.  Call placed to patient's mother and message left to notify her that appts for tomorrow, 05/21/20 and Friday, 05/23/20 will be canceled until covid results have returned.  Instructed pt.'s mother to call office back to confirm that this message was received.

## 2020-05-21 ENCOUNTER — Inpatient Hospital Stay: Payer: Medicaid Other

## 2020-05-21 ENCOUNTER — Ambulatory Visit: Payer: Self-pay

## 2020-05-21 LAB — NOVEL CORONAVIRUS, NAA: SARS-CoV-2, NAA: NOT DETECTED

## 2020-05-21 LAB — SARS-COV-2, NAA 2 DAY TAT

## 2020-05-22 ENCOUNTER — Other Ambulatory Visit: Payer: Self-pay | Admitting: Family

## 2020-05-22 DIAGNOSIS — C180 Malignant neoplasm of cecum: Secondary | ICD-10-CM

## 2020-05-22 DIAGNOSIS — J189 Pneumonia, unspecified organism: Secondary | ICD-10-CM

## 2020-05-22 DIAGNOSIS — B20 Human immunodeficiency virus [HIV] disease: Secondary | ICD-10-CM

## 2020-05-22 NOTE — Progress Notes (Signed)
Called Dr. Lucianne Lei Dam's office but he is out of the office until next week. I was able to speak with on-call Dr. Gale Journey regarding patients CT scan results and possible pneumonia. Patient remains asymptomatic at this time and Covid testing was negative.  He would like [place] to go ahead and draw a procalcitonin level, blood cultures x 2 and collect a sputum culture today and then Dr. Tommy Medal will see her next week. No antibiotic therapy needed at this time. Patient is aware and coordinating ride into our office and has been scheduled next week with Dr. Tommy Medal. She was encouraged to contact either office or go to the ED in the event of an emergency if she does become symptomatic with infection. She verbalized understanding. No other questions at this time.

## 2020-05-23 ENCOUNTER — Other Ambulatory Visit: Payer: Self-pay

## 2020-05-23 ENCOUNTER — Inpatient Hospital Stay: Payer: Medicaid Other

## 2020-05-23 VITALS — BP 122/88 | HR 86 | Temp 99.3°F | Resp 18

## 2020-05-23 DIAGNOSIS — B20 Human immunodeficiency virus [HIV] disease: Secondary | ICD-10-CM

## 2020-05-23 DIAGNOSIS — C180 Malignant neoplasm of cecum: Secondary | ICD-10-CM

## 2020-05-23 DIAGNOSIS — D490 Neoplasm of unspecified behavior of digestive system: Secondary | ICD-10-CM

## 2020-05-23 DIAGNOSIS — D122 Benign neoplasm of ascending colon: Secondary | ICD-10-CM

## 2020-05-23 DIAGNOSIS — C787 Secondary malignant neoplasm of liver and intrahepatic bile duct: Secondary | ICD-10-CM

## 2020-05-23 DIAGNOSIS — Z95828 Presence of other vascular implants and grafts: Secondary | ICD-10-CM

## 2020-05-23 DIAGNOSIS — J189 Pneumonia, unspecified organism: Secondary | ICD-10-CM

## 2020-05-23 LAB — CBC WITH DIFFERENTIAL (CANCER CENTER ONLY)
Abs Immature Granulocytes: 0.05 10*3/uL (ref 0.00–0.07)
Basophils Absolute: 0 10*3/uL (ref 0.0–0.1)
Basophils Relative: 0 %
Eosinophils Absolute: 0 10*3/uL (ref 0.0–0.5)
Eosinophils Relative: 0 %
HCT: 30 % — ABNORMAL LOW (ref 36.0–46.0)
Hemoglobin: 10.1 g/dL — ABNORMAL LOW (ref 12.0–15.0)
Immature Granulocytes: 1 %
Lymphocytes Relative: 34 %
Lymphs Abs: 3.4 10*3/uL (ref 0.7–4.0)
MCH: 34.6 pg — ABNORMAL HIGH (ref 26.0–34.0)
MCHC: 33.7 g/dL (ref 30.0–36.0)
MCV: 102.7 fL — ABNORMAL HIGH (ref 80.0–100.0)
Monocytes Absolute: 0.9 10*3/uL (ref 0.1–1.0)
Monocytes Relative: 9 %
Neutro Abs: 5.5 10*3/uL (ref 1.7–7.7)
Neutrophils Relative %: 56 %
Platelet Count: 247 10*3/uL (ref 150–400)
RBC: 2.92 MIL/uL — ABNORMAL LOW (ref 3.87–5.11)
RDW: 14.4 % (ref 11.5–15.5)
WBC Count: 9.9 10*3/uL (ref 4.0–10.5)
nRBC: 0 % (ref 0.0–0.2)

## 2020-05-23 LAB — EXPECTORATED SPUTUM ASSESSMENT W GRAM STAIN, RFLX TO RESP C

## 2020-05-23 LAB — CMP (CANCER CENTER ONLY)
ALT: 21 U/L (ref 0–44)
AST: 24 U/L (ref 15–41)
Albumin: 2.9 g/dL — ABNORMAL LOW (ref 3.5–5.0)
Alkaline Phosphatase: 82 U/L (ref 38–126)
Anion gap: 8 (ref 5–15)
BUN: 8 mg/dL (ref 6–20)
CO2: 30 mmol/L (ref 22–32)
Calcium: 8.3 mg/dL — ABNORMAL LOW (ref 8.9–10.3)
Chloride: 103 mmol/L (ref 98–111)
Creatinine: 0.55 mg/dL (ref 0.44–1.00)
GFR, Estimated: >60 mL/min (ref >60)
Glucose, Bld: 78 mg/dL (ref 70–99)
Potassium: 3.3 mmol/L — ABNORMAL LOW (ref 3.5–5.1)
Sodium: 141 mmol/L (ref 135–145)
Total Bilirubin: 0.5 mg/dL (ref 0.3–1.2)
Total Protein: 5.6 g/dL — ABNORMAL LOW (ref 6.5–8.1)

## 2020-05-23 MED ORDER — HEPARIN SOD (PORK) LOCK FLUSH 100 UNIT/ML IV SOLN
500.0000 [IU] | Freq: Once | INTRAVENOUS | Status: AC
  Administered 2020-05-23: 500 [IU] via INTRAVENOUS
  Filled 2020-05-23: qty 5

## 2020-05-23 MED ORDER — SODIUM CHLORIDE 0.9% FLUSH
10.0000 mL | Freq: Once | INTRAVENOUS | Status: AC
  Administered 2020-05-23: 10 mL via INTRAVENOUS
  Filled 2020-05-23: qty 10

## 2020-05-23 NOTE — Progress Notes (Signed)
Pt discharged in no apparent distress. Pt left ambulatory without assistance. Pt aware of discharge instructions and verbalized understanding and had no further questions.  

## 2020-05-25 LAB — CULTURE, RESPIRATORY W GRAM STAIN: Culture: NORMAL

## 2020-05-27 ENCOUNTER — Other Ambulatory Visit: Payer: Self-pay | Admitting: Family

## 2020-05-28 ENCOUNTER — Inpatient Hospital Stay: Payer: Self-pay | Admitting: Infectious Disease

## 2020-05-28 LAB — CULTURE, BLOOD (SINGLE)
Culture: NO GROWTH
Culture: NO GROWTH

## 2020-06-04 ENCOUNTER — Encounter: Payer: Self-pay | Admitting: Infectious Disease

## 2020-06-04 ENCOUNTER — Inpatient Hospital Stay: Payer: Medicaid Other

## 2020-06-04 ENCOUNTER — Inpatient Hospital Stay: Payer: Medicaid Other | Admitting: Family

## 2020-06-04 ENCOUNTER — Ambulatory Visit (INDEPENDENT_AMBULATORY_CARE_PROVIDER_SITE_OTHER): Payer: Medicaid Other | Admitting: Infectious Disease

## 2020-06-04 ENCOUNTER — Inpatient Hospital Stay: Payer: Medicaid Other | Attending: Hematology & Oncology

## 2020-06-04 ENCOUNTER — Other Ambulatory Visit: Payer: Self-pay

## 2020-06-04 VITALS — BP 119/88 | HR 109 | Resp 16 | Ht 62.0 in | Wt 121.0 lb

## 2020-06-04 DIAGNOSIS — Z79899 Other long term (current) drug therapy: Secondary | ICD-10-CM | POA: Insufficient documentation

## 2020-06-04 DIAGNOSIS — Z5189 Encounter for other specified aftercare: Secondary | ICD-10-CM | POA: Insufficient documentation

## 2020-06-04 DIAGNOSIS — Z21 Asymptomatic human immunodeficiency virus [HIV] infection status: Secondary | ICD-10-CM | POA: Insufficient documentation

## 2020-06-04 DIAGNOSIS — I2699 Other pulmonary embolism without acute cor pulmonale: Secondary | ICD-10-CM | POA: Insufficient documentation

## 2020-06-04 DIAGNOSIS — J129 Viral pneumonia, unspecified: Secondary | ICD-10-CM

## 2020-06-04 DIAGNOSIS — C180 Malignant neoplasm of cecum: Secondary | ICD-10-CM | POA: Diagnosis not present

## 2020-06-04 DIAGNOSIS — D500 Iron deficiency anemia secondary to blood loss (chronic): Secondary | ICD-10-CM | POA: Insufficient documentation

## 2020-06-04 DIAGNOSIS — R634 Abnormal weight loss: Secondary | ICD-10-CM | POA: Diagnosis not present

## 2020-06-04 DIAGNOSIS — Z5111 Encounter for antineoplastic chemotherapy: Secondary | ICD-10-CM | POA: Insufficient documentation

## 2020-06-04 DIAGNOSIS — B20 Human immunodeficiency virus [HIV] disease: Secondary | ICD-10-CM | POA: Diagnosis not present

## 2020-06-04 DIAGNOSIS — K922 Gastrointestinal hemorrhage, unspecified: Secondary | ICD-10-CM | POA: Insufficient documentation

## 2020-06-04 DIAGNOSIS — E876 Hypokalemia: Secondary | ICD-10-CM | POA: Diagnosis not present

## 2020-06-04 DIAGNOSIS — C787 Secondary malignant neoplasm of liver and intrahepatic bile duct: Secondary | ICD-10-CM | POA: Diagnosis not present

## 2020-06-04 DIAGNOSIS — I82462 Acute embolism and thrombosis of left calf muscular vein: Secondary | ICD-10-CM | POA: Insufficient documentation

## 2020-06-04 MED ORDER — BIKTARVY 50-200-25 MG PO TABS: 1.0000 | ORAL_TABLET | Freq: Every day | ORAL | 11 refills | Status: AC

## 2020-06-04 NOTE — Progress Notes (Signed)
Subjective:  Chief complaint: Follow-up for HIV disease metastatic colon cancer and recent CT scan showing ground glass opacities  Patient ID: Anita Hoffman, female    DOB: 05/19/1961, 59 y.o.   MRN: [id number]  HPI  Anita Hoffman is a 52y ear old African American woman recently diagnosed with HIV disease (with healthy CD4 count and not that high of a viral load) but also with metastatic colon cancer and pulmonary emboli.  She is seeing Dr. Marin Olp with Oncology and receiving chemotherapy with FOLFOX.  We did same day initiation with Biktarvy in the hospital which she has tolerated without any problems.   Her malignancy is responding to chemotherapy and she is following closely with Dr. Marin Olp.  She is on Eliquis now for anticoagulation and K supplementation  Her viral load is undetectable and her CD4 count was healthy and we checked it last.   She had nice response with CEA coming down. CT abdomen and pelvis was done and this showed reduction in size of hepatic mets.  It also showed bilateral ground glass opacities on CT scan concerning for "atypical PNA" and viral PNA suggested by radiology. My partner Dr. Gale Journey was called and he asked them to get COVID test = negative, blood cultures and sputum cultures but he doubted bacterial pna based on CT scan.  Sputum showed normal oral flora and blood cultures sterile.  She had chemo paused due to weight loss but then seems to have gained some weight and then lost again. It appears she was supposed to see Oncology today but she states she never received phone call from them as she typically does.  Her Medicaid was approved!  She has no cough or fevers or malaise.    Past Medical History:  Diagnosis Date  . Former smoker   . Goals of care, counseling/discussion 12/06/2019  . HIV infection ([place])   . Iron deficiency anemia due to chronic blood loss 12/12/2019  . Medical history non-contributory     Past Surgical History:  Procedure  Laterality Date  . BIOPSY  12/01/2019   Procedure: BIOPSY;  Surgeon: Lavena Bullion, DO;  Location: [facility];  Service: Gastroenterology;;  . COLONOSCOPY WITH PROPOFOL N/A 12/01/2019   Procedure: COLONOSCOPY WITH PROPOFOL;  Surgeon: Lavena Bullion, DO;  Location: [facility];  Service: Gastroenterology;  Laterality: N/A;  . HEMOSTASIS CLIP PLACEMENT  12/01/2019   Procedure: HEMOSTASIS CLIP PLACEMENT;  Surgeon: Lavena Bullion, DO;  Location: [place];  Service: Gastroenterology;;  . IR IMAGING GUIDED PORT INSERTION  12/06/2019  . POLYPECTOMY  12/01/2019   Procedure: POLYPECTOMY;  Surgeon: Lavena Bullion, DO;  Location: [facility];  Service: Gastroenterology;;  . SUBMUCOSAL TATTOO INJECTION  12/01/2019   Procedure: SUBMUCOSAL TATTOO INJECTION;  Surgeon: Lavena Bullion, DO;  Location: [facility];  Service: Gastroenterology;;  . TUBAL LIGATION      Family History  Problem Relation Age of Onset  . Ovarian cancer Maternal Grandmother   . Lung cancer Father       Social History   Socioeconomic History  . Marital status: Single    Spouse name: Not on file  . Number of children: 5  . Years of education: Not on file  . Highest education level: Not on file  Occupational History  . Not on file  Tobacco Use  . Smoking status: Former Smoker    Packs/day: 0.50    Types: Cigarettes    Quit date: 11/29/2003    Years since quitting: 16.5  .  Smokeless tobacco: Never Used  Vaping Use  . Vaping Use: Never used  Substance and Sexual Activity  . Alcohol use: Not Currently    Comment: 1990  . Drug use: Yes    Types: Marijuana    Comment: 1990  . Sexual activity: Not Currently  Other Topics Concern  . Not on file  Social History Narrative  . Not on file   Social Determinants of Health   Financial Resource Strain:   . Difficulty of Paying Living Expenses: Not on file  Food Insecurity:   . Worried About Charity fundraiser in the Last Year: Not on file  . Ran Out  of Food in the Last Year: Not on file  Transportation Needs:   . Lack of Transportation (Medical): Not on file  . Lack of Transportation (Non-Medical): Not on file  Physical Activity:   . Days of Exercise per Week: Not on file  . Minutes of Exercise per Session: Not on file  Stress:   . Feeling of Stress : Not on file  Social Connections:   . Frequency of Communication with Friends and Family: Not on file  . Frequency of Social Gatherings with Friends and Family: Not on file  . Attends Religious Services: Not on file  . Active Member of Clubs or Organizations: Not on file  . Attends Archivist Meetings: Not on file  . Marital Status: Not on file    No Known Allergies   Current Outpatient Medications:  .  apixaban (ELIQUIS) 5 MG TABS tablet, Take 1 tablet (5 mg total) by mouth 2 (two) times daily., Disp: 60 tablet, Rfl: 5 .  bictegravir-emtricitabine-tenofovir AF (BIKTARVY) 50-200-25 MG TABS tablet, Take 1 tablet by mouth daily., Disp: 30 tablet, Rfl: 6 .  dexamethasone (DECADRON) 4 MG tablet, Take 2 tablets (8 mg total) by mouth daily. Start the day after chemotherapy for 3 days. Take with food., Disp: 8 tablet, Rfl: 5 .  hydrocortisone (PROCTOCARE-HC) 2.5 % rectal cream, Place 1 application rectally 2 (two) times daily., Disp: 30 g, Rfl: 0 .  ondansetron (ZOFRAN) 8 MG tablet, Take 1 tablet (8 mg total) by mouth 2 (two) times daily as needed. Start on day 3 after chemotherapy., Disp: 30 tablet, Rfl: 1 .  potassium chloride SA (KLOR-CON) 20 MEQ tablet, Take 2 tablets (40 mEq total) by mouth 2 (two) times daily., Disp: 120 tablet, Rfl: 1  Review of Systems  Constitutional: Positive for unexpected weight change. Negative for activity change, appetite change, chills, diaphoresis, fatigue and fever.  HENT: Negative for congestion, rhinorrhea, sinus pressure, sneezing, sore throat and trouble swallowing.   Eyes: Negative for photophobia and visual disturbance.  Respiratory:  Negative for cough, chest tightness, shortness of breath, wheezing and stridor.   Cardiovascular: Negative for chest pain, palpitations and leg swelling.  Gastrointestinal: Negative for abdominal distention, abdominal pain, anal bleeding, blood in stool, constipation, diarrhea, nausea and vomiting.  Genitourinary: Negative for difficulty urinating, dysuria, flank pain and hematuria.  Musculoskeletal: Negative for arthralgias, back pain, gait problem, joint swelling and myalgias.  Skin: Negative for color change, pallor, rash and wound.  Neurological: Negative for dizziness, tremors, weakness and light-headedness.  Hematological: Negative for adenopathy. Does not bruise/bleed easily.  Psychiatric/Behavioral: Negative for agitation, behavioral problems, confusion, decreased concentration, dysphoric mood, self-injury and sleep disturbance.       Objective:   Physical Exam Constitutional:      General: She is not in acute distress.    Appearance: She is  not diaphoretic.  HENT:     Head: Normocephalic and atraumatic.     Right Ear: External ear normal.     Left Ear: External ear normal.     Nose: Nose normal.     Mouth/Throat:     Pharynx: No oropharyngeal exudate.  Eyes:     General: No scleral icterus.    Conjunctiva/sclera: Conjunctivae normal.     Pupils: Pupils are equal, round, and reactive to light.  Cardiovascular:     Rate and Rhythm: Normal rate and regular rhythm.     Heart sounds: No murmur heard.  No friction rub. No gallop.   Pulmonary:     Effort: Pulmonary effort is normal. No respiratory distress.     Breath sounds: Normal breath sounds. No stridor. No wheezing or rhonchi.  Abdominal:     General: Bowel sounds are normal.     Palpations: Abdomen is soft.     Tenderness: There is no abdominal tenderness. There is no rebound.  Musculoskeletal:        General: No tenderness. Normal range of motion.     Cervical back: Normal range of motion and neck supple.   Lymphadenopathy:     Cervical: No cervical adenopathy.  Skin:    General: Skin is warm and dry.     Coloration: Skin is not pale.     Findings: No erythema or rash.  Neurological:     General: No focal deficit present.     Mental Status: She is alert and oriented to person, place, and time. Mental status is at baseline.     Coordination: Coordination normal.  Psychiatric:        Mood and Affect: Mood normal.        Behavior: Behavior normal.        Thought Content: Thought content normal.        Judgment: Judgment normal.     Port is clean dry and intact      Assessment & Plan:  HIV disease well-controlled, continue Biktarvy and now with Medicaid. She can keep followup with me in December.  PEs: on anticoagulation   Metastatic colon cancer: responded to chemotherapy  ? Pneumonia: ground glass opacities at bases on CT abdomen on October 18th. If this was pneumonia was likely viral because she had no symptoms then and none now and she has not received antibacterial therapy  WEight loss: not sure what has driven that but she seems to also regain weight  Hypokalemia: may need to take more KCL, will defer to persons managing this

## 2020-06-05 ENCOUNTER — Telehealth: Payer: Self-pay | Admitting: Family

## 2020-06-05 NOTE — Telephone Encounter (Signed)
I tried calling patient however I was unable to reach ehr by phone.  I then contacted her mother and advised her of appointments that I have rescheduled for her daughter for next week 11/10 I advised her that she needed to be here by 8:00.  She stated she would be sure patient was here at 8:00

## 2020-06-06 ENCOUNTER — Inpatient Hospital Stay: Payer: Medicaid Other

## 2020-06-10 ENCOUNTER — Emergency Department (HOSPITAL_COMMUNITY)
Admission: EM | Admit: 2020-06-10 | Discharge: 2020-06-10 | Disposition: A | Discharge status: Home / Self Care | Payer: Medicaid Other | Attending: Emergency Medicine | Admitting: Emergency Medicine

## 2020-06-10 ENCOUNTER — Other Ambulatory Visit: Payer: Self-pay

## 2020-06-10 ENCOUNTER — Encounter (HOSPITAL_COMMUNITY): Payer: Self-pay | Admitting: Emergency Medicine

## 2020-06-10 DIAGNOSIS — B20 Human immunodeficiency virus [HIV] disease: Secondary | ICD-10-CM | POA: Insufficient documentation

## 2020-06-10 DIAGNOSIS — K2970 Gastritis, unspecified, without bleeding: Secondary | ICD-10-CM | POA: Diagnosis not present

## 2020-06-10 DIAGNOSIS — Z7901 Long term (current) use of anticoagulants: Secondary | ICD-10-CM | POA: Diagnosis not present

## 2020-06-10 DIAGNOSIS — Z87891 Personal history of nicotine dependence: Secondary | ICD-10-CM | POA: Diagnosis not present

## 2020-06-10 DIAGNOSIS — E860 Dehydration: Secondary | ICD-10-CM | POA: Diagnosis present

## 2020-06-10 LAB — URINALYSIS, ROUTINE W REFLEX MICROSCOPIC
Bilirubin Urine: NEGATIVE
Glucose, UA: NEGATIVE mg/dL
Hgb urine dipstick: NEGATIVE
Ketones, ur: 20 mg/dL — AB
Nitrite: NEGATIVE
Protein, ur: NEGATIVE mg/dL
Specific Gravity, Urine: 1.018 (ref 1.005–1.030)
pH: 7 (ref 5.0–8.0)

## 2020-06-10 LAB — COMPREHENSIVE METABOLIC PANEL
ALT: 13 U/L (ref 0–44)
AST: 22 U/L (ref 15–41)
Albumin: 2.4 g/dL — ABNORMAL LOW (ref 3.5–5.0)
Alkaline Phosphatase: 118 U/L (ref 38–126)
Anion gap: 13 (ref 5–15)
BUN: 10 mg/dL (ref 6–20)
CO2: 26 mmol/L (ref 22–32)
Calcium: 8.3 mg/dL — ABNORMAL LOW (ref 8.9–10.3)
Chloride: 99 mmol/L (ref 98–111)
Creatinine, Ser: 0.73 mg/dL (ref 0.44–1.00)
GFR, Estimated: >60 mL/min (ref >60)
Glucose, Bld: 91 mg/dL (ref 70–99)
Potassium: 3.4 mmol/L — ABNORMAL LOW (ref 3.5–5.1)
Sodium: 138 mmol/L (ref 135–145)
Total Bilirubin: 0.9 mg/dL (ref 0.3–1.2)
Total Protein: 5.6 g/dL — ABNORMAL LOW (ref 6.5–8.1)

## 2020-06-10 LAB — CBC
HCT: 37.8 % (ref 36.0–46.0)
Hemoglobin: 12.2 g/dL (ref 12.0–15.0)
MCH: 33.8 pg (ref 26.0–34.0)
MCHC: 32.3 g/dL (ref 30.0–36.0)
MCV: 104.7 fL — ABNORMAL HIGH (ref 80.0–100.0)
Platelets: 379 10*3/uL (ref 150–400)
RBC: 3.61 MIL/uL — ABNORMAL LOW (ref 3.87–5.11)
RDW: 13.6 % (ref 11.5–15.5)
WBC: 10.9 10*3/uL — ABNORMAL HIGH (ref 4.0–10.5)
nRBC: 0 % (ref 0.0–0.2)

## 2020-06-10 LAB — LIPASE, BLOOD: Lipase: 22 U/L (ref 11–51)

## 2020-06-10 MED ORDER — SODIUM CHLORIDE 0.9% FLUSH: 10.0000 mL | INTRAVENOUS | Status: DC | PRN

## 2020-06-10 MED ORDER — SODIUM CHLORIDE 0.9 % IV BOLUS
1000.0000 mL | Freq: Once | INTRAVENOUS | Status: AC
  Administered 2020-06-10: 1000 mL via INTRAVENOUS

## 2020-06-10 MED ORDER — SODIUM CHLORIDE 0.9% FLUSH: 10.0000 mL | Freq: Two times a day (BID) | INTRAVENOUS | Status: DC

## 2020-06-10 MED ORDER — CHLORHEXIDINE GLUCONATE CLOTH 2 % EX PADS: 6.0000 | MEDICATED_PAD | Freq: Every day | CUTANEOUS | Status: DC

## 2020-06-10 MED ORDER — HEPARIN SOD (PORK) LOCK FLUSH 100 UNIT/ML IV SOLN
500.0000 [IU] | Freq: Once | INTRAVENOUS | Status: AC
  Administered 2020-06-10: 500 [IU] via INTRAVENOUS

## 2020-06-10 MED ORDER — PANTOPRAZOLE SODIUM 40 MG IV SOLR
40.0000 mg | Freq: Once | INTRAVENOUS | Status: AC
  Administered 2020-06-10: 40 mg via INTRAVENOUS
  Filled 2020-06-10: qty 40

## 2020-06-10 MED ORDER — SODIUM CHLORIDE 0.9 % IV BOLUS
500.0000 mL | Freq: Once | INTRAVENOUS | Status: AC
  Administered 2020-06-10: 500 mL via INTRAVENOUS

## 2020-06-10 MED ORDER — PANTOPRAZOLE SODIUM 20 MG PO TBEC: 20.0000 mg | DELAYED_RELEASE_TABLET | Freq: Every day | ORAL | 1 refills | Status: DC

## 2020-06-10 NOTE — ED Notes (Signed)
Pt became very dizzy throughout orthostatic vitals signs. Pt "plopped" down on the bed once she was able to sit down once vitals signs were complete because of dizziness.

## 2020-06-10 NOTE — Discharge Instructions (Signed)
We gave you fluids through the IV because you appear to be dehydrated.  Try to drink more fluids and eat regularly.  We are treating you with Protonix, for possible gastritis causing your upper abdominal discomfort.  Start the prescription tomorrow.  It was sent to your pharmacy.  Follow-up with your oncologist as scheduled.

## 2020-06-10 NOTE — ED Triage Notes (Signed)
Pt BIB [facility]. Complaint of dehydration. Pt states she hasnt drank enough water yesterday and today.

## 2020-06-10 NOTE — ED Provider Notes (Signed)
Anita Hoffman Provider Note   CSN: [id number] Arrival date & time: 06/10/20  1507     History Chief Complaint  Patient presents with  . Dehydration  . Abdominal Pain    Anita Hoffman is a 59 y.o. female.  HPI She presents by EMS for evaluation of a sensation of dehydration.  She was recently diagnosed with HIV disease, and is currently being treated for metastatic colon cancer.  She has history of pulmonary embolus, and is anticoagulated.  Last chemotherapy was 1 month ago.  She reports difficulty with eating for greater than 1 year.  She states she has a history of stomach acid but is not currently taking medicine for it because she "ran out."  She denies vomiting, fever, shortness of breath, chest pain, new abdominal pain.  There are no other known modifying factors.    Past Medical History:  Diagnosis Date  . Former smoker   . Goals of care, counseling/discussion 12/06/2019  . HIV infection ([place])   . Iron deficiency anemia due to chronic blood loss 12/12/2019  . Medical history non-contributory     Patient Active Problem List   Diagnosis Date Noted  . 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 Rectal bleeding 12/13/2019  . Bright red rectal bleeding 12/13/2019  . Iron deficiency anemia due to chronic blood loss 12/12/2019  . Goals of care, counseling/discussion 12/06/2019  . HIV disease ([place])   . Adenomatous polyp of ascending colon   . Adenomatous polyp of sigmoid colon   . Metastatic malignant neoplasm ([place])   . Bilateral pulmonary embolism ([place]) 11/29/2019  . 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 Liver metastases ([place]) 11/29/2019  . Elevated troponin 11/29/2019  . ANXIETY, SITUATIONAL 10/13/2009  . PALPITATIONS, RECURRENT 10/03/2009  . TOBACCO USE, QUIT 09/29/2006    Past Surgical History:  Procedure Laterality Date  . BIOPSY  12/01/2019   Procedure: BIOPSY;  Surgeon: Lavena Bullion, DO;  Location: [facility];  Service:  Gastroenterology;;  . COLONOSCOPY WITH PROPOFOL N/A 12/01/2019   Procedure: COLONOSCOPY WITH PROPOFOL;  Surgeon: Lavena Bullion, DO;  Location: [facility];  Service: Gastroenterology;  Laterality: N/A;  . HEMOSTASIS CLIP PLACEMENT  12/01/2019   Procedure: HEMOSTASIS CLIP PLACEMENT;  Surgeon: Lavena Bullion, DO;  Location: [facility];  Service: Gastroenterology;;  . IR IMAGING GUIDED PORT INSERTION  12/06/2019  . POLYPECTOMY  12/01/2019   Procedure: POLYPECTOMY;  Surgeon: Lavena Bullion, DO;  Location: [facility];  Service: Gastroenterology;;  . SUBMUCOSAL TATTOO INJECTION  12/01/2019   Procedure: SUBMUCOSAL TATTOO INJECTION;  Surgeon: Lavena Bullion, DO;  Location: [facility];  Service: Gastroenterology;;  . TUBAL LIGATION       OB History   No obstetric history on file.     Family History  Problem Relation Age of Onset  . Ovarian cancer Maternal Grandmother   . Lung cancer Father     Social History   Tobacco Use  . Smoking status: Former Smoker    Packs/day: 0.50    Types: Cigarettes    Quit date: 11/29/2003    Years since quitting: 16.5  . Smokeless tobacco: Never Used  Vaping Use  . Vaping Use: Never used  Substance Use Topics  . Alcohol use: Not Currently    Comment: 1990  . Drug use: Yes    Types: Marijuana    Comment: 1990    Home Medications Prior to Admission medications   Medication Sig Start Date End Date Taking?  Authorizing Provider  apixaban (ELIQUIS) 5 MG TABS tablet Take 1 tablet (5 mg total) by mouth 2 (two) times daily. 05/05/20   [place], Holli Humbles, NP  bictegravir-emtricitabine-tenofovir AF (BIKTARVY) 50-200-25 MG TABS tablet Take 1 tablet by mouth daily. 06/04/20   Truman Hayward, MD  dexamethasone (DECADRON) 4 MG tablet Take 2 tablets (8 mg total) by mouth daily. Start the day after chemotherapy for 3 days. Take with food. 12/12/19   Volanda Napoleon, MD  hydrocortisone (PROCTOCARE-HC) 2.5 % rectal cream Place 1 application  rectally 2 (two) times daily. 04/21/20   Volanda Napoleon, MD  ondansetron (ZOFRAN) 8 MG tablet Take 1 tablet (8 mg total) by mouth 2 (two) times daily as needed. Start on day 3 after chemotherapy. 12/12/19   Volanda Napoleon, MD  pantoprazole (PROTONIX) 20 MG tablet Take 1 tablet (20 mg total) by mouth daily. 06/10/20   Daleen Bo, MD  potassium chloride SA (KLOR-CON) 20 MEQ tablet Take 2 tablets (40 mEq total) by mouth 2 (two) times daily. 03/18/20   [place], Holli Humbles, NP    Allergies    Patient has no known allergies.  Review of Systems   Review of Systems  All other systems reviewed and are negative.   Physical Exam Updated Vital Signs BP 117/88   Pulse 97   Temp 98.6 F (37 C) (Oral)   Resp 15   Ht 5\' 2"  (1.575 m)   Wt 54.4 kg   SpO2 100%   BMI 21.95 kg/m   Physical Exam Vitals and nursing note reviewed.  Constitutional:      General: She is not in acute distress.    Appearance: She is well-developed. She is not ill-appearing, toxic-appearing or diaphoretic.  HENT:     Head: Normocephalic and atraumatic.     Right Ear: External ear normal.     Left Ear: External ear normal.     Mouth/Throat:     Mouth: Mucous membranes are moist.     Pharynx: No oropharyngeal exudate or posterior oropharyngeal erythema.  Eyes:     Conjunctiva/sclera: Conjunctivae normal.     Pupils: Pupils are equal, round, and reactive to light.  Neck:     Trachea: Phonation normal.  Cardiovascular:     Rate and Rhythm: Normal rate and regular rhythm.     Heart sounds: Normal heart sounds.  Pulmonary:     Effort: Pulmonary effort is normal.     Breath sounds: Normal breath sounds.  Abdominal:     General: There is no distension.     Palpations: Abdomen is soft.     Tenderness: There is no abdominal tenderness. There is no guarding.  Musculoskeletal:        General: Normal range of motion.     Cervical back: Normal range of motion and neck supple.  Skin:    General: Skin is warm and  dry.  Neurological:     Mental Status: She is alert and oriented to person, place, and time.     Cranial Nerves: No cranial nerve deficit.     Sensory: No sensory deficit.     Motor: No abnormal muscle tone.     Coordination: Coordination normal.  Psychiatric:        Mood and Affect: Mood normal.        Behavior: Behavior normal.        Thought Content: Thought content normal.        Judgment: Judgment normal.  ED Results / Procedures / Treatments   Labs (all labs ordered are listed, but only abnormal results are displayed) Labs Reviewed  COMPREHENSIVE METABOLIC PANEL - Abnormal; Notable for the following components:      Result Value   Potassium 3.4 (*)    Calcium 8.3 (*)    Total Protein 5.6 (*)    Albumin 2.4 (*)    All other components within normal limits  CBC - Abnormal; Notable for the following components:   WBC 10.9 (*)    RBC 3.61 (*)    MCV 104.7 (*)    All other components within normal limits  LIPASE, BLOOD  URINALYSIS, ROUTINE W REFLEX MICROSCOPIC    EKG None  Radiology No results found.  Procedures Procedures (including critical care time)  Medications Ordered in ED Medications  sodium chloride flush (NS) 0.9 % injection 10-40 mL (has no administration in time range)  sodium chloride flush (NS) 0.9 % injection 10-40 mL (has no administration in time range)  Chlorhexidine Gluconate Cloth 2 % PADS 6 each (has no administration in time range)  sodium chloride 0.9 % bolus 500 mL (0 mLs Intravenous Stopped 06/10/20 2125)  pantoprazole (PROTONIX) injection 40 mg (40 mg Intravenous Given 06/10/20 2025)  sodium chloride 0.9 % bolus 1,000 mL (1,000 mLs Intravenous New Bag/Given 06/10/20 2125)    ED Course  I have reviewed the triage vital signs and the nursing notes.  Pertinent labs & imaging results that were available during my care of the patient were reviewed by me and considered in my medical decision making (see chart for details).  Clinical  Course as of Jun 10 2248  Tue Jun 10, 2020  2039 Normal except white count high, RBC low, MCV high   [EW]  2040 Normal except potassium low, calcium low, total protein low, albumin low  Comprehensive metabolic panel(!) [EW]    Clinical Course User Index [EW] Daleen Bo, MD   MDM Rules/Calculators/A&P                           Patient Vitals for the past 24 hrs:  BP Temp Temp src Pulse Resp SpO2 Height Weight  06/10/20 2115 117/88 -- -- 97 15 100 % -- --  06/10/20 2100 117/88 -- -- 96 18 100 % -- --  06/10/20 1945 (!) 120/95 -- -- (!) 102 (!) 21 99 % -- --  06/10/20 1915 110/84 -- -- 99 19 100 % -- --  06/10/20 1900 97/78 -- -- 98 18 100 % -- --  06/10/20 1815 112/89 -- -- (!) 107 16 100 % -- --  06/10/20 1800 (!) 119/92 -- -- (!) 101 17 100 % -- --  06/10/20 1745 (!) 117/93 -- -- 98 18 100 % -- --  06/10/20 1730 112/90 -- -- 100 14 100 % -- --  06/10/20 1715 (!) 121/92 -- -- 97 16 100 % -- --  06/10/20 1508 114/86 98.6 F (37 C) Oral 96 18 96 % 5\' 2"  (1.575 m) 54.4 kg    10:45 PM- reevaluation with update and discussion. After initial assessment and treatment, an updated evaluation reveals she is more comfortable now and states she is ready to go home.Daleen Bo   Medical Decision Making:  This patient is presenting for evaluation of dizziness, which does require a range of treatment options, and is a complaint that involves a moderate risk of morbidity and mortality. The differential diagnoses include dehydration,  metabolic disorder, complication of the cancer. I decided to review old records, and in summary middle-aged female currently being treated for colon cancer which is metastatic and also recently diagnosed with HIV disease and started on medication for that.  Is presenting for lightheaded feeling that started today.  She has not been vomiting.  I did not require additional historical information from anyone.  Clinical Laboratory Tests Ordered, included lipase,  CBC, Metabolic panel and Urinalysis. Review indicates normal except potassium low, calcium low, total protein low, albumin low, white count high, MCV high, lipase normal.     Critical Interventions-clinical evaluation, laboratory testing, orthostatic vital signs, IV fluids, Protonix, observation and reassessment  After These Interventions, the Patient was reevaluated and was found stable for discharge.  Decreased appetite, likely multifactorial with possible gastritis/esophagitis.  Patient with mild dehydration, improved after IV fluids given.  She is stable for discharge.  CRITICAL CARE-no Performed by: Daleen Bo  Nursing Notes Reviewed/ Care Coordinated Applicable Imaging Reviewed Interpretation of Laboratory Data incorporated into ED treatment  The patient appears reasonably screened and/or stabilized for discharge and I doubt any other medical condition or other [facility] requiring further screening, evaluation, or treatment in the ED at this time prior to discharge.  Plan: Home Medications-continue current medications gradual advance diet; Home Treatments-gradual advance diet; return here if the recommended treatment, does not improve the symptoms; Recommended follow up-oncology follow-up as scheduled.     Final Clinical Impression(s) / ED Diagnoses Final diagnoses:  Dehydration  Gastritis without bleeding, unspecified chronicity, unspecified gastritis type    Rx / DC Orders ED Discharge Orders         Ordered    pantoprazole (PROTONIX) 20 MG tablet  Daily        06/10/20 2249           Daleen Bo, MD 06/10/20 2304

## 2020-06-11 ENCOUNTER — Ambulatory Visit: Payer: Medicaid Other | Admitting: Family

## 2020-06-11 ENCOUNTER — Other Ambulatory Visit: Payer: Medicaid Other

## 2020-06-11 ENCOUNTER — Inpatient Hospital Stay: Payer: Medicaid Other

## 2020-06-11 ENCOUNTER — Other Ambulatory Visit: Payer: Self-pay | Admitting: Pharmacist

## 2020-06-13 ENCOUNTER — Inpatient Hospital Stay: Payer: Medicaid Other

## 2020-06-16 ENCOUNTER — Telehealth: Payer: Self-pay | Admitting: *Deleted

## 2020-06-16 ENCOUNTER — Telehealth: Payer: Self-pay

## 2020-06-16 NOTE — Telephone Encounter (Signed)
Called and left a message with r/s missed appts- Hosp f/u for 06/23/20 per inbasket message.... AOM

## 2020-06-16 NOTE — Telephone Encounter (Signed)
Call received from patient's mother to confirm appt times scheduled for Monday, 06/23/20.  Scheduling notified.

## 2020-06-23 ENCOUNTER — Encounter: Payer: Self-pay | Admitting: Hematology & Oncology

## 2020-06-23 ENCOUNTER — Inpatient Hospital Stay: Payer: Medicaid Other

## 2020-06-23 ENCOUNTER — Inpatient Hospital Stay (HOSPITAL_BASED_OUTPATIENT_CLINIC_OR_DEPARTMENT_OTHER): Payer: Medicaid Other | Admitting: Hematology & Oncology

## 2020-06-23 ENCOUNTER — Telehealth: Payer: Self-pay

## 2020-06-23 ENCOUNTER — Other Ambulatory Visit: Payer: Self-pay

## 2020-06-23 VITALS — BP 99/87 | HR 100 | Temp 97.7°F | Resp 17 | Wt 113.0 lb

## 2020-06-23 DIAGNOSIS — Z21 Asymptomatic human immunodeficiency virus [HIV] infection status: Secondary | ICD-10-CM | POA: Diagnosis not present

## 2020-06-23 DIAGNOSIS — D122 Benign neoplasm of ascending colon: Secondary | ICD-10-CM

## 2020-06-23 DIAGNOSIS — I2699 Other pulmonary embolism without acute cor pulmonale: Secondary | ICD-10-CM | POA: Diagnosis not present

## 2020-06-23 DIAGNOSIS — Z5189 Encounter for other specified aftercare: Secondary | ICD-10-CM | POA: Diagnosis not present

## 2020-06-23 DIAGNOSIS — D500 Iron deficiency anemia secondary to blood loss (chronic): Secondary | ICD-10-CM | POA: Diagnosis not present

## 2020-06-23 DIAGNOSIS — K922 Gastrointestinal hemorrhage, unspecified: Secondary | ICD-10-CM | POA: Diagnosis not present

## 2020-06-23 DIAGNOSIS — J189 Pneumonia, unspecified organism: Secondary | ICD-10-CM

## 2020-06-23 DIAGNOSIS — C180 Malignant neoplasm of cecum: Secondary | ICD-10-CM | POA: Diagnosis present

## 2020-06-23 DIAGNOSIS — I82462 Acute embolism and thrombosis of left calf muscular vein: Secondary | ICD-10-CM | POA: Diagnosis not present

## 2020-06-23 DIAGNOSIS — C787 Secondary malignant neoplasm of liver and intrahepatic bile duct: Secondary | ICD-10-CM

## 2020-06-23 DIAGNOSIS — Z79899 Other long term (current) drug therapy: Secondary | ICD-10-CM | POA: Diagnosis not present

## 2020-06-23 DIAGNOSIS — C770 Secondary and unspecified malignant neoplasm of lymph nodes of head, face and neck: Secondary | ICD-10-CM

## 2020-06-23 DIAGNOSIS — B20 Human immunodeficiency virus [HIV] disease: Secondary | ICD-10-CM

## 2020-06-23 DIAGNOSIS — D490 Neoplasm of unspecified behavior of digestive system: Secondary | ICD-10-CM

## 2020-06-23 DIAGNOSIS — Z5111 Encounter for antineoplastic chemotherapy: Secondary | ICD-10-CM | POA: Diagnosis not present

## 2020-06-23 DIAGNOSIS — R634 Abnormal weight loss: Secondary | ICD-10-CM | POA: Diagnosis not present

## 2020-06-23 LAB — CBC WITH DIFFERENTIAL (CANCER CENTER ONLY)
Abs Immature Granulocytes: 0.03 10*3/uL (ref 0.00–0.07)
Basophils Absolute: 0 10*3/uL (ref 0.0–0.1)
Basophils Relative: 0 %
Eosinophils Absolute: 0.1 10*3/uL (ref 0.0–0.5)
Eosinophils Relative: 1 %
HCT: 36.8 % (ref 36.0–46.0)
Hemoglobin: 12.4 g/dL (ref 12.0–15.0)
Immature Granulocytes: 0 %
Lymphocytes Relative: 30 %
Lymphs Abs: 2.7 10*3/uL (ref 0.7–4.0)
MCH: 33.4 pg (ref 26.0–34.0)
MCHC: 33.7 g/dL (ref 30.0–36.0)
MCV: 99.2 fL (ref 80.0–100.0)
Monocytes Absolute: 0.5 10*3/uL (ref 0.1–1.0)
Monocytes Relative: 6 %
Neutro Abs: 5.5 10*3/uL (ref 1.7–7.7)
Neutrophils Relative %: 63 %
Platelet Count: 418 10*3/uL — ABNORMAL HIGH (ref 150–400)
RBC: 3.71 MIL/uL — ABNORMAL LOW (ref 3.87–5.11)
RDW: 12.5 % (ref 11.5–15.5)
WBC Count: 8.9 10*3/uL (ref 4.0–10.5)
nRBC: 0 % (ref 0.0–0.2)

## 2020-06-23 LAB — IRON AND TIBC
Iron: 54 ug/dL (ref 41–142)
Saturation Ratios: 44 % (ref 21–57)
TIBC: 124 ug/dL — ABNORMAL LOW (ref 236–444)
UIBC: 69 ug/dL — ABNORMAL LOW (ref 120–384)

## 2020-06-23 LAB — CMP (CANCER CENTER ONLY)
ALT: 7 U/L (ref 0–44)
AST: 21 U/L (ref 15–41)
Albumin: 2.9 g/dL — ABNORMAL LOW (ref 3.5–5.0)
Alkaline Phosphatase: 168 U/L — ABNORMAL HIGH (ref 38–126)
Anion gap: 9 (ref 5–15)
BUN: 5 mg/dL — ABNORMAL LOW (ref 6–20)
CO2: 30 mmol/L (ref 22–32)
Calcium: 8.8 mg/dL — ABNORMAL LOW (ref 8.9–10.3)
Chloride: 97 mmol/L — ABNORMAL LOW (ref 98–111)
Creatinine: 0.55 mg/dL (ref 0.44–1.00)
GFR, Estimated: >60 mL/min (ref >60)
Glucose, Bld: 100 mg/dL — ABNORMAL HIGH (ref 70–99)
Potassium: 2.8 mmol/L — ABNORMAL LOW (ref 3.5–5.1)
Sodium: 136 mmol/L (ref 135–145)
Total Bilirubin: 0.6 mg/dL (ref 0.3–1.2)
Total Protein: 6.5 g/dL (ref 6.5–8.1)

## 2020-06-23 LAB — PROCALCITONIN: Procalcitonin: 0.11 ng/mL

## 2020-06-23 LAB — LACTATE DEHYDROGENASE: LDH: 240 U/L — ABNORMAL HIGH (ref 98–192)

## 2020-06-23 LAB — FERRITIN: Ferritin: 1640 ng/mL — ABNORMAL HIGH (ref 11–307)

## 2020-06-23 MED ORDER — PALONOSETRON HCL INJECTION 0.25 MG/5ML
0.2500 mg | Freq: Once | INTRAVENOUS | Status: AC
  Administered 2020-06-23: 0.25 mg via INTRAVENOUS

## 2020-06-23 MED ORDER — OXALIPLATIN CHEMO INJECTION 100 MG/20ML
115.0000 mg | Freq: Once | INTRAVENOUS | Status: AC
  Administered 2020-06-23: 115 mg via INTRAVENOUS
  Filled 2020-06-23: qty 20

## 2020-06-23 MED ORDER — POTASSIUM CHLORIDE CRYS ER 20 MEQ PO TBCR
40.0000 meq | EXTENDED_RELEASE_TABLET | Freq: Two times a day (BID) | ORAL | Status: AC
  Administered 2020-06-23 (×2): 40 meq via ORAL

## 2020-06-23 MED ORDER — SODIUM CHLORIDE 0.9% FLUSH
10.0000 mL | INTRAVENOUS | Status: DC | PRN
  Filled 2020-06-23: qty 10

## 2020-06-23 MED ORDER — DEXTROSE 5 % IV SOLN
Freq: Once | INTRAVENOUS | Status: AC
  Filled 2020-06-23: qty 250

## 2020-06-23 MED ORDER — SODIUM CHLORIDE 0.9 % IV SOLN
150.0000 mg | Freq: Once | INTRAVENOUS | Status: AC
  Administered 2020-06-23: 150 mg via INTRAVENOUS
  Filled 2020-06-23: qty 150

## 2020-06-23 MED ORDER — SODIUM CHLORIDE 0.9 % IV SOLN
10.0000 mg | Freq: Once | INTRAVENOUS | Status: AC
  Administered 2020-06-23: 10 mg via INTRAVENOUS
  Filled 2020-06-23: qty 10

## 2020-06-23 MED ORDER — HEPARIN SOD (PORK) LOCK FLUSH 100 UNIT/ML IV SOLN
500.0000 [IU] | Freq: Once | INTRAVENOUS | Status: DC | PRN
  Filled 2020-06-23: qty 5

## 2020-06-23 MED ORDER — SODIUM CHLORIDE 0.9 % IV SOLN
220.0000 mg | Freq: Once | INTRAVENOUS | Status: AC
  Administered 2020-06-23: 220 mg via INTRAVENOUS
  Filled 2020-06-23: qty 6

## 2020-06-23 MED ORDER — PALONOSETRON HCL INJECTION 0.25 MG/5ML
INTRAVENOUS | Status: AC
  Filled 2020-06-23: qty 5

## 2020-06-23 MED ORDER — LEUCOVORIN CALCIUM INJECTION 350 MG
320.0000 mg | Freq: Once | INTRAVENOUS | Status: AC
  Administered 2020-06-23: 320 mg via INTRAVENOUS
  Filled 2020-06-23: qty 16

## 2020-06-23 MED ORDER — SODIUM CHLORIDE 0.9 % IV SOLN
4600.0000 mg | INTRAVENOUS | Status: DC
  Administered 2020-06-23: 4600 mg via INTRAVENOUS
  Filled 2020-06-23: qty 92

## 2020-06-23 MED ORDER — ATROPINE SULFATE 1 MG/ML IJ SOLN
0.5000 mg | Freq: Once | INTRAMUSCULAR | Status: AC | PRN
  Administered 2020-06-23: 0.5 mg via INTRAVENOUS

## 2020-06-23 MED ORDER — ATROPINE SULFATE 1 MG/ML IJ SOLN
INTRAMUSCULAR | Status: AC
  Filled 2020-06-23: qty 1

## 2020-06-23 NOTE — Telephone Encounter (Signed)
No 06/23/20 los, but appts made from [facility] appt desk and will get AVS -appts in tx room today... AOM

## 2020-06-23 NOTE — Patient Instructions (Signed)

## 2020-06-23 NOTE — Progress Notes (Signed)
Pt discharged in no apparent distress. Pt left ambulatory without assistance. Pt aware of discharge instructions and verbalized understanding and had no further questions.  

## 2020-06-23 NOTE — Progress Notes (Signed)
Hematology and Oncology Follow Up Visit  Anita Hoffman [id number] 12/19/1960 59 y.o. 06/23/2020   Principle Diagnosis:  Metastatic adenocarcinoma of the cecum-liver metastasis Pulmonary emboli and LEFT gastrocnemius vein thrombus HIV-asymptomatic Iron deficiency secondary to GI bleeding  Current Therapy: FOLFOXIRI - startedon 12/12/2019, s/p cycle#7 Biktarvy 1 p.o. daily Udenycasq post chemo Eliquis 5 mg PO BID IV ironas indicated   Interim History:  Anita Hoffman is here today for follow-up.  Overall, she seems to be doing pretty well.  She has been off treatment now for 2 months.  Her CEA had kept coming down until October when it was stable at 72.  Her last CT scans done in October showed that she was still responding to treatment.  She is eating okay.  She has had no problems with bleeding.  She is on Eliquis.  She seems to be doing pretty well with the Eliquis.  She has had no problems with pain.  There is no change in bowel or bladder habits.  She has had no leg swelling.  She has had no cough or shortness of breath.  Her potassium has been on the low side.  She says she is taking potassium at home but not every day.  I told her that she must take potassium daily.  She has had no problems with headache.  Overall, I would say performance status is ECOG 1-2.    Medications:  Allergies as of 06/23/2020   No Known Allergies     Medication List       Accurate as of June 23, 2020 11:35 AM. If you have any questions, ask your nurse or doctor.        STOP taking these medications   hydrocortisone 2.5 % rectal cream Commonly known as: ProctoCare-HC Stopped by: Volanda Napoleon, MD     TAKE these medications   apixaban 5 MG Tabs tablet Commonly known as: ELIQUIS Take 1 tablet (5 mg total) by mouth 2 (two) times daily.   Biktarvy 50-200-25 MG Tabs tablet Generic drug: bictegravir-emtricitabine-tenofovir AF Take 1 tablet by mouth daily.    dexamethasone 4 MG tablet Commonly known as: DECADRON Take 2 tablets (8 mg total) by mouth daily. Start the day after chemotherapy for 3 days. Take with food.   ondansetron 8 MG tablet Commonly known as: Zofran Take 1 tablet (8 mg total) by mouth 2 (two) times daily as needed. Start on day 3 after chemotherapy.   pantoprazole 20 MG tablet Commonly known as: PROTONIX Take 1 tablet (20 mg total) by mouth daily.   potassium chloride SA 20 MEQ tablet Commonly known as: KLOR-CON Take 2 tablets (40 mEq total) by mouth 2 (two) times daily.       Allergies: No Known Allergies  Past Medical History, Surgical history, Social history, and Family History were reviewed and updated.  Review of Systems: Review of Systems  Constitutional: Positive for weight loss.  HENT: Negative.   Eyes: Negative.   Respiratory: Negative.   Cardiovascular: Negative.   Gastrointestinal: Negative.   Genitourinary: Negative.   Musculoskeletal: Negative.   Skin: Negative.   Neurological: Negative.   Endo/Heme/Allergies: Negative.   Psychiatric/Behavioral: Negative.      Physical Exam:  vitals were not taken for this visit.   Wt Readings from Last 3 Encounters:  06/23/20 113 lb (51.3 kg)  06/10/20 120 lb (54.4 kg)  06/04/20 121 lb (54.9 kg)  Vital signs show a temperature of 97.7.  Pulse 120.  Blood pressure 99/87.  Weight is 113 pounds.  Physical Exam Vitals reviewed.  HENT:     Head: Normocephalic and atraumatic.  Eyes:     Pupils: Pupils are equal, round, and reactive to light.  Cardiovascular:     Heart sounds: Normal heart sounds.     Comments: Cardiac exam is tachycardic but regular.  She has no murmurs, rubs or bruits. Pulmonary:     Effort: Pulmonary effort is normal.     Breath sounds: Normal breath sounds.  Abdominal:     General: Bowel sounds are normal.     Palpations: Abdomen is soft.  Musculoskeletal:        General: No tenderness or deformity. Normal range of motion.      Cervical back: Normal range of motion.  Lymphadenopathy:     Cervical: No cervical adenopathy.  Skin:    General: Skin is warm and dry.     Findings: No erythema or rash.  Neurological:     Mental Status: She is alert and oriented to person, place, and time.  Psychiatric:        Behavior: Behavior normal.        Thought Content: Thought content normal.        Judgment: Judgment normal.      Lab Results  Component Value Date   WBC 8.9 06/23/2020   HGB 12.4 06/23/2020   HCT 36.8 06/23/2020   MCV 99.2 06/23/2020   PLT 418 (H) 06/23/2020   Lab Results  Component Value Date   FERRITIN 2,265 (H) 05/19/2020   IRON 143 (H) 05/19/2020   TIBC 138 (L) 05/19/2020   UIBC UNABLE TO CALCULATE 05/19/2020   IRONPCTSAT 104 (H) 05/19/2020   Lab Results  Component Value Date   RBC 3.71 (L) 06/23/2020   No results found for: KPAFRELGTCHN, LAMBDASER, KAPLAMBRATIO No results found for: IGGSERUM, IGA, IGMSERUM No results found for: Odetta Pink, SPEI   Chemistry      Component Value Date/Time   NA 136 06/23/2020 0927   K 2.8 (L) 06/23/2020 0927   CL 97 (L) 06/23/2020 0927   CO2 30 06/23/2020 0927   BUN 5 (L) 06/23/2020 0927   CREATININE 0.55 06/23/2020 0927   CREATININE 0.65 04/09/2020 1015      Component Value Date/Time   CALCIUM 8.8 (L) 06/23/2020 0927   ALKPHOS 168 (H) 06/23/2020 0927   AST 21 06/23/2020 0927   ALT 7 06/23/2020 0927   BILITOT 0.6 06/23/2020 0927       Impression and Plan: AnitaWilliamsis a verypleasant59yoAfrican American with metastatic colon cancer primarily ofthe cecum withextensive liver metastasis.  We will go ahead with her treatment.  This will be her eighth cycle of treatment.  At some point, we will have to repeat another scan on her.  We will have to watch her weight.  Her potassium is low.  She will get 40 mEq of potassium today.  Again I told her to take potassium at home every  day.  We will plan to get her back to see [place] in 3 weeks.   Volanda Napoleon, MD 11/22/202111:35 AM

## 2020-06-23 NOTE — Patient Instructions (Addendum)
[facility] Discharge Instructions for Patients Receiving Chemotherapy  Today you received the following chemotherapy agents Irinotecan, Leucovorin, 5FU, oxaliplatin  To help prevent nausea and vomiting after your treatment, we encourage you to take your nausea medication as prescribed by MD.  **DO NOT TAKE ZOFRAN FOR 3 DAYS AFTER CHEMOTHERAPY**  If you develop nausea and vomiting that is not controlled by your nausea medication, call the clinic.   BELOW ARE SYMPTOMS THAT SHOULD BE REPORTED IMMEDIATELY:  *FEVER GREATER THAN 100.5 F  *CHILLS WITH OR WITHOUT FEVER  NAUSEA AND VOMITING THAT IS NOT CONTROLLED WITH YOUR NAUSEA MEDICATION  *UNUSUAL SHORTNESS OF BREATH  *UNUSUAL BRUISING OR BLEEDING  TENDERNESS IN MOUTH AND THROAT WITH OR WITHOUT PRESENCE OF ULCERS  *URINARY PROBLEMS  *BOWEL PROBLEMS  UNUSUAL RASH Items with * indicate a potential emergency and should be followed up as soon as possible.  Feel free to call the clinic should you have any questions or concerns. The clinic phone number is [phone].  Please show the [facility] at check-in to the Emergency Department and triage nurse.

## 2020-06-25 ENCOUNTER — Inpatient Hospital Stay: Payer: Medicaid Other

## 2020-06-25 ENCOUNTER — Other Ambulatory Visit: Payer: Self-pay

## 2020-06-25 VITALS — BP 98/81 | HR 109 | Temp 98.5°F | Resp 18

## 2020-06-25 DIAGNOSIS — D490 Neoplasm of unspecified behavior of digestive system: Secondary | ICD-10-CM

## 2020-06-25 DIAGNOSIS — C787 Secondary malignant neoplasm of liver and intrahepatic bile duct: Secondary | ICD-10-CM

## 2020-06-25 DIAGNOSIS — D122 Benign neoplasm of ascending colon: Secondary | ICD-10-CM

## 2020-06-25 DIAGNOSIS — Z5111 Encounter for antineoplastic chemotherapy: Secondary | ICD-10-CM | POA: Diagnosis not present

## 2020-06-25 MED ORDER — HEPARIN SOD (PORK) LOCK FLUSH 100 UNIT/ML IV SOLN
500.0000 [IU] | Freq: Once | INTRAVENOUS | Status: AC | PRN
  Administered 2020-06-25: 500 [IU]
  Filled 2020-06-25: qty 5

## 2020-06-25 MED ORDER — PEGFILGRASTIM-CBQV 6 MG/0.6ML ~~LOC~~ SOSY
6.0000 mg | PREFILLED_SYRINGE | Freq: Once | SUBCUTANEOUS | Status: AC
  Administered 2020-06-25: 6 mg via SUBCUTANEOUS

## 2020-06-25 MED ORDER — SODIUM CHLORIDE 0.9% FLUSH
10.0000 mL | INTRAVENOUS | Status: DC | PRN
  Administered 2020-06-25: 10 mL
  Filled 2020-06-25: qty 10

## 2020-06-25 MED ORDER — PEGFILGRASTIM-CBQV 6 MG/0.6ML ~~LOC~~ SOSY
PREFILLED_SYRINGE | SUBCUTANEOUS | Status: AC
  Filled 2020-06-25: qty 0.6

## 2020-06-30 ENCOUNTER — Telehealth: Payer: Self-pay | Admitting: Hematology & Oncology

## 2020-06-30 NOTE — Telephone Encounter (Signed)
Appointments scheduled calendar printed & mailed per 11/29 secure chat/tx date changed per  [facility] appt desk

## 2020-07-05 ENCOUNTER — Encounter (HOSPITAL_COMMUNITY): Payer: Self-pay

## 2020-07-05 ENCOUNTER — Other Ambulatory Visit: Payer: Self-pay

## 2020-07-05 ENCOUNTER — Ambulatory Visit (HOSPITAL_COMMUNITY)
Admission: EM | Admit: 2020-07-05 | Discharge: 2020-07-05 | Disposition: A | Discharge status: Home / Self Care | Payer: Medicaid Other | Attending: Family Medicine | Admitting: Family Medicine

## 2020-07-05 DIAGNOSIS — R000 Tachycardia, unspecified: Secondary | ICD-10-CM

## 2020-07-05 DIAGNOSIS — K148 Other diseases of tongue: Secondary | ICD-10-CM | POA: Diagnosis not present

## 2020-07-05 MED ORDER — MAGIC MOUTHWASH W/LIDOCAINE: 5.0000 mL | Freq: Four times a day (QID) | ORAL | 0 refills | Status: AC | PRN

## 2020-07-05 NOTE — ED Triage Notes (Signed)
Pt reports having 2 bumps in the tongue x 1 week. States bumps are painful and she is notable to eat. Denies difficulty breathing, swallowing.

## 2020-07-05 NOTE — ED Notes (Signed)
HR reported to [employer], she will check again in 5 min.

## 2020-07-06 NOTE — ED Provider Notes (Signed)
[place]    CSN: [id number] Arrival date & time: 07/05/20  1534      History   Chief Complaint Chief Complaint  Patient presents with  . tongue problem    HPI EULLA KOCHANOWSKI is a 59 y.o. female.   Presenting today with 1 week of sores on her tongue that have caused her difficulty eating due to the pain. She denies injury to the tongue, history of the same, fever, chills, drainage from the area, dizziness, CP, SOB, dizziness, syncope. Hx of HIV, metastatic cancer currently chemotherapy, followed closely by Oncology with next appt in 2 days. Has not tried anything OTC for sxs.      Past Medical History:  Diagnosis Date  . Former smoker   . Goals of care, counseling/discussion 12/06/2019  . HIV infection ([place])   . Iron deficiency anemia due to chronic blood loss 12/12/2019  . Medical history non-contributory     Patient Active Problem List   Diagnosis Date Noted  . 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 Rectal bleeding 12/13/2019  . Bright red rectal bleeding 12/13/2019  . Iron deficiency anemia due to chronic blood loss 12/12/2019  . Goals of care, counseling/discussion 12/06/2019  . HIV disease ([place])   . Adenomatous polyp of ascending colon   . Adenomatous polyp of sigmoid colon   . Metastatic malignant neoplasm ([place])   . Bilateral pulmonary embolism ([place]) 11/29/2019  . 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 Liver metastases ([place]) 11/29/2019  . Elevated troponin 11/29/2019  . ANXIETY, SITUATIONAL 10/13/2009  . PALPITATIONS, RECURRENT 10/03/2009  . TOBACCO USE, QUIT 09/29/2006    Past Surgical History:  Procedure Laterality Date  . BIOPSY  12/01/2019   Procedure: BIOPSY;  Surgeon: Lavena Bullion, DO;  Location: [facility];  Service: Gastroenterology;;  . COLONOSCOPY WITH PROPOFOL N/A 12/01/2019   Procedure: COLONOSCOPY WITH PROPOFOL;  Surgeon: Lavena Bullion, DO;  Location: [facility];  Service: Gastroenterology;  Laterality: N/A;  .  HEMOSTASIS CLIP PLACEMENT  12/01/2019   Procedure: HEMOSTASIS CLIP PLACEMENT;  Surgeon: Lavena Bullion, DO;  Location: [facility];  Service: Gastroenterology;;  . IR IMAGING GUIDED PORT INSERTION  12/06/2019  . POLYPECTOMY  12/01/2019   Procedure: POLYPECTOMY;  Surgeon: Lavena Bullion, DO;  Location: [facility];  Service: Gastroenterology;;  . SUBMUCOSAL TATTOO INJECTION  12/01/2019   Procedure: SUBMUCOSAL TATTOO INJECTION;  Surgeon: Lavena Bullion, DO;  Location: [facility];  Service: Gastroenterology;;  . TUBAL LIGATION      OB History   No obstetric history on file.      Home Medications    Prior to Admission medications   Medication Sig Start Date End Date Taking? Authorizing Provider  apixaban (ELIQUIS) 5 MG TABS tablet Take 1 tablet (5 mg total) by mouth 2 (two) times daily. 05/05/20   [place], Holli Humbles, NP  bictegravir-emtricitabine-tenofovir AF (BIKTARVY) 50-200-25 MG TABS tablet Take 1 tablet by mouth daily. 06/04/20   Truman Hayward, MD  dexamethasone (DECADRON) 4 MG tablet Take 2 tablets (8 mg total) by mouth daily. Start the day after chemotherapy for 3 days. Take with food. Patient not taking: Reported on 06/23/2020 12/12/19   Volanda Napoleon, MD  magic mouthwash w/lidocaine SOLN Take 5 mLs by mouth 4 (four) times daily as needed for mouth pain. 07/05/20   Volney American, PA-C  ondansetron (ZOFRAN) 8 MG tablet Take 1 tablet (8 mg total) by mouth 2 (two) times daily as needed. Start on  day 3 after chemotherapy. 12/12/19   Volanda Napoleon, MD  pantoprazole (PROTONIX) 20 MG tablet Take 1 tablet (20 mg total) by mouth daily. 06/10/20   Daleen Bo, MD  potassium chloride SA (KLOR-CON) 20 MEQ tablet Take 2 tablets (40 mEq total) by mouth 2 (two) times daily. 03/18/20   [place], Holli Humbles, NP    Family History Family History  Problem Relation Age of Onset  . Ovarian cancer Maternal Grandmother   . Lung cancer Father     Social History Social  History   Tobacco Use  . Smoking status: Former Smoker    Packs/day: 0.50    Types: Cigarettes    Quit date: 11/29/2003    Years since quitting: 16.6  . Smokeless tobacco: Never Used  Vaping Use  . Vaping Use: Never used  Substance Use Topics  . Alcohol use: Not Currently    Comment: 1990  . Drug use: Yes    Types: Marijuana    Comment: 1990     Allergies   Patient has no known allergies.   Review of Systems Review of Systems PER HPI    Physical Exam Triage Vital Signs ED Triage Vitals  Enc Vitals Group     BP 07/05/20 1552 98/67     Pulse Rate 07/05/20 1552 (!) 134     Resp 07/05/20 1552 19     Temp 07/05/20 1552 97.9 F (36.6 C)     Temp Source 07/05/20 1552 Oral     SpO2 07/05/20 1552 100 %     Weight --      Height --      Head Circumference --      Peak Flow --      Pain Score 07/05/20 1551 10     Pain Loc --      Pain Edu? --      Excl. in [place]? --    No data found.  Updated Vital Signs BP 96/66 (BP Location: Left Arm)   Pulse (!) 127   Temp 97.9 F (36.6 C) (Oral)   Resp 19   SpO2 100%   Visual Acuity Right Eye Distance:   Left Eye Distance:   Bilateral Distance:    Right Eye Near:   Left Eye Near:    Bilateral Near:     Physical Exam Vitals and nursing note reviewed.  Constitutional:      Appearance: She is not ill-appearing.     Comments: Mildly underweight  HENT:     Head: Atraumatic.  Eyes:     Extraocular Movements: Extraocular movements intact.     Conjunctiva/sclera: Conjunctivae normal.  Cardiovascular:     Rate and Rhythm: Regular rhythm. Tachycardia present.     Heart sounds: Normal heart sounds.  Pulmonary:     Effort: Pulmonary effort is normal.     Breath sounds: Normal breath sounds.  Musculoskeletal:        General: Normal range of motion.     Cervical back: Normal range of motion and neck supple.  Skin:    General: Skin is warm and dry.     Comments: Multiple small papules on tip of tongue and right side of  tongue, no active drainage from areas or discoloration  Neurological:     Mental Status: She is alert and oriented to person, place, and time.     Motor: No weakness.     Gait: Gait normal.  Psychiatric:        Mood and Affect:  Mood normal.        Thought Content: Thought content normal.        Judgment: Judgment normal.      UC Treatments / Results  Labs (all labs ordered are listed, but only abnormal results are displayed) Labs Reviewed - No data to display  EKG   Radiology No results found.  Procedures Procedures (including critical care time)  Medications Ordered in UC Medications - No data to display  Initial Impression / Assessment and Plan / UC Course  I have reviewed the triage vital signs and the nursing notes.  Pertinent labs & imaging results that were available during my care of the patient were reviewed by me and considered in my medical decision making (see chart for details).     EKG performed due to asymptomatic tachycardia, showing sinus tachycardia without other significant changes from prior EKG and no acute ST or T wave changes. Per record review the past month or so she has had HRs around 110 - 120 range. She is overall well appearing today and denies dizziness, weakness, CP, SOB, palpitations and has been tolerating fluids well. Is not interested in going to ED at this time to further work this up. Discussed strict ER precautions if beginning to have any of these sxs, and will treat tongue pain and lesions with magic mouthwash, pain control. Increase PO intake and f/u with Oncology Monday as scheduled.  Final Clinical Impressions(s) / UC Diagnoses   Final diagnoses:  Tongue lesion   Discharge Instructions   None    ED Prescriptions    Medication Sig Dispense Auth. Provider   magic mouthwash w/lidocaine SOLN Take 5 mLs by mouth 4 (four) times daily as needed for mouth pain. 100 mL Volney American, [place]     PDMP not reviewed this  encounter.   Merrie Roof [place], [place] 07/06/20 [phone]

## 2020-07-07 ENCOUNTER — Inpatient Hospital Stay: Payer: Medicaid Other

## 2020-07-07 ENCOUNTER — Inpatient Hospital Stay: Payer: Medicaid Other | Admitting: Hematology & Oncology

## 2020-07-09 ENCOUNTER — Inpatient Hospital Stay: Payer: Medicaid Other

## 2020-07-10 ENCOUNTER — Telehealth: Payer: Self-pay

## 2020-07-10 NOTE — Telephone Encounter (Signed)
Pt called and left a v, to confirm 07/14/20 appts, called her back and left a vm ....Marland KitchenAOM

## 2020-07-14 ENCOUNTER — Inpatient Hospital Stay: Payer: Medicaid Other | Attending: Hematology & Oncology

## 2020-07-14 ENCOUNTER — Inpatient Hospital Stay: Payer: Medicaid Other

## 2020-07-14 ENCOUNTER — Other Ambulatory Visit: Payer: Self-pay | Admitting: Family

## 2020-07-14 ENCOUNTER — Other Ambulatory Visit: Payer: Self-pay

## 2020-07-14 ENCOUNTER — Encounter: Payer: Self-pay | Admitting: Family

## 2020-07-14 ENCOUNTER — Telehealth: Payer: Self-pay | Admitting: Hematology & Oncology

## 2020-07-14 ENCOUNTER — Inpatient Hospital Stay (HOSPITAL_BASED_OUTPATIENT_CLINIC_OR_DEPARTMENT_OTHER): Payer: Medicaid Other | Admitting: Family

## 2020-07-14 VITALS — BP 89/65 | HR 95 | Temp 98.7°F | Resp 17 | Ht 62.0 in | Wt 106.1 lb

## 2020-07-14 VITALS — BP 94/64 | HR 72 | Temp 97.7°F

## 2020-07-14 DIAGNOSIS — R5383 Other fatigue: Secondary | ICD-10-CM | POA: Insufficient documentation

## 2020-07-14 DIAGNOSIS — G629 Polyneuropathy, unspecified: Secondary | ICD-10-CM | POA: Insufficient documentation

## 2020-07-14 DIAGNOSIS — G6289 Other specified polyneuropathies: Secondary | ICD-10-CM

## 2020-07-14 DIAGNOSIS — K922 Gastrointestinal hemorrhage, unspecified: Secondary | ICD-10-CM | POA: Diagnosis not present

## 2020-07-14 DIAGNOSIS — I2699 Other pulmonary embolism without acute cor pulmonale: Secondary | ICD-10-CM | POA: Insufficient documentation

## 2020-07-14 DIAGNOSIS — C180 Malignant neoplasm of cecum: Secondary | ICD-10-CM

## 2020-07-14 DIAGNOSIS — Z7901 Long term (current) use of anticoagulants: Secondary | ICD-10-CM | POA: Diagnosis not present

## 2020-07-14 DIAGNOSIS — C787 Secondary malignant neoplasm of liver and intrahepatic bile duct: Secondary | ICD-10-CM

## 2020-07-14 DIAGNOSIS — C770 Secondary and unspecified malignant neoplasm of lymph nodes of head, face and neck: Secondary | ICD-10-CM

## 2020-07-14 DIAGNOSIS — D500 Iron deficiency anemia secondary to blood loss (chronic): Secondary | ICD-10-CM | POA: Diagnosis not present

## 2020-07-14 DIAGNOSIS — I82462 Acute embolism and thrombosis of left calf muscular vein: Secondary | ICD-10-CM | POA: Insufficient documentation

## 2020-07-14 DIAGNOSIS — Z21 Asymptomatic human immunodeficiency virus [HIV] infection status: Secondary | ICD-10-CM | POA: Insufficient documentation

## 2020-07-14 DIAGNOSIS — Z79899 Other long term (current) drug therapy: Secondary | ICD-10-CM | POA: Diagnosis not present

## 2020-07-14 LAB — CMP (CANCER CENTER ONLY)
ALT: 11 U/L (ref 0–44)
AST: 26 U/L (ref 15–41)
Albumin: 3 g/dL — ABNORMAL LOW (ref 3.5–5.0)
Alkaline Phosphatase: 153 U/L — ABNORMAL HIGH (ref 38–126)
Anion gap: 11 (ref 5–15)
BUN: 7 mg/dL (ref 6–20)
CO2: 30 mmol/L (ref 22–32)
Calcium: 7.4 mg/dL — ABNORMAL LOW (ref 8.9–10.3)
Chloride: 98 mmol/L (ref 98–111)
Creatinine: 0.64 mg/dL (ref 0.44–1.00)
GFR, Estimated: >60 mL/min (ref >60)
Glucose, Bld: 96 mg/dL (ref 70–99)
Potassium: 2.5 mmol/L — CL (ref 3.5–5.1)
Sodium: 139 mmol/L (ref 135–145)
Total Bilirubin: 0.4 mg/dL (ref 0.3–1.2)
Total Protein: 5.6 g/dL — ABNORMAL LOW (ref 6.5–8.1)

## 2020-07-14 LAB — CBC WITH DIFFERENTIAL (CANCER CENTER ONLY)
Abs Immature Granulocytes: 0.13 10*3/uL — ABNORMAL HIGH (ref 0.00–0.07)
Basophils Absolute: 0.1 10*3/uL (ref 0.0–0.1)
Basophils Relative: 0 %
Eosinophils Absolute: 0 10*3/uL (ref 0.0–0.5)
Eosinophils Relative: 0 %
HCT: 31.9 % — ABNORMAL LOW (ref 36.0–46.0)
Hemoglobin: 10.9 g/dL — ABNORMAL LOW (ref 12.0–15.0)
Immature Granulocytes: 1 %
Lymphocytes Relative: 27 %
Lymphs Abs: 3.9 10*3/uL (ref 0.7–4.0)
MCH: 33.3 pg (ref 26.0–34.0)
MCHC: 34.2 g/dL (ref 30.0–36.0)
MCV: 97.6 fL (ref 80.0–100.0)
Monocytes Absolute: 1.3 10*3/uL — ABNORMAL HIGH (ref 0.1–1.0)
Monocytes Relative: 9 %
Neutro Abs: 8.9 10*3/uL — ABNORMAL HIGH (ref 1.7–7.7)
Neutrophils Relative %: 63 %
Platelet Count: 709 10*3/uL — ABNORMAL HIGH (ref 150–400)
RBC: 3.27 MIL/uL — ABNORMAL LOW (ref 3.87–5.11)
RDW: 13 % (ref 11.5–15.5)
WBC Count: 14.3 10*3/uL — ABNORMAL HIGH (ref 4.0–10.5)
nRBC: 0 % (ref 0.0–0.2)

## 2020-07-14 LAB — CEA (IN HOUSE-CHCC): CEA (CHCC-In House): 477.58 ng/mL — ABNORMAL HIGH (ref 0.00–5.00)

## 2020-07-14 LAB — IRON AND TIBC
Iron: 48 ug/dL (ref 41–142)
Saturation Ratios: 33 % (ref 21–57)
TIBC: 144 ug/dL — ABNORMAL LOW (ref 236–444)
UIBC: 96 ug/dL — ABNORMAL LOW (ref 120–384)

## 2020-07-14 LAB — FERRITIN: Ferritin: 2222 ng/mL — ABNORMAL HIGH (ref 11–307)

## 2020-07-14 MED ORDER — SODIUM CHLORIDE 0.9 % IV SOLN
Freq: Once | INTRAVENOUS | Status: AC
  Filled 2020-07-14: qty 250

## 2020-07-14 MED ORDER — GABAPENTIN 300 MG PO CAPS: 300.0000 mg | ORAL_CAPSULE | Freq: Every day | ORAL | 1 refills | Status: DC

## 2020-07-14 NOTE — Progress Notes (Signed)
Hematology and Oncology Follow Up Visit  Anita Hoffman [id number] [birth year] 59 y.o. 07/14/2020   Principle Diagnosis:  Metastatic adenocarcinoma of the cecum-liver metastasis Pulmonary emboli and LEFT gastrocnemius vein thrombus HIV-asymptomatic Iron deficiency secondary to GI bleeding  Current Therapy: FOLFOXIRI - startedon 12/12/2019, s/p cycle8 Biktarvy 1 p.o. daily Udenycasq post chemo Eliquis 5 mg PO BID IV ironas indicated   Interim History:  Anita Hoffman is here today for follow-up and treatment. She is feeling fatigued and has noted SOB with over exertion.  Hgb is down to 10.9, MCV 97, platelets 709 and WBC count 14.3.  Potassium is 2.5. She states that she has not been taking her KDUR. She verbalized that she will restart taking BID.  She has not noted any obvious blood loss. No abnormal bruising, no petechiae.  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 This is keeping her from being able to sleep at nights.  She denies falls or syncope. No changes in her gait or dexterity so far.  CEA in October was stable at 59. Today's result is pending.  No fever, chills, n/v, cough, rash, dizziness, chest pain, palpitations, abdominal pain or changes in bowel or bladder habits.  She states that the magic mouth wash has really helped with the thrush. No swelling in her extremities at this time. Pedal pulses are 2+.  She states that her appetite is around 50% but she is doing her best to stay well hydrated. Her weight is down 7 lbs since her last visit to 106 lbs.   ECOG Performance Status: 1 - Symptomatic but completely ambulatory  Medications:  Allergies as of 07/14/2020   No Known Allergies     Medication List       Accurate as of July 14, 2020 10:17 AM. If you have any questions, ask your nurse or doctor.        apixaban 5 MG Tabs tablet Commonly known as: ELIQUIS Take 1 tablet (5 mg total) by mouth 2 (two) times daily.   Biktarvy  50-200-25 MG Tabs tablet Generic drug: bictegravir-emtricitabine-tenofovir AF Take 1 tablet by mouth daily.   dexamethasone 4 MG tablet Commonly known as: DECADRON Take 2 tablets (8 mg total) by mouth daily. Start the day after chemotherapy for 3 days. Take with food.   magic mouthwash w/lidocaine Soln Take 5 mLs by mouth 4 (four) times daily as needed for mouth pain.   ondansetron 8 MG tablet Commonly known as: Zofran Take 1 tablet (8 mg total) by mouth 2 (two) times daily as needed. Start on day 3 after chemotherapy.   pantoprazole 20 MG tablet Commonly known as: PROTONIX Take 1 tablet (20 mg total) by mouth daily.   potassium chloride SA 20 MEQ tablet Commonly known as: KLOR-CON Take 2 tablets (40 mEq total) by mouth 2 (two) times daily.       Allergies: No Known Allergies  Past Medical History, Surgical history, Social history, and Family History were reviewed and updated.  Review of Systems: All other 10 point review of systems is negative.   Physical Exam:  vitals were not taken for this visit.   Wt Readings from Last 3 Encounters:  06/23/20 113 lb (51.3 kg)  06/10/20 120 lb (54.4 kg)  06/04/20 121 lb (54.9 kg)    Ocular: Sclerae unicteric, pupils equal, round and reactive to light Ear-nose-throat: Oropharynx clear, dentition fair Lymphatic: No cervical or supraclavicular adenopathy Lungs no rales or rhonchi, good excursion bilaterally Heart regular rate  and rhythm, no murmur appreciated Abd soft, nontender, positive bowel sounds MSK no focal spinal tenderness, no joint edema Neuro: non-focal, well-oriented, appropriate affect Breasts: Deferred   Lab Results  Component Value Date   WBC 8.9 06/23/2020   HGB 12.4 06/23/2020   HCT 36.8 06/23/2020   MCV 99.2 06/23/2020   PLT 418 (H) 06/23/2020   Lab Results  Component Value Date   FERRITIN 1,640 (H) 06/23/2020   IRON 54 06/23/2020   TIBC 124 (L) 06/23/2020   UIBC 69 (L) 06/23/2020   IRONPCTSAT 44  06/23/2020   Lab Results  Component Value Date   RBC 3.71 (L) 06/23/2020   No results found for: KPAFRELGTCHN, LAMBDASER, KAPLAMBRATIO No results found for: IGGSERUM, IGA, IGMSERUM No results found for: Ronnald Ramp, A1GS, Nelida Meuse, SPEI   Chemistry      Component Value Date/Time   NA 136 06/23/2020 0927   K 2.8 (L) 06/23/2020 0927   CL 97 (L) 06/23/2020 0927   CO2 30 06/23/2020 0927   BUN 5 (L) 06/23/2020 0927   CREATININE 0.55 06/23/2020 0927   CREATININE 0.65 04/09/2020 1015      Component Value Date/Time   CALCIUM 8.8 (L) 06/23/2020 0927   ALKPHOS 168 (H) 06/23/2020 0927   AST 21 06/23/2020 0927   ALT 7 06/23/2020 0927   BILITOT 0.6 06/23/2020 0927       Impression and Plan:  AnitaWilliamsis a verypleasant59yoAfrican American with metastatic colon cancer primarily ofthe cecum withextensive liver metastasis. She is symptomatic with fatigue and SOB with exertion. She has also lost 7 lbs since her last visit.  I spoke with Dr. Marin Olp and we will hold treatment today and just give fluids.  We will see what her iron studies look like and replace if needed.  We will repeat CT scans this week and follow-up in 1 week.  She is in agreement with the plan. She was encouraged to contact our office with any questions or concerns.   Laverna Peace, NP 12/13/202110:17 AM

## 2020-07-14 NOTE — Progress Notes (Signed)
Pt discharged in no apparent distress. Pt left ambulatory without assistance. Pt aware of discharge instructions and verbalized understanding and had no further questions.  

## 2020-07-14 NOTE — Patient Instructions (Signed)

## 2020-07-14 NOTE — Telephone Encounter (Signed)
Appointments scheduled calendar printed per 12/13 los 

## 2020-07-14 NOTE — Patient Instructions (Signed)

## 2020-07-16 ENCOUNTER — Inpatient Hospital Stay: Payer: Medicaid Other

## 2020-07-23 ENCOUNTER — Ambulatory Visit (INDEPENDENT_AMBULATORY_CARE_PROVIDER_SITE_OTHER): Payer: Medicaid Other | Admitting: Infectious Disease

## 2020-07-23 ENCOUNTER — Other Ambulatory Visit: Payer: Self-pay

## 2020-07-23 ENCOUNTER — Encounter: Payer: Self-pay | Admitting: Infectious Disease

## 2020-07-23 VITALS — BP 111/80 | HR 92 | Wt 106.0 lb

## 2020-07-23 DIAGNOSIS — C180 Malignant neoplasm of cecum: Secondary | ICD-10-CM

## 2020-07-23 DIAGNOSIS — B20 Human immunodeficiency virus [HIV] disease: Secondary | ICD-10-CM

## 2020-07-23 DIAGNOSIS — Z23 Encounter for immunization: Secondary | ICD-10-CM | POA: Diagnosis not present

## 2020-07-23 DIAGNOSIS — I2699 Other pulmonary embolism without acute cor pulmonale: Secondary | ICD-10-CM

## 2020-07-23 DIAGNOSIS — C787 Secondary malignant neoplasm of liver and intrahepatic bile duct: Secondary | ICD-10-CM | POA: Diagnosis not present

## 2020-07-23 NOTE — Progress Notes (Signed)
Subjective:  Chief complaint: Follow-up for HIV disease metastatic colon cancer  Patient ID: Anita Hoffman, female    DOB: 21-Mar-1961, 59 y.o.   MRN: [id number]  HPI  Anita Hoffman is a 82y ear old African American woman recently diagnosed with HIV disease (with healthy CD4 count and not that high of a viral load) but also with metastatic colon cancer and pulmonary emboli.  She is seeing Dr. Marin Olp with Oncology and receiving chemotherapy with FOLFOX.  We did same day initiation with Biktarvy in the hospital which she has tolerated without any problems.   Her malignancy is responding to chemotherapy and she is following closely with Dr. Marin Olp.  She is on Eliquis now for anticoagulation and K supplementation  Her viral load is undetectable and her CD4 count was healthy and we checked it last.   She had nice response with CEA coming down. CT abdomen and pelvis was done and this showed reduction in size of hepatic mets.  Her Medicaid was approved!  More recently when she saw oncology her CEA unfortunately had come back up.  Does not appear that she was aware of this.  Plans were made for repeat imaging and she also has follow-up with oncology in early January.       Past Medical History:  Diagnosis Date  . Former smoker   . Goals of care, counseling/discussion 12/06/2019  . HIV infection ([facility])   . Iron deficiency anemia due to chronic blood loss 12/12/2019  . Medical history non-contributory     Past Surgical History:  Procedure Laterality Date  . BIOPSY  12/01/2019   Procedure: BIOPSY;  Surgeon: Lavena Bullion, DO;  Location: [facility];  Service: Gastroenterology;;  . COLONOSCOPY WITH PROPOFOL N/A 12/01/2019   Procedure: COLONOSCOPY WITH PROPOFOL;  Surgeon: Lavena Bullion, DO;  Location: [facility];  Service: Gastroenterology;  Laterality: N/A;  . HEMOSTASIS CLIP PLACEMENT  12/01/2019   Procedure: HEMOSTASIS CLIP PLACEMENT;  Surgeon: Lavena Bullion, DO;   Location: [facility];  Service: Gastroenterology;;  . IR IMAGING GUIDED PORT INSERTION  12/06/2019  . POLYPECTOMY  12/01/2019   Procedure: POLYPECTOMY;  Surgeon: Lavena Bullion, DO;  Location: [facility];  Service: Gastroenterology;;  . SUBMUCOSAL TATTOO INJECTION  12/01/2019   Procedure: SUBMUCOSAL TATTOO INJECTION;  Surgeon: Lavena Bullion, DO;  Location: [facility];  Service: Gastroenterology;;  . TUBAL LIGATION      Family History  Problem Relation Age of Onset  . Ovarian cancer Maternal Grandmother   . Lung cancer Father       Social History   Socioeconomic History  . Marital status: Single    Spouse name: Not on file  . Number of children: 5  . Years of education: Not on file  . Highest education level: Not on file  Occupational History  . Not on file  Tobacco Use  . Smoking status: Former Smoker    Packs/day: 0.50    Types: Cigarettes    Quit date: 11/29/2003    Years since quitting: 16.6  . Smokeless tobacco: Never Used  Vaping Use  . Vaping Use: Never used  Substance and Sexual Activity  . Alcohol use: Not Currently    Comment: 1990  . Drug use: Yes    Types: Marijuana    Comment: 1990  . Sexual activity: Not Currently  Other Topics Concern  . Not on file  Social History Narrative  . Not on file   Social Determinants of Health  Financial Resource Strain: Not on file  Food Insecurity: Not on file  Transportation Needs: Not on file  Physical Activity: Not on file  Stress: Not on file  Social Connections: Not on file    No Known Allergies   Current Outpatient Medications:  .  apixaban (ELIQUIS) 5 MG TABS tablet, Take 1 tablet (5 mg total) by mouth 2 (two) times daily., Disp: 60 tablet, Rfl: 5 .  bictegravir-emtricitabine-tenofovir AF (BIKTARVY) 50-200-25 MG TABS tablet, Take 1 tablet by mouth daily., Disp: 30 tablet, Rfl: 11 .  dexamethasone (DECADRON) 4 MG tablet, Take 2 tablets (8 mg total) by mouth daily. Start the day after chemotherapy  for 3 days. Take with food., Disp: 8 tablet, Rfl: 5 .  gabapentin (NEURONTIN) 300 MG capsule, Take 1 capsule (300 mg total) by mouth at bedtime., Disp: 30 capsule, Rfl: 1 .  ondansetron (ZOFRAN) 8 MG tablet, Take 1 tablet (8 mg total) by mouth 2 (two) times daily as needed. Start on day 3 after chemotherapy., Disp: 30 tablet, Rfl: 1 .  potassium chloride SA (KLOR-CON) 20 MEQ tablet, Take 2 tablets (40 mEq total) by mouth 2 (two) times daily., Disp: 120 tablet, Rfl: 1 .  magic mouthwash w/lidocaine SOLN, Take 5 mLs by mouth 4 (four) times daily as needed for mouth pain., Disp: 100 mL, Rfl: 0 .  pantoprazole (PROTONIX) 20 MG tablet, Take 1 tablet (20 mg total) by mouth daily. (Patient not taking: Reported on 07/23/2020), Disp: 30 tablet, Rfl: 1  Review of Systems  Constitutional: Negative for activity change, appetite change, chills, diaphoresis, fatigue and fever.  HENT: Negative for congestion, rhinorrhea, sinus pressure, sneezing, sore throat and trouble swallowing.   Eyes: Negative for photophobia and visual disturbance.  Respiratory: Negative for cough, chest tightness, shortness of breath, wheezing and stridor.   Cardiovascular: Negative for chest pain, palpitations and leg swelling.  Gastrointestinal: Negative for abdominal distention, abdominal pain, anal bleeding, blood in stool, constipation, diarrhea, nausea and vomiting.  Genitourinary: Negative for difficulty urinating, dysuria, flank pain and hematuria.  Musculoskeletal: Negative for arthralgias, back pain, gait problem, joint swelling and myalgias.  Skin: Negative for color change, pallor, rash and wound.  Neurological: Negative for dizziness, tremors, weakness and light-headedness.  Hematological: Negative for adenopathy. Does not bruise/bleed easily.  Psychiatric/Behavioral: Negative for agitation, behavioral problems, confusion, decreased concentration, dysphoric mood, self-injury and sleep disturbance.       Objective:    Physical Exam Constitutional:      General: She is not in acute distress.    Appearance: She is not diaphoretic.  HENT:     Head: Normocephalic and atraumatic.     Right Ear: External ear normal.     Left Ear: External ear normal.     Nose: Nose normal.     Mouth/Throat:     Pharynx: No oropharyngeal exudate.  Eyes:     General: No scleral icterus.    Conjunctiva/sclera: Conjunctivae normal.     Pupils: Pupils are equal, round, and reactive to light.  Cardiovascular:     Rate and Rhythm: Normal rate and regular rhythm.  Pulmonary:     Effort: Pulmonary effort is normal.  Abdominal:     Palpations: Abdomen is soft.     Tenderness: There is abdominal tenderness. There is rebound.  Musculoskeletal:        General: No tenderness. Normal range of motion.     Cervical back: Normal range of motion and neck supple.  Lymphadenopathy:     Cervical:  No cervical adenopathy.  Skin:    General: Skin is warm and dry.     Coloration: Skin is not pale.     Findings: No erythema or rash.  Neurological:     General: No focal deficit present.     Mental Status: She is alert and oriented to person, place, and time. Mental status is at baseline.     Coordination: Coordination normal.  Psychiatric:        Mood and Affect: Mood normal.        Behavior: Behavior normal.        Thought Content: Thought content normal.        Judgment: Judgment normal.            Assessment & Plan:  HIV disease : Labs today continue BIKTARVY of CD4 below 200 we will start Bactrim for PCP prevention  PEs: on anticoagulation   Metastatic colon cancer: CEA antigen is unfortunately up.  Oncology aware and following her she has appointments with them in early January

## 2020-07-23 NOTE — Addendum Note (Signed)
Addended by: Caffie Pinto on: 07/23/2020 11:40 AM   Modules accepted: Orders

## 2020-07-24 LAB — T-HELPER CELL (CD4) - (RCID CLINIC ONLY)
CD4 % Helper T Cell: 36 % (ref 33–65)
CD4 T Cell Abs: 1189 /uL (ref 400–1790)

## 2020-07-31 ENCOUNTER — Ambulatory Visit (HOSPITAL_BASED_OUTPATIENT_CLINIC_OR_DEPARTMENT_OTHER)
Admission: RE | Admit: 2020-07-31 | Discharge: 2020-07-31 | Disposition: A | Discharge status: Home / Self Care | Payer: Medicaid Other | Source: Ambulatory Visit | Attending: Family | Admitting: Family

## 2020-07-31 ENCOUNTER — Other Ambulatory Visit: Payer: Self-pay

## 2020-07-31 ENCOUNTER — Encounter (HOSPITAL_BASED_OUTPATIENT_CLINIC_OR_DEPARTMENT_OTHER): Payer: Self-pay

## 2020-07-31 DIAGNOSIS — C180 Malignant neoplasm of cecum: Secondary | ICD-10-CM | POA: Diagnosis present

## 2020-07-31 MED ORDER — IOHEXOL 300 MG/ML  SOLN
100.0000 mL | Freq: Once | INTRAMUSCULAR | Status: AC | PRN
  Administered 2020-07-31: 15:00:00 100 mL via INTRAVENOUS

## 2020-08-04 ENCOUNTER — Other Ambulatory Visit: Payer: Self-pay | Admitting: *Deleted

## 2020-08-04 ENCOUNTER — Telehealth: Payer: Self-pay | Admitting: *Deleted

## 2020-08-04 ENCOUNTER — Inpatient Hospital Stay: Payer: Medicaid Other | Attending: Hematology & Oncology

## 2020-08-04 ENCOUNTER — Inpatient Hospital Stay: Payer: Medicaid Other

## 2020-08-04 ENCOUNTER — Other Ambulatory Visit: Payer: Self-pay

## 2020-08-04 ENCOUNTER — Inpatient Hospital Stay (HOSPITAL_BASED_OUTPATIENT_CLINIC_OR_DEPARTMENT_OTHER): Payer: Medicaid Other | Admitting: Hematology & Oncology

## 2020-08-04 ENCOUNTER — Encounter: Payer: Self-pay | Admitting: Hematology & Oncology

## 2020-08-04 VITALS — BP 102/76 | HR 84 | Temp 97.9°F | Resp 18

## 2020-08-04 DIAGNOSIS — I82462 Acute embolism and thrombosis of left calf muscular vein: Secondary | ICD-10-CM | POA: Insufficient documentation

## 2020-08-04 DIAGNOSIS — C770 Secondary and unspecified malignant neoplasm of lymph nodes of head, face and neck: Secondary | ICD-10-CM

## 2020-08-04 DIAGNOSIS — Z21 Asymptomatic human immunodeficiency virus [HIV] infection status: Secondary | ICD-10-CM | POA: Diagnosis not present

## 2020-08-04 DIAGNOSIS — D122 Benign neoplasm of ascending colon: Secondary | ICD-10-CM

## 2020-08-04 DIAGNOSIS — R634 Abnormal weight loss: Secondary | ICD-10-CM | POA: Diagnosis not present

## 2020-08-04 DIAGNOSIS — D500 Iron deficiency anemia secondary to blood loss (chronic): Secondary | ICD-10-CM | POA: Insufficient documentation

## 2020-08-04 DIAGNOSIS — I2699 Other pulmonary embolism without acute cor pulmonale: Secondary | ICD-10-CM | POA: Diagnosis not present

## 2020-08-04 DIAGNOSIS — K922 Gastrointestinal hemorrhage, unspecified: Secondary | ICD-10-CM | POA: Insufficient documentation

## 2020-08-04 DIAGNOSIS — C787 Secondary malignant neoplasm of liver and intrahepatic bile duct: Secondary | ICD-10-CM | POA: Diagnosis not present

## 2020-08-04 DIAGNOSIS — E860 Dehydration: Secondary | ICD-10-CM | POA: Diagnosis not present

## 2020-08-04 DIAGNOSIS — E876 Hypokalemia: Secondary | ICD-10-CM | POA: Diagnosis not present

## 2020-08-04 DIAGNOSIS — Z79899 Other long term (current) drug therapy: Secondary | ICD-10-CM | POA: Insufficient documentation

## 2020-08-04 DIAGNOSIS — Z515 Encounter for palliative care: Secondary | ICD-10-CM | POA: Insufficient documentation

## 2020-08-04 DIAGNOSIS — Z7901 Long term (current) use of anticoagulants: Secondary | ICD-10-CM | POA: Insufficient documentation

## 2020-08-04 DIAGNOSIS — R200 Anesthesia of skin: Secondary | ICD-10-CM | POA: Diagnosis not present

## 2020-08-04 DIAGNOSIS — C180 Malignant neoplasm of cecum: Secondary | ICD-10-CM | POA: Insufficient documentation

## 2020-08-04 DIAGNOSIS — Z66 Do not resuscitate: Secondary | ICD-10-CM | POA: Insufficient documentation

## 2020-08-04 DIAGNOSIS — R000 Tachycardia, unspecified: Secondary | ICD-10-CM | POA: Insufficient documentation

## 2020-08-04 LAB — CBC WITH DIFFERENTIAL (CANCER CENTER ONLY)
Abs Immature Granulocytes: 0.05 10*3/uL (ref 0.00–0.07)
Basophils Absolute: 0.1 10*3/uL (ref 0.0–0.1)
Basophils Relative: 1 %
Eosinophils Absolute: 0.1 10*3/uL (ref 0.0–0.5)
Eosinophils Relative: 1 %
HCT: 30.6 % — ABNORMAL LOW (ref 36.0–46.0)
Hemoglobin: 10.3 g/dL — ABNORMAL LOW (ref 12.0–15.0)
Immature Granulocytes: 0 %
Lymphocytes Relative: 25 %
Lymphs Abs: 3.2 10*3/uL (ref 0.7–4.0)
MCH: 33 pg (ref 26.0–34.0)
MCHC: 33.7 g/dL (ref 30.0–36.0)
MCV: 98.1 fL (ref 80.0–100.0)
Monocytes Absolute: 0.9 10*3/uL (ref 0.1–1.0)
Monocytes Relative: 7 %
Neutro Abs: 8.4 10*3/uL — ABNORMAL HIGH (ref 1.7–7.7)
Neutrophils Relative %: 66 %
Platelet Count: 430 10*3/uL — ABNORMAL HIGH (ref 150–400)
RBC: 3.12 MIL/uL — ABNORMAL LOW (ref 3.87–5.11)
RDW: 13.9 % (ref 11.5–15.5)
WBC Count: 12.7 10*3/uL — ABNORMAL HIGH (ref 4.0–10.5)
nRBC: 0 % (ref 0.0–0.2)

## 2020-08-04 LAB — CMP (CANCER CENTER ONLY)
ALT: 11 U/L (ref 0–44)
AST: 35 U/L (ref 15–41)
Albumin: 2.5 g/dL — ABNORMAL LOW (ref 3.5–5.0)
Alkaline Phosphatase: 236 U/L — ABNORMAL HIGH (ref 38–126)
Anion gap: 11 (ref 5–15)
BUN: 5 mg/dL — ABNORMAL LOW (ref 6–20)
CO2: 33 mmol/L — ABNORMAL HIGH (ref 22–32)
Calcium: 6.5 mg/dL — ABNORMAL LOW (ref 8.9–10.3)
Chloride: 96 mmol/L — ABNORMAL LOW (ref 98–111)
Creatinine: 0.54 mg/dL (ref 0.44–1.00)
GFR, Estimated: >60 mL/min (ref >60)
Glucose, Bld: 79 mg/dL (ref 70–99)
Potassium: 2.5 mmol/L — CL (ref 3.5–5.1)
Sodium: 140 mmol/L (ref 135–145)
Total Bilirubin: 0.6 mg/dL (ref 0.3–1.2)
Total Protein: 5.4 g/dL — ABNORMAL LOW (ref 6.5–8.1)

## 2020-08-04 MED ORDER — PANTOPRAZOLE SODIUM 40 MG IV SOLR: 40.0000 mg | Freq: Once | INTRAVENOUS | Status: DC

## 2020-08-04 MED ORDER — SODIUM CHLORIDE 0.9 % IV SOLN
Freq: Once | INTRAVENOUS | Status: DC
  Filled 2020-08-04: qty 250

## 2020-08-04 MED ORDER — PANTOPRAZOLE SODIUM 40 MG PO TBEC: 40.0000 mg | DELAYED_RELEASE_TABLET | Freq: Two times a day (BID) | ORAL | 3 refills | Status: AC

## 2020-08-04 MED ORDER — POTASSIUM CHLORIDE 10 MEQ/100ML IV SOLN: 10.0000 meq | INTRAVENOUS | Status: DC

## 2020-08-04 MED ORDER — SODIUM CHLORIDE 0.9% FLUSH
10.0000 mL | Freq: Once | INTRAVENOUS | Status: AC | PRN
  Administered 2020-08-04: 10 mL
  Filled 2020-08-04: qty 10

## 2020-08-04 MED ORDER — FAMOTIDINE 200 MG/20ML IV SOLN
40.0000 mg | Freq: Once | INTRAVENOUS | Status: DC
Start: 2020-08-04 — End: 2020-08-04
  Administered 2020-08-04: 40 mg via INTRAVENOUS

## 2020-08-04 MED ORDER — SODIUM CHLORIDE 0.9 % IV SOLN
Freq: Once | INTRAVENOUS | Status: AC
  Filled 2020-08-04: qty 1000

## 2020-08-04 MED ORDER — SODIUM CHLORIDE 0.9 % IV SOLN
40.0000 mg | Freq: Once | INTRAVENOUS | Status: AC
  Filled 2020-08-04: qty 4

## 2020-08-04 MED ORDER — SODIUM CHLORIDE 0.9 % IV SOLN
INTRAVENOUS | Status: AC
  Filled 2020-08-04: qty 250

## 2020-08-04 MED ORDER — HEPARIN SOD (PORK) LOCK FLUSH 100 UNIT/ML IV SOLN
500.0000 [IU] | Freq: Once | INTRAVENOUS | Status: AC | PRN
  Administered 2020-08-04: 500 [IU]
  Filled 2020-08-04: qty 5

## 2020-08-04 MED ORDER — PANTOPRAZOLE SODIUM 40 MG IV SOLR
40.0000 mg | Freq: Once | INTRAVENOUS | Status: DC
  Filled 2020-08-04: qty 40

## 2020-08-04 NOTE — Patient Instructions (Signed)

## 2020-08-04 NOTE — Patient Instructions (Signed)

## 2020-08-04 NOTE — Addendum Note (Signed)
Addended by: Arlan Organ R on: 08/04/2020 03:47 PM   Modules accepted: Orders

## 2020-08-04 NOTE — Telephone Encounter (Signed)
Hospice referral made per Dr Myna Hidalgo request.  [facility].

## 2020-08-04 NOTE — Progress Notes (Signed)
Hematology and Oncology Follow Up Visit  Anita Hoffman [id number] 1961/05/01 60 y.o. 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 daily Udenycasq post chemo Eliquis 5 mg PO BID IV ironas indicated   Interim History:  Anita Hoffman is here today for follow-up.  Unfortunately, we are in a very bad way right now.  Her weight continues to decline quickly.  Today, she weighs 106 pounds.  She has lost almost 20 pounds in a month.  She says she is throwing up on occasion.  We did do a CT scan on her today.  To no surprise, this shows that she has progressive disease.  Her last CEA was up to 450.  I think that everything now is pointing to [place] stopping therapy and making sure that she has comfort.  She just is declining very quickly.  The weight loss is continual and will not abate.  I suspect that she probably will be below 100 pounds in a week or so.  She and I had a long talk this morning.  I explained to her what is going on.  I explained her that she has been on the most aggressive therapy that we have.  An additional therapy I think would just have very little chance of working and would have a high likelihood of making her worse.  I told her that we really need to focus on her quality of life now.  I think that the best way for [place] to do that is to get hospice involved.  I will speak to hospice.  I explained what hospice does.  We will try to keep her home.  If we can keep her home, then this would make life easier for her.  If we cannot keep her home, then I suspect that she probably would go to [facility].  We talked about end-of-life issues.  She does not want to be kept alive on a machine.  I totally agree with this.  I think she  would go on life support, she would never come off because she is so weak.  As such, she is a DO NOT RESUSCITATE.  I told her that I thought that she probably had less than 2 months.  Again the weight loss is the key.  Her albumin is less than 2.5.  I am sure her prealbumin is less than 10.  She is dehydrated.  Regarding have to give her fluids.  She is quite hypokalemic.  We will have to give her some IV potassium.  I do not think she is obstructed.  On her exam, she does not appear to be obstructed.  I know that she is trying hard.  She has done everything we have asked her to do.  I think we are just out of stage where we have to change the our philosophy and again focus on her quality of life and comfort.  I will have to call her mom who really has been involved with her health.  I am glad that she was able to have Christmas and New Year's with the family.  Her HIV has not been a problem from what she says.  She has been taking her HIV medication.  Currently, her performance status is ECOG 3.    Medications:  Allergies as of 08/04/2020  No Known Allergies     Medication List       Accurate as of August 04, 2020  9:57 AM. If you have any questions, ask your nurse or doctor.        apixaban 5 MG Tabs tablet Commonly known as: ELIQUIS Take 1 tablet (5 mg total) by mouth 2 (two) times daily.   Biktarvy 50-200-25 MG Tabs tablet Generic drug: bictegravir-emtricitabine-tenofovir AF Take 1 tablet by mouth daily.   dexamethasone 4 MG tablet Commonly known as: DECADRON Take 2 tablets (8 mg total) by mouth daily. Start the day after chemotherapy for 3 days. Take with food.   gabapentin 300 MG capsule Commonly known as: NEURONTIN Take 1 capsule (300 mg total) by mouth at bedtime.   magic mouthwash w/lidocaine Soln Take 5 mLs by mouth 4 (four) times daily as needed for mouth pain.   ondansetron 8 MG tablet Commonly known as: Zofran Take 1 tablet (8 mg total) by mouth 2 (two)  times daily as needed. Start on day 3 after chemotherapy.   pantoprazole 20 MG tablet Commonly known as: PROTONIX Take 1 tablet (20 mg total) by mouth daily.   potassium chloride SA 20 MEQ tablet Commonly known as: KLOR-CON Take 2 tablets (40 mEq total) by mouth 2 (two) times daily.       Allergies: No Known Allergies  Past Medical History, Surgical history, Social history, and Family History were reviewed and updated.  Review of Systems: Review of Systems  Constitutional: Positive for weight loss.  HENT: Negative.   Eyes: Negative.   Respiratory: Negative.   Cardiovascular: Negative.   Gastrointestinal: Negative.   Genitourinary: Negative.   Musculoskeletal: Negative.   Skin: Negative.   Neurological: Negative.   Endo/Heme/Allergies: Negative.   Psychiatric/Behavioral: Negative.      Physical Exam:  vitals were not taken for this visit.   Wt Readings from Last 3 Encounters:  08/04/20 106 lb 12.8 oz (48.4 kg)  07/23/20 106 lb (48.1 kg)  07/14/20 106 lb 1.6 oz (48.1 kg)  Vital signs show a temperature of 97.7.  Pulse 120.  Blood pressure 99/87.  Weight is 113 pounds.  Physical Exam Vitals reviewed.  HENT:     Head: Normocephalic and atraumatic.  Eyes:     Pupils: Pupils are equal, round, and reactive to light.  Cardiovascular:     Heart sounds: Normal heart sounds.     Comments: Cardiac exam is tachycardic but regular.  She has no murmurs, rubs or bruits. Pulmonary:     Effort: Pulmonary effort is normal.     Breath sounds: Normal breath sounds.  Abdominal:     General: Bowel sounds are normal.     Palpations: Abdomen is soft.  Musculoskeletal:        General: No tenderness or deformity. Normal range of motion.     Cervical back: Normal range of motion.  Lymphadenopathy:     Cervical: No cervical adenopathy.  Skin:    General: Skin is warm and dry.     Findings: No erythema or rash.  Neurological:     Mental Status: She is alert and oriented to  person, place, and time.  Psychiatric:        Behavior: Behavior normal.        Thought Content: Thought content normal.        Judgment: Judgment normal.      Lab Results  Component Value Date   WBC 12.7 (H) 08/04/2020   HGB 10.3 (L)  08/04/2020   HCT 30.6 (L) 08/04/2020   MCV 98.1 08/04/2020   PLT 430 (H) 08/04/2020   Lab Results  Component Value Date   FERRITIN 2,222 (H) 07/14/2020   IRON 48 07/14/2020   TIBC 144 (L) 07/14/2020   UIBC 96 (L) 07/14/2020   IRONPCTSAT 33 07/14/2020   Lab Results  Component Value Date   RBC 3.12 (L) 08/04/2020   No results found for: KPAFRELGTCHN, LAMBDASER, KAPLAMBRATIO No results found for: IGGSERUM, IGA, IGMSERUM No results found for: Kathrynn Ducking, MSPIKE, SPEI   Chemistry      Component Value Date/Time   NA 140 08/04/2020 0855   K 2.5 (LL) 08/04/2020 0855   CL 96 (L) 08/04/2020 0855   CO2 33 (H) 08/04/2020 0855   BUN 5 (L) 08/04/2020 0855   CREATININE 0.54 08/04/2020 0855   CREATININE 0.56 07/23/2020 1041      Component Value Date/Time   CALCIUM 6.5 (L) 08/04/2020 0855   ALKPHOS 236 (H) 08/04/2020 0855   AST 35 08/04/2020 0855   ALT 11 08/04/2020 0855   BILITOT 0.6 08/04/2020 0855       Impression and Plan: AnitaWilliamsis a verypleasant59yoAfrican American with metastatic colon cancer primarily ofthe cecum withextensive liver metastasis.  Again, she has progressive disease.  She has been on very aggressive therapy.  We have had to delay therapy on numerous occasions because of her weight loss and because of her quality of life and performance status.  Again, we will have to get Hospice involved.  I know that they will do a very good job with her.  We will see about getting her back on Protonix.  She is not on Protonix right now.  I will plan to see her back in another couple weeks just to see how things are going with her.  I really have hated to talk to her about  all this.  However, I promised her that I would be honest with her whether it was good news or bad news.  I want her to know where she stands with her cancer.  This is out of respect to her as one of our family members.    I know that she does have a lot of faith.  She is well aware that the good Reita Cliche will take her home and she will not have cancer or HIV any longer.   Volanda Napoleon, MD 1/3/20229:57 AM

## 2020-08-04 NOTE — Telephone Encounter (Signed)
Potassium 2.5 this morning reported by Brett Canales in lab.  Dr Myna Hidalgo aware.  Seeing patient in exam room today.

## 2020-08-05 ENCOUNTER — Telehealth: Payer: Self-pay

## 2020-08-05 ENCOUNTER — Other Ambulatory Visit: Payer: Self-pay | Admitting: Hematology & Oncology

## 2020-08-05 LAB — COMPLETE METABOLIC PANEL WITH GFR
AG Ratio: 1 (calc) (ref 1.0–2.5)
ALT: 13 U/L (ref 6–29)
AST: 31 U/L (ref 10–35)
Albumin: 2.8 g/dL — ABNORMAL LOW (ref 3.6–5.1)
Alkaline phosphatase (APISO): 189 U/L — ABNORMAL HIGH (ref 37–153)
BUN/Creatinine Ratio: 11 (calc) (ref 6–22)
BUN: 6 mg/dL — ABNORMAL LOW (ref 7–25)
CO2: 31 mmol/L (ref 20–32)
Calcium: 6.7 mg/dL — ABNORMAL LOW (ref 8.6–10.4)
Chloride: 96 mmol/L — ABNORMAL LOW (ref 98–110)
Creat: 0.56 mg/dL (ref 0.50–1.05)
GFR, Est African American: 119 mL/min/{1.73_m2} (ref >=60)
GFR, Est Non African American: 103 mL/min/{1.73_m2} (ref >=60)
Globulin: 2.9 g/dL (calc) (ref 1.9–3.7)
Glucose, Bld: 82 mg/dL (ref 65–99)
Potassium: 3.5 mmol/L (ref 3.5–5.3)
Sodium: 140 mmol/L (ref 135–146)
Total Bilirubin: 0.6 mg/dL (ref 0.2–1.2)
Total Protein: 5.7 g/dL — ABNORMAL LOW (ref 6.1–8.1)

## 2020-08-05 LAB — CBC WITH DIFFERENTIAL/PLATELET
Absolute Monocytes: 1122 cells/uL — ABNORMAL HIGH (ref 200–950)
Basophils Absolute: 47 cells/uL (ref 0–200)
Basophils Relative: 0.3 %
Eosinophils Absolute: 16 cells/uL (ref 15–500)
Eosinophils Relative: 0.1 %
HCT: 32.9 % — ABNORMAL LOW (ref 35.0–45.0)
Hemoglobin: 11.3 g/dL — ABNORMAL LOW (ref 11.7–15.5)
Lymphs Abs: 3634 cells/uL (ref 850–3900)
MCH: 33.3 pg — ABNORMAL HIGH (ref 27.0–33.0)
MCHC: 34.3 g/dL (ref 32.0–36.0)
MCV: 97.1 fL (ref 80.0–100.0)
MPV: 9.9 fL (ref 7.5–12.5)
Monocytes Relative: 7.1 %
Neutro Abs: 10981 cells/uL — ABNORMAL HIGH (ref 1500–7800)
Neutrophils Relative %: 69.5 %
Platelets: 367 10*3/uL (ref 140–400)
RBC: 3.39 10*6/uL — ABNORMAL LOW (ref 3.80–5.10)
RDW: 12.8 % (ref 11.0–15.0)
Total Lymphocyte: 23 %
WBC: 15.8 10*3/uL — ABNORMAL HIGH (ref 3.8–10.8)

## 2020-08-05 LAB — HIV-1 RNA QUANT-NO REFLEX-BLD
HIV 1 RNA Quant: <20 Copies/mL
HIV-1 RNA Quant, Log: <1.3 Log cps/mL

## 2020-08-05 LAB — RPR: RPR Ser Ql: NONREACTIVE

## 2020-08-05 MED ORDER — DRONABINOL 2.5 MG PO CAPS: 2.5000 mg | ORAL_CAPSULE | Freq: Two times a day (BID) | ORAL | 0 refills | Status: AC

## 2020-08-05 NOTE — Telephone Encounter (Signed)
Called and left a vm with 08/19/20 appts per 08/04/20 los     aom

## 2020-08-06 ENCOUNTER — Inpatient Hospital Stay: Payer: Medicaid Other

## 2020-08-07 ENCOUNTER — Telehealth: Payer: Self-pay | Admitting: *Deleted

## 2020-08-07 DIAGNOSIS — G6289 Other specified polyneuropathies: Secondary | ICD-10-CM

## 2020-08-07 DIAGNOSIS — C180 Malignant neoplasm of cecum: Secondary | ICD-10-CM

## 2020-08-07 DIAGNOSIS — C787 Secondary malignant neoplasm of liver and intrahepatic bile duct: Secondary | ICD-10-CM

## 2020-08-07 MED ORDER — GABAPENTIN 300 MG PO CAPS
600.0000 mg | ORAL_CAPSULE | Freq: Every day | ORAL | 1 refills | Status: AC
Start: 2020-08-07 — End: ?

## 2020-08-07 NOTE — Telephone Encounter (Signed)
Call received from Gerrit Friends with [facility] stating that Neurontin 300 mg is not helping pt.'s peripheral neuropathy and would like to know if Neurontin should be increased or changed.  Dr. Myna Hidalgo notified and order received for pt to increase Neurontin to 600 mg QD at bedtime.  Call placed back to Lupita Leash to notify her of MD orders.  Lupita Leash is appreciative of call back and has no further questions at this time.

## 2020-08-19 ENCOUNTER — Other Ambulatory Visit: Payer: Medicaid Other

## 2020-08-19 ENCOUNTER — Ambulatory Visit: Payer: Medicaid Other

## 2020-08-19 ENCOUNTER — Ambulatory Visit: Payer: Medicaid Other | Admitting: Hematology & Oncology

## 2020-08-20 ENCOUNTER — Other Ambulatory Visit: Payer: Self-pay

## 2020-08-20 ENCOUNTER — Inpatient Hospital Stay: Payer: Medicaid Other

## 2020-08-20 ENCOUNTER — Encounter: Payer: Self-pay | Admitting: Hematology & Oncology

## 2020-08-20 ENCOUNTER — Inpatient Hospital Stay (HOSPITAL_BASED_OUTPATIENT_CLINIC_OR_DEPARTMENT_OTHER): Payer: Medicaid Other | Admitting: Hematology & Oncology

## 2020-08-20 ENCOUNTER — Telehealth: Payer: Self-pay

## 2020-08-20 ENCOUNTER — Ambulatory Visit: Payer: Medicaid Other

## 2020-08-20 VITALS — Wt 107.0 lb

## 2020-08-20 DIAGNOSIS — C180 Malignant neoplasm of cecum: Secondary | ICD-10-CM | POA: Diagnosis not present

## 2020-08-20 DIAGNOSIS — C787 Secondary malignant neoplasm of liver and intrahepatic bile duct: Secondary | ICD-10-CM

## 2020-08-20 LAB — CBC WITH DIFFERENTIAL (CANCER CENTER ONLY)
Abs Immature Granulocytes: 0.11 10*3/uL — ABNORMAL HIGH (ref 0.00–0.07)
Basophils Absolute: 0 10*3/uL (ref 0.0–0.1)
Basophils Relative: 0 %
Eosinophils Absolute: 0 10*3/uL (ref 0.0–0.5)
Eosinophils Relative: 0 %
HCT: 30.2 % — ABNORMAL LOW (ref 36.0–46.0)
Hemoglobin: 10.3 g/dL — ABNORMAL LOW (ref 12.0–15.0)
Immature Granulocytes: 1 %
Lymphocytes Relative: 18 %
Lymphs Abs: 2.9 10*3/uL (ref 0.7–4.0)
MCH: 33.1 pg (ref 26.0–34.0)
MCHC: 34.1 g/dL (ref 30.0–36.0)
MCV: 97.1 fL (ref 80.0–100.0)
Monocytes Absolute: 0.9 10*3/uL (ref 0.1–1.0)
Monocytes Relative: 6 %
Neutro Abs: 12.1 10*3/uL — ABNORMAL HIGH (ref 1.7–7.7)
Neutrophils Relative %: 75 %
Platelet Count: 313 10*3/uL (ref 150–400)
RBC: 3.11 MIL/uL — ABNORMAL LOW (ref 3.87–5.11)
RDW: 15.7 % — ABNORMAL HIGH (ref 11.5–15.5)
WBC Count: 16 10*3/uL — ABNORMAL HIGH (ref 4.0–10.5)
nRBC: 0 % (ref 0.0–0.2)

## 2020-08-20 LAB — CMP (CANCER CENTER ONLY)
ALT: 16 U/L (ref 0–44)
AST: 47 U/L — ABNORMAL HIGH (ref 15–41)
Albumin: 2.5 g/dL — ABNORMAL LOW (ref 3.5–5.0)
Alkaline Phosphatase: 304 U/L — ABNORMAL HIGH (ref 38–126)
Anion gap: 11 (ref 5–15)
BUN: 6 mg/dL (ref 6–20)
CO2: 32 mmol/L (ref 22–32)
Calcium: 7.8 mg/dL — ABNORMAL LOW (ref 8.9–10.3)
Chloride: 95 mmol/L — ABNORMAL LOW (ref 98–111)
Creatinine: 0.52 mg/dL (ref 0.44–1.00)
GFR, Estimated: >60 mL/min (ref >60)
Glucose, Bld: 92 mg/dL (ref 70–99)
Potassium: 2.8 mmol/L — ABNORMAL LOW (ref 3.5–5.1)
Sodium: 138 mmol/L (ref 135–145)
Total Bilirubin: 1.1 mg/dL (ref 0.3–1.2)
Total Protein: 5.7 g/dL — ABNORMAL LOW (ref 6.5–8.1)

## 2020-08-20 NOTE — Progress Notes (Signed)
Hematology and Oncology Follow Up Visit  Anita Hoffman [id number] [birth year] 60 y.o. 08/20/2020   Principle Diagnosis:  Metastatic adenocarcinoma of the cecum-liver metastasis Pulmonary emboli and LEFT gastrocnemius vein thrombus HIV-asymptomatic Iron deficiency secondary to GI bleeding  Current Therapy: FOLFOXIRI - startedon 12/12/2019, s/p cycle#7 Biktarvy 1 p.o. daily Udenycasq post chemo Eliquis 5 mg PO BID IV ironas indicated   Interim History:  [name] is here today for follow-up.  She comes in with her mom.  She now is on hospice.  She is holding her own right now which is nice to see.  She is having some discomfort in the right upper quadrant which I am sure is from her liver metastasis.  She was put on some pain medication by hospice which she says helps.  Her weight is holding steady.  She is eating about the same.  There is no nausea or vomiting.  She is not have any diarrhea.  She is having no obvious bleeding.  There is been no headache.  She does not feel that she needs any IV fluids today.  She does have the numbness in her feet.  I am sure this is from chemotherapy.  Unfortunately, she cannot be put on gabapentin.  She says hospice does not like this.  She has had no actual headache.  I am glad that her weight is up 1 pound since we last saw her.  This I think is somewhat encouraging.  Her last CEA level was 480.  Overall, I would say performance status is ECOG 3.    Medications:  Allergies as of 08/20/2020   No Known Allergies     Medication List       Accurate as of August 20, 2020 12:12 PM. If you have any questions, ask your nurse or doctor.        apixaban 5 MG Tabs tablet Commonly known as: ELIQUIS Take 1 tablet (5 mg total) by mouth 2 (two) times daily.   Biktarvy 50-200-25 MG Tabs tablet Generic drug: bictegravir-emtricitabine-tenofovir AF Take 1 tablet by mouth daily.   dronabinol 2.5 MG capsule Commonly known  as: MARINOL Take 1 capsule (2.5 mg total) by mouth 2 (two) times daily before a meal.   gabapentin 300 MG capsule Commonly known as: NEURONTIN Take 2 capsules (600 mg total) by mouth at bedtime.   magic mouthwash w/lidocaine Soln Take 5 mLs by mouth 4 (four) times daily as needed for mouth pain.   ondansetron 4 MG tablet Commonly known as: ZOFRAN Take 4-8 mg by mouth every 8 (eight) hours as needed.   oxyCODONE 5 MG immediate release tablet Commonly known as: Oxy IR/ROXICODONE Take 5 mg by mouth every 4 (four) hours as needed.   pantoprazole 40 MG tablet Commonly known as: PROTONIX Take 1 tablet (40 mg total) by mouth 2 (two) times daily.   potassium chloride SA 20 MEQ tablet Commonly known as: KLOR-CON Take 2 tablets (40 mEq total) by mouth 2 (two) times daily.   Senna S 8.6-50 MG tablet Generic drug: senna-docusate SMARTSIG:2-4 Tablet(s) By Mouth Daily PRN       Allergies: No Known Allergies  Past Medical History, Surgical history, Social history, and Family History were reviewed and updated.  Review of Systems: Review of Systems  Constitutional: Positive for weight loss.  HENT: Negative.   Eyes: Negative.   Respiratory: Negative.   Cardiovascular: Negative.   Gastrointestinal: Negative.   Genitourinary: Negative.   Musculoskeletal: Negative.   Skin: Negative.  Neurological: Negative.   Endo/Heme/Allergies: Negative.   Psychiatric/Behavioral: Negative.      Physical Exam:  weight is 107 lb (48.5 kg).   Wt Readings from Last 3 Encounters:  08/20/20 107 lb (48.5 kg)  08/04/20 106 lb 12.8 oz (48.4 kg)  07/23/20 106 lb (48.1 kg)  Vital signs show a temperature of 97.7.  Pulse 120.  Blood pressure 99/87.  Weight is 113 pounds.  Physical Exam Vitals reviewed.  HENT:     Head: Normocephalic and atraumatic.  Eyes:     Pupils: Pupils are equal, round, and reactive to light.  Cardiovascular:     Heart sounds: Normal heart sounds.     Comments: Cardiac  exam is tachycardic but regular.  She has no murmurs, rubs or bruits. Pulmonary:     Effort: Pulmonary effort is normal.     Breath sounds: Normal breath sounds.  Abdominal:     General: Bowel sounds are normal.     Palpations: Abdomen is soft.  Musculoskeletal:        General: No tenderness or deformity. Normal range of motion.     Cervical back: Normal range of motion.  Lymphadenopathy:     Cervical: No cervical adenopathy.  Skin:    General: Skin is warm and dry.     Findings: No erythema or rash.  Neurological:     Mental Status: She is alert and oriented to person, place, and time.  Psychiatric:        Behavior: Behavior normal.        Thought Content: Thought content normal.        Judgment: Judgment normal.      Lab Results  Component Value Date   WBC 16.0 (H) 08/20/2020   HGB 10.3 (L) 08/20/2020   HCT 30.2 (L) 08/20/2020   MCV 97.1 08/20/2020   PLT 313 08/20/2020   Lab Results  Component Value Date   FERRITIN 2,222 (H) 07/14/2020   IRON 48 07/14/2020   TIBC 144 (L) 07/14/2020   UIBC 96 (L) 07/14/2020   IRONPCTSAT 33 07/14/2020   Lab Results  Component Value Date   RBC 3.11 (L) 08/20/2020   No results found for: KPAFRELGTCHN, LAMBDASER, KAPLAMBRATIO No results found for: IGGSERUM, IGA, IGMSERUM No results found for: TOTALPROTELP, ALBUMINELP, A1GS, A2GS, Anita Hoffman Baldy, MSPIKE, SPEI   Chemistry      Component Value Date/Time   NA 138 08/20/2020 1140   K 2.8 (L) 08/20/2020 1140   CL 95 (L) 08/20/2020 1140   CO2 32 08/20/2020 1140   BUN 6 08/20/2020 1140   CREATININE 0.52 08/20/2020 1140   CREATININE 0.56 07/23/2020 1041      Component Value Date/Time   CALCIUM 7.8 (L) 08/20/2020 1140   ALKPHOS 304 (H) 08/20/2020 1140   AST 47 (H) 08/20/2020 1140   ALT 16 08/20/2020 1140   BILITOT 1.1 08/20/2020 1140       Impression and Plan: Ms.Williamsis a verypleasant59yoAfrican American with metastatic colon cancer primarily ofthe cecum  withextensive liver metastasis.  Again, she has progressive disease.  She has been on very aggressive therapy.  We have had to delay therapy on numerous occasions because of her weight loss and because of her quality of life and performance status.  At this point, she is clearly comfort care.  We want to make sure that we try to palliate symptoms.  I am glad that her weight is not lower.  Is this will certainly tell us how she is doing.  Her albumin is about the same which is also encouraging.  I told her to take some extra potassium at home.  I told her to take 2 pills twice a day for 4 days and then go to 1 pill twice a day.  This may help with the numbness in her feet.  I suspect the numbness is probably secondary to chemotherapy.  I still would like to follow her along.  I will plan to see her back in another 3 weeks or so.  Again she does not think she needs any IV fluids.   Volanda Napoleon, MD 1/19/202212:12 PM

## 2020-08-20 NOTE — Patient Instructions (Signed)

## 2020-08-20 NOTE — Telephone Encounter (Signed)
appts made per 1=19 los, pt to view on my chart and a vm was left with apt information, I also cx the 1-24 appt     Travin Marik

## 2020-08-21 ENCOUNTER — Other Ambulatory Visit: Payer: Medicaid Other

## 2020-08-21 ENCOUNTER — Ambulatory Visit: Payer: Medicaid Other

## 2020-08-21 ENCOUNTER — Ambulatory Visit: Payer: Medicaid Other | Admitting: Hematology & Oncology

## 2020-08-25 ENCOUNTER — Inpatient Hospital Stay: Payer: Medicaid Other | Admitting: Hematology & Oncology

## 2020-08-25 ENCOUNTER — Inpatient Hospital Stay: Payer: Medicaid Other

## 2020-08-25 ENCOUNTER — Ambulatory Visit: Payer: Medicaid Other

## 2020-09-04 ENCOUNTER — Encounter: Payer: Self-pay | Admitting: *Deleted

## 2020-09-10 ENCOUNTER — Inpatient Hospital Stay: Payer: Medicaid Other

## 2020-09-10 ENCOUNTER — Inpatient Hospital Stay: Payer: Medicaid Other | Admitting: Hematology & Oncology

## 2020-09-10 ENCOUNTER — Inpatient Hospital Stay: Payer: Medicaid Other | Attending: Hematology & Oncology

## 2020-09-11 ENCOUNTER — Telehealth: Payer: Self-pay

## 2020-09-11 NOTE — Telephone Encounter (Signed)
Called and left a vm to r/s 09/10/20 missed appts if needed per inbasket messasge     Anita Hoffman

## 2020-09-12 ENCOUNTER — Telehealth: Payer: Self-pay | Admitting: *Deleted

## 2020-09-12 NOTE — Telephone Encounter (Signed)
Call received from patient's mother, Manuela Schwartz stating that pt is having difficulties ambulating and will not be able to make her appts at this time.  Manuela Schwartz states that hospice is coming to assist patient and that patient is being well taken care of by them.  Instructed Manuela Schwartz to please call this office with any questions or concerns regarding patient.

## 2020-09-29 ENCOUNTER — Encounter: Payer: Self-pay | Admitting: *Deleted

## 2020-09-29 NOTE — Progress Notes (Signed)
Fax received from [facility] stating that pt passed away on 14-Oct-2020 at 1440.  Dr. Marin Olp notified.

## 2020-09-30 DEATH — deceased

## 2020-10-29 ENCOUNTER — Ambulatory Visit: Payer: Medicaid Other | Admitting: Infectious Disease

## 2020-11-01 IMAGING — CT CT ANGIO CHEST
2 of 6 series · 16 of 36 positions shown · IV contrast (omnipaque)
Comparison: 11/29/2019

CLINICAL DATA: Colorectal carcinoma. Evaluate for pulmonary
embolus.

EXAM:
CT ANGIOGRAPHY CHEST
CT ABDOMEN AND PELVIS WITH CONTRAST
TECHNIQUE: Multidetector CT imaging of the chest was performed using the
standard protocol during bolus administration of intravenous
contrast. Multiplanar CT image reconstructions and MIPs were
obtained to evaluate the vascular anatomy. Multidetector CT imaging
of the abdomen and pelvis was performed using the standard protocol
during bolus administration of intravenous contrast.
CONTRAST:  100mL OMNIPAQUE IOHEXOL 350 MG/ML SOLN

[Series 4: pe thins · axial · 0.63mm/px · z∈[-294,-13]mm · 15 of 313 slices shown]
[im 16/313  lung]
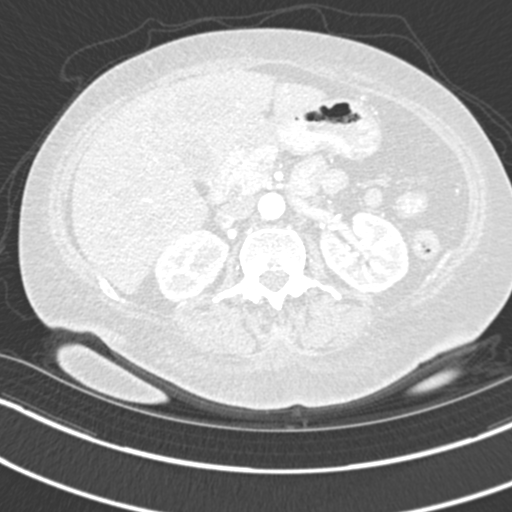
[im 32/313  mediastinal]
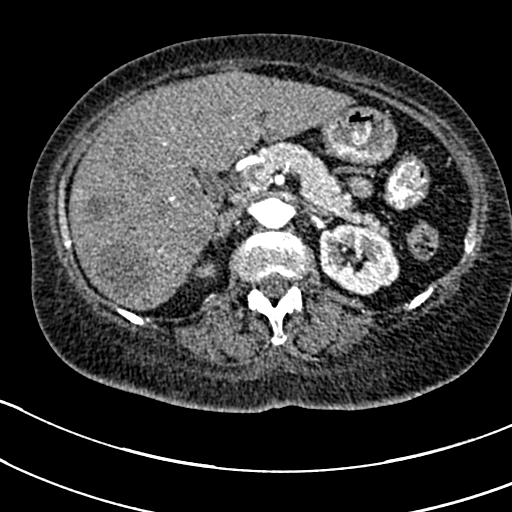
[im 63/313  lung]
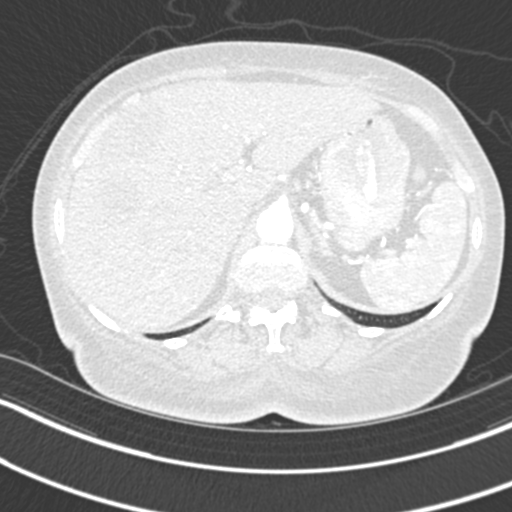
[im 79/313  mediastinal]
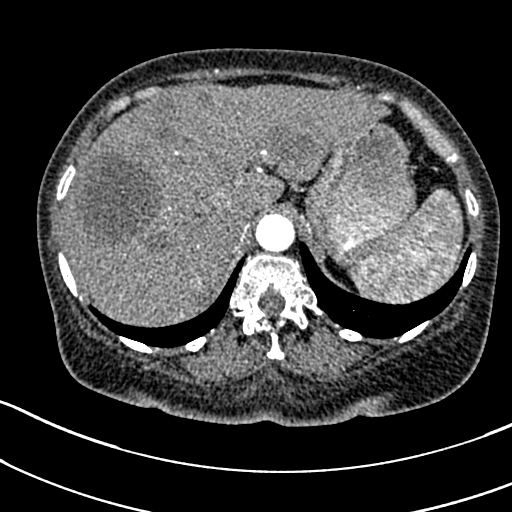
[im 94/313  lung]
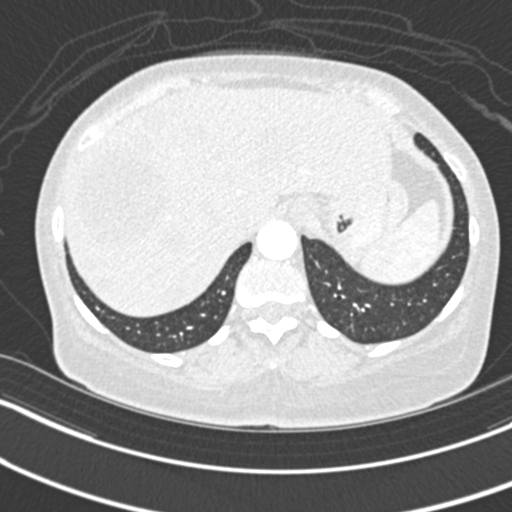
[im 110/313  mediastinal]
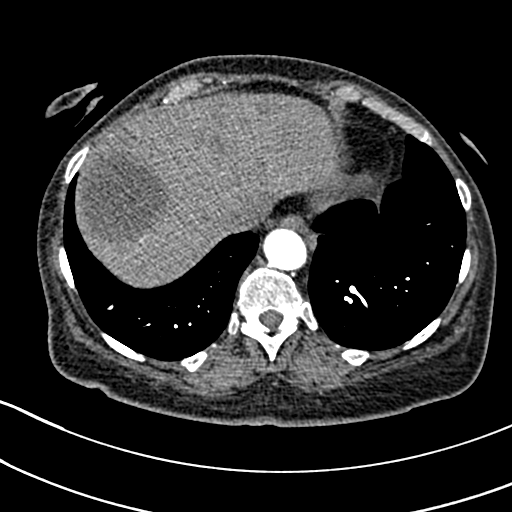
[im 141/313  lung]
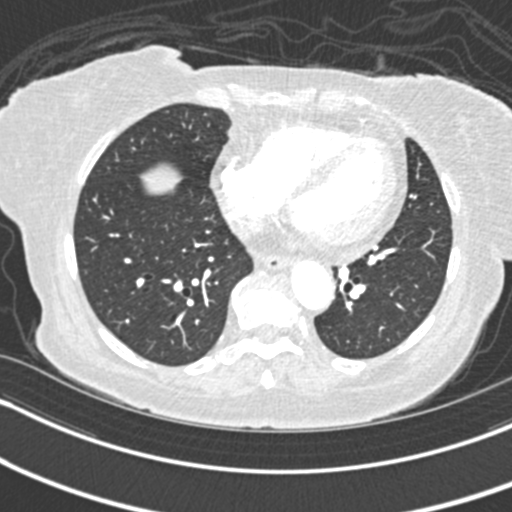
[im 157/313  mediastinal]
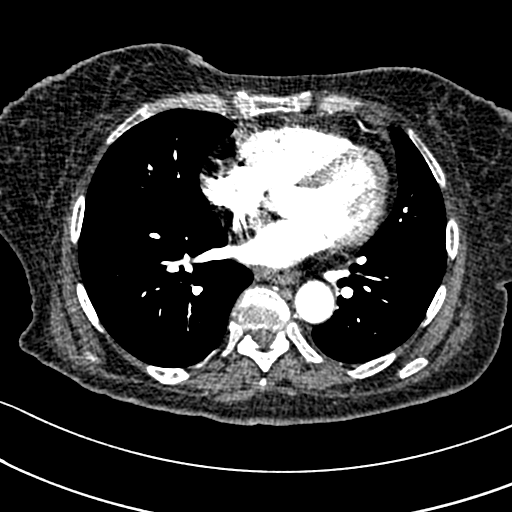
[im 172/313  lung]
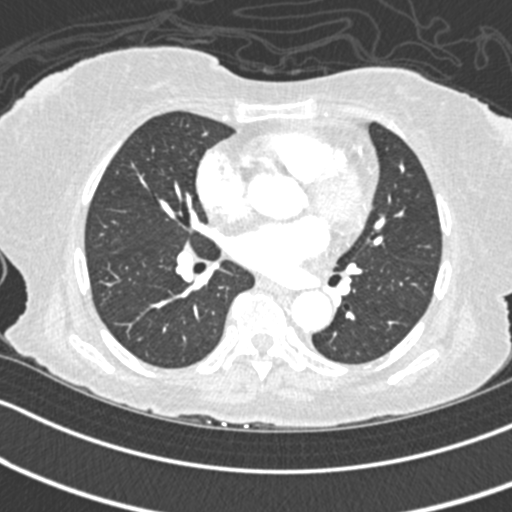
[im 203/313  mediastinal]
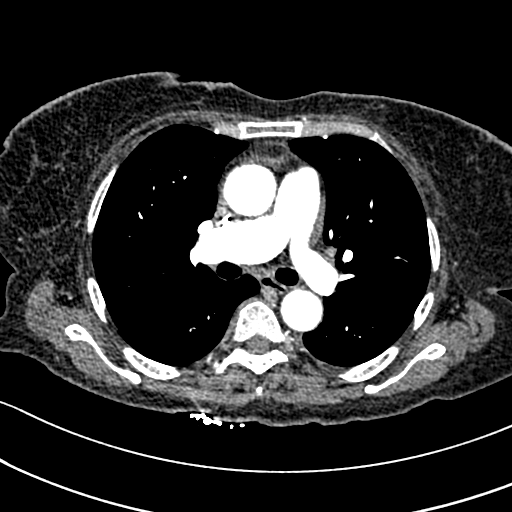
[im 219/313  lung]
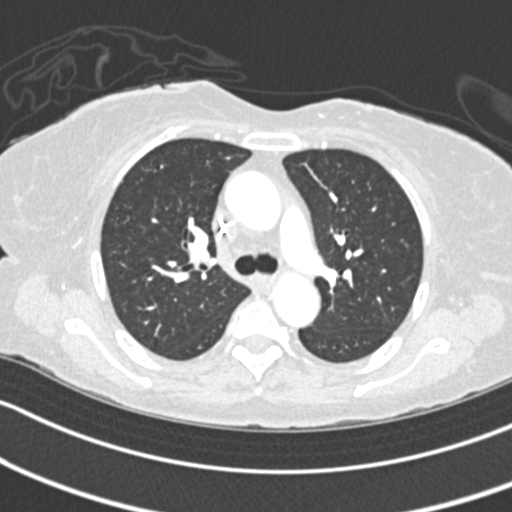
[im 235/313  mediastinal]
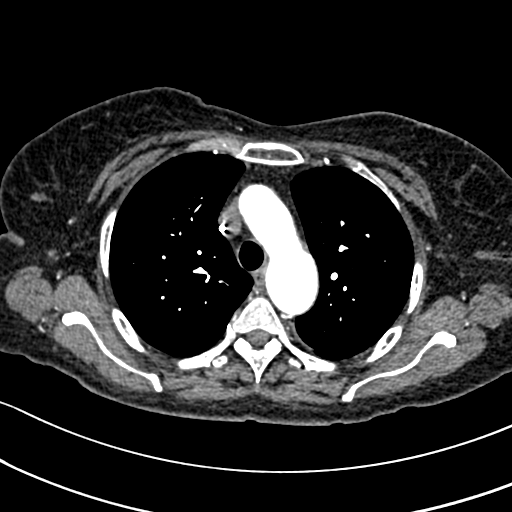
[im 250/313  lung]
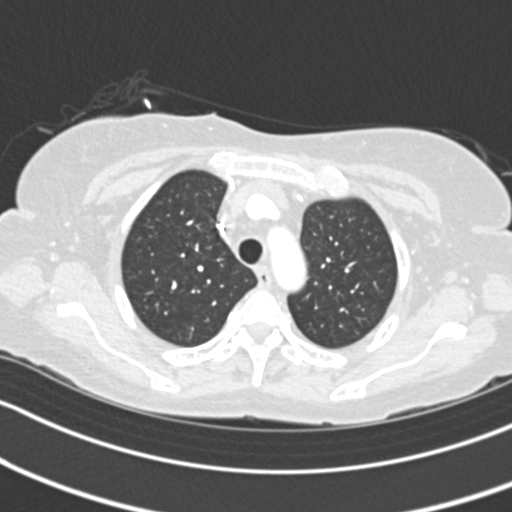
[im 281/313  mediastinal]
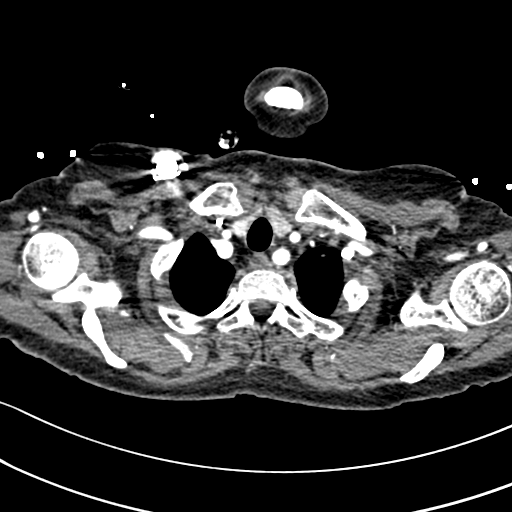
[im 297/313  lung]
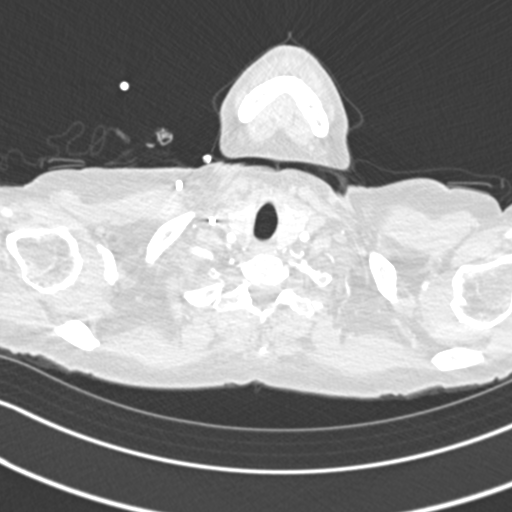

[Series 5: pe coronal mpr · coronal · 0.61mm/px · 1 of 129 slices shown]
[im 65/129  mediastinal]
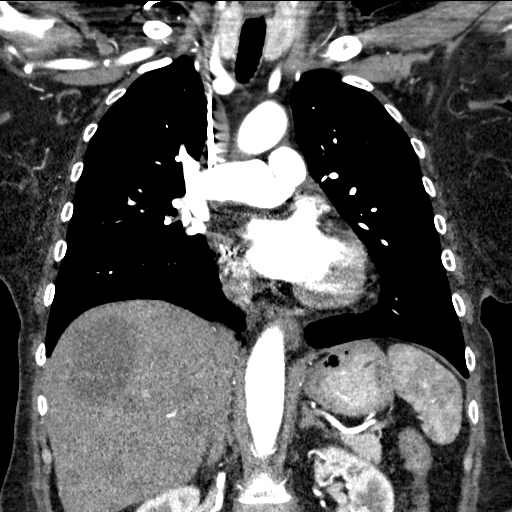

[16 of 36 positions shown; findings below may reference images not displayed]

FINDINGS: CTA CHEST FINDINGS

Cardiovascular: No findings of acute pulmonary embolus. Linear, web
like filling defect within posterior segmental branch of right lower
lobe pulmonary artery noted compatible with residual, chronic
pulmonary embolus. When compared with the previous exam the overall
volume of pulmonary embolus is significantly improved.

Normal heart size.  No pericardial effusion.

Mediastinum/Nodes: 1.1 cm left lobe of thyroid gland nodule
identified, image [DATE]. Not clinically significant; no follow-up
imaging recommended (ref: [HOSPITAL]. [DATE]): 143-50).
The trachea appears patent and is midline. Normal appearance of the
esophagus. No supraclavicular, axillary, mediastinal, or hilar
adenopathy.

Lungs/Pleura: No pleural effusion. No airspace consolidation,
atelectasis, or pneumothorax. No suspicious lung nodules.

Musculoskeletal: No chest wall abnormality. No acute or significant
osseous findings.

Review of the MIP images confirms the above findings.

CT ABDOMEN and PELVIS FINDINGS

Hepatobiliary: Multiple liver metastases are again identified.
Dominant lesion within segment [DATE] measures 6.4 x 5.7 cm, image
[DATE]. This is compared with 6.8 x 7.2 cm. Index lesion within
segment 3 measures 5.3 x 2.6 cm, image 32/4. Previously 7.3 x
cm. Index lesion within posterior right hepatic lobe measures 4.1 by
3.8 cm, image [DATE]. Previously 5.6 x 5.2 cm.

Gallbladder unremarkable.  No bile duct dilatation

Pancreas: Unremarkable. No pancreatic ductal dilatation or
surrounding inflammatory changes.

Spleen: Normal in size without focal abnormality.

Adrenals/Urinary Tract: Normal appearance of the adrenal glands.
Bilateral kidney cysts. No hydronephrosis identified.

Stomach/Bowel: Stomach is nondistended. Mass involving the cecum and
extending to involve the ileocecal valve is again noted. Best seen
on the coronal images this measures 3.5 x 2.4 cm, image 33/8. This
is compared with 5.0 x 4.1 cm previously. There is no obstruction.

Vascular/Lymphatic: No abdominal aortic aneurysm. Central mesenteric
lymph node measures 3 x 2.4 cm, image 45/4. This is compared with
3.9 x 2.8 cm previously. Ileocolic lymph node measures 1.7 x 1.5 cm,
image 49/4. Previously 1.7 x 1.5 cm. No retroperitoneal adenopathy.
No pelvic or inguinal adenopathy.

Reproductive: Uterine fibroid is identified within the right side of
fundus measuring 2.7 cm. No suspicious adnexal mass.

Other: No ascites.  No focal fluid collections.

Musculoskeletal: Within the ventral abdominal wall there is been
interval development of several soft tissue nodules within the
subcutaneous fat. Index nodule measures 1.8 x 1.4 cm, image 40/4.
New from previous exam. Within the left lower quadrant ventral
abdominal wall there is a subcutaneous nodule measuring 1.7 x
cm, image 46/4. Also new. Additional smaller subcutaneous nodules
are noted bilaterally within the subcutaneous fat of the ventral
abdominal wall.

No acute or aggressive osseous findings

Review of the MIP images confirms the above findings.
IMPRESSION: 1. No evidence for acute pulmonary embolus. Linear, web like filling
defect within posterior segmental branch of right lower lobe
pulmonary artery is noted compatible with residual, chronic
pulmonary embolus. When compared with previous exam the overall
volume of pulmonary embolus is significantly improved from previous
exam.
2. Interval improvement in multifocal liver metastases. Mild
decrease in size of central mesenteric nodal metastases. The primary
lesion involving the cecum and terminal ileum is also decreased in
size in the interval.
3. Multiple soft tissue nodules are identified within the
subcutaneous fat of the ventral abdominal wall. This is new from
previous exam. Primary differential consideration includes injection
granulomas versus soft tissue metastasis. Clinical correlation
advised.

## 2021-11-11 ENCOUNTER — Other Ambulatory Visit: Payer: Self-pay
